# Patient Record
Sex: Female | Born: 1935 | Race: White | Hispanic: No | Marital: Married | Smoking: Never smoker
Health system: Southern US, Community
[De-identification: ages and names within clinical notes are randomized; demographics above are authoritative.]

## PROBLEM LIST (undated history)

## (undated) DIAGNOSIS — K449 Diaphragmatic hernia without obstruction or gangrene: Secondary | ICD-10-CM

## (undated) DIAGNOSIS — M545 Low back pain: Secondary | ICD-10-CM

## (undated) DIAGNOSIS — I10 Essential (primary) hypertension: Secondary | ICD-10-CM

## (undated) DIAGNOSIS — F0390 Unspecified dementia without behavioral disturbance: Secondary | ICD-10-CM

## (undated) DIAGNOSIS — Z950 Presence of cardiac pacemaker: Secondary | ICD-10-CM

## (undated) DIAGNOSIS — E785 Hyperlipidemia, unspecified: Secondary | ICD-10-CM

## (undated) DIAGNOSIS — G8929 Other chronic pain: Secondary | ICD-10-CM

## (undated) DIAGNOSIS — I4891 Unspecified atrial fibrillation: Secondary | ICD-10-CM

## (undated) DIAGNOSIS — N2889 Other specified disorders of kidney and ureter: Secondary | ICD-10-CM

## (undated) DIAGNOSIS — I499 Cardiac arrhythmia, unspecified: Secondary | ICD-10-CM

## (undated) DIAGNOSIS — Z972 Presence of dental prosthetic device (complete) (partial): Secondary | ICD-10-CM

## (undated) DIAGNOSIS — K219 Gastro-esophageal reflux disease without esophagitis: Secondary | ICD-10-CM

## (undated) DIAGNOSIS — M1990 Unspecified osteoarthritis, unspecified site: Secondary | ICD-10-CM

## (undated) DIAGNOSIS — R569 Unspecified convulsions: Secondary | ICD-10-CM

## (undated) DIAGNOSIS — M419 Scoliosis, unspecified: Secondary | ICD-10-CM

## (undated) DIAGNOSIS — Z973 Presence of spectacles and contact lenses: Secondary | ICD-10-CM

## (undated) DIAGNOSIS — K08109 Complete loss of teeth, unspecified cause, unspecified class: Secondary | ICD-10-CM

## (undated) HISTORY — DX: Low back pain: M54.5

## (undated) HISTORY — DX: Hyperlipidemia, unspecified: E78.5

## (undated) HISTORY — PX: SHOULDER ARTHROSCOPY: SHX128

## (undated) HISTORY — PX: ABDOMINAL HYSTERECTOMY: SHX81

## (undated) HISTORY — DX: Essential (primary) hypertension: I10

## (undated) HISTORY — PX: BACK SURGERY: SHX140

## (undated) HISTORY — PX: LUMBAR LAMINECTOMY/DECOMPRESSION MICRODISCECTOMY: SHX5026

## (undated) HISTORY — PX: EYE SURGERY: SHX253

## (undated) HISTORY — PX: APPENDECTOMY: SHX54

## (undated) HISTORY — DX: Other specified disorders of kidney and ureter: N28.89

## (undated) HISTORY — PX: TONSILLECTOMY: SUR1361

## (undated) HISTORY — PX: HEEL SPUR SURGERY: SHX665

## (undated) HISTORY — DX: Unspecified atrial fibrillation: I48.91

## (undated) HISTORY — PX: SPINAL GROWTH RODS: SHX2427

## (undated) HISTORY — PX: HERNIA REPAIR: SHX51

## (undated) HISTORY — DX: Low back pain, unspecified: G89.29

## (undated) HISTORY — PX: KNEE ARTHROSCOPY: SUR90

---

## 1999-11-20 ENCOUNTER — Encounter: Admission: RE | Admit: 1999-11-20 | Discharge: 1999-11-20 | Payer: Self-pay | Admitting: Orthopedic Surgery

## 1999-11-20 ENCOUNTER — Encounter: Payer: Self-pay | Admitting: Orthopedic Surgery

## 2000-06-16 ENCOUNTER — Encounter: Payer: Self-pay | Admitting: Family Medicine

## 2000-06-16 ENCOUNTER — Encounter: Admission: RE | Admit: 2000-06-16 | Discharge: 2000-06-16 | Payer: Self-pay | Admitting: Family Medicine

## 2001-06-17 ENCOUNTER — Encounter: Admission: RE | Admit: 2001-06-17 | Discharge: 2001-06-17 | Payer: Self-pay | Admitting: Family Medicine

## 2001-06-17 ENCOUNTER — Encounter: Payer: Self-pay | Admitting: Family Medicine

## 2002-05-04 ENCOUNTER — Other Ambulatory Visit: Admission: RE | Admit: 2002-05-04 | Discharge: 2002-05-04 | Payer: Self-pay | Admitting: Family Medicine

## 2002-05-07 ENCOUNTER — Encounter: Admission: RE | Admit: 2002-05-07 | Discharge: 2002-05-07 | Payer: Self-pay | Admitting: Family Medicine

## 2002-05-07 ENCOUNTER — Encounter: Payer: Self-pay | Admitting: Family Medicine

## 2002-07-06 ENCOUNTER — Encounter: Admission: RE | Admit: 2002-07-06 | Discharge: 2002-07-06 | Payer: Self-pay | Admitting: Gastroenterology

## 2002-07-06 ENCOUNTER — Encounter: Payer: Self-pay | Admitting: Gastroenterology

## 2002-12-16 ENCOUNTER — Encounter: Admission: RE | Admit: 2002-12-16 | Discharge: 2002-12-16 | Payer: Self-pay | Admitting: Family Medicine

## 2002-12-16 ENCOUNTER — Encounter: Payer: Self-pay | Admitting: Family Medicine

## 2002-12-31 ENCOUNTER — Encounter: Payer: Self-pay | Admitting: Internal Medicine

## 2002-12-31 ENCOUNTER — Inpatient Hospital Stay (HOSPITAL_COMMUNITY): Admission: EM | Admit: 2002-12-31 | Discharge: 2003-01-03 | Payer: Self-pay

## 2003-03-07 ENCOUNTER — Encounter: Payer: Self-pay | Admitting: Family Medicine

## 2003-03-07 ENCOUNTER — Encounter: Admission: RE | Admit: 2003-03-07 | Discharge: 2003-03-07 | Payer: Self-pay | Admitting: Family Medicine

## 2003-03-21 ENCOUNTER — Encounter: Payer: Self-pay | Admitting: Gastroenterology

## 2003-03-21 ENCOUNTER — Ambulatory Visit (HOSPITAL_COMMUNITY): Admission: RE | Admit: 2003-03-21 | Discharge: 2003-03-21 | Payer: Self-pay | Admitting: Gastroenterology

## 2003-04-25 ENCOUNTER — Encounter: Payer: Self-pay | Admitting: Family Medicine

## 2003-04-25 ENCOUNTER — Encounter: Admission: RE | Admit: 2003-04-25 | Discharge: 2003-04-25 | Payer: Self-pay | Admitting: Family Medicine

## 2003-05-02 ENCOUNTER — Encounter: Payer: Self-pay | Admitting: Orthopedic Surgery

## 2003-05-02 ENCOUNTER — Encounter: Admission: RE | Admit: 2003-05-02 | Discharge: 2003-05-02 | Payer: Self-pay | Admitting: Orthopedic Surgery

## 2003-08-09 ENCOUNTER — Encounter: Admission: RE | Admit: 2003-08-09 | Discharge: 2003-08-09 | Payer: Self-pay | Admitting: Family Medicine

## 2003-08-09 ENCOUNTER — Encounter: Payer: Self-pay | Admitting: Family Medicine

## 2003-08-10 ENCOUNTER — Ambulatory Visit (HOSPITAL_BASED_OUTPATIENT_CLINIC_OR_DEPARTMENT_OTHER): Admission: RE | Admit: 2003-08-10 | Discharge: 2003-08-10 | Payer: Self-pay | Admitting: Orthopedic Surgery

## 2003-08-10 HISTORY — PX: TRIGGER FINGER RELEASE: SHX641

## 2003-09-20 ENCOUNTER — Encounter: Admission: RE | Admit: 2003-09-20 | Discharge: 2003-09-20 | Payer: Self-pay | Admitting: Neurosurgery

## 2003-09-20 ENCOUNTER — Encounter: Payer: Self-pay | Admitting: Neurosurgery

## 2003-10-21 ENCOUNTER — Inpatient Hospital Stay (HOSPITAL_COMMUNITY): Admission: RE | Admit: 2003-10-21 | Discharge: 2003-10-26 | Payer: Self-pay | Admitting: Neurosurgery

## 2003-12-06 ENCOUNTER — Encounter: Admission: RE | Admit: 2003-12-06 | Discharge: 2003-12-06 | Payer: Self-pay | Admitting: Neurosurgery

## 2004-06-07 ENCOUNTER — Encounter: Admission: RE | Admit: 2004-06-07 | Discharge: 2004-06-07 | Payer: Self-pay | Admitting: Neurosurgery

## 2004-08-21 ENCOUNTER — Encounter: Admission: RE | Admit: 2004-08-21 | Discharge: 2004-08-21 | Payer: Self-pay | Admitting: Family Medicine

## 2004-09-05 ENCOUNTER — Ambulatory Visit: Payer: Self-pay | Admitting: Pain Medicine

## 2004-09-20 ENCOUNTER — Ambulatory Visit: Payer: Self-pay | Admitting: Pain Medicine

## 2004-10-11 ENCOUNTER — Ambulatory Visit: Payer: Self-pay | Admitting: Physician Assistant

## 2004-10-29 ENCOUNTER — Ambulatory Visit: Payer: Self-pay | Admitting: Physician Assistant

## 2004-11-21 ENCOUNTER — Ambulatory Visit: Payer: Self-pay | Admitting: Pain Medicine

## 2004-11-22 ENCOUNTER — Ambulatory Visit: Payer: Self-pay | Admitting: Pain Medicine

## 2004-12-04 ENCOUNTER — Ambulatory Visit: Payer: Self-pay | Admitting: Pain Medicine

## 2004-12-18 ENCOUNTER — Ambulatory Visit: Payer: Self-pay | Admitting: Physician Assistant

## 2005-01-03 ENCOUNTER — Encounter: Admission: RE | Admit: 2005-01-03 | Discharge: 2005-01-03 | Payer: Self-pay | Admitting: Neurosurgery

## 2005-02-06 ENCOUNTER — Inpatient Hospital Stay (HOSPITAL_COMMUNITY): Admission: RE | Admit: 2005-02-06 | Discharge: 2005-02-12 | Payer: Self-pay | Admitting: Neurosurgery

## 2005-03-28 ENCOUNTER — Encounter: Admission: RE | Admit: 2005-03-28 | Discharge: 2005-03-28 | Payer: Self-pay | Admitting: Neurosurgery

## 2005-07-23 ENCOUNTER — Encounter: Admission: RE | Admit: 2005-07-23 | Discharge: 2005-07-23 | Payer: Self-pay | Admitting: Neurosurgery

## 2005-08-22 ENCOUNTER — Encounter: Admission: RE | Admit: 2005-08-22 | Discharge: 2005-08-22 | Payer: Self-pay | Admitting: Family Medicine

## 2005-09-24 ENCOUNTER — Ambulatory Visit (HOSPITAL_COMMUNITY): Admission: RE | Admit: 2005-09-24 | Discharge: 2005-09-24 | Payer: Self-pay | Admitting: Orthopedic Surgery

## 2006-03-11 IMAGING — CR DG LUMBAR SPINE 2-3V
3 series · 3 of 3 positions shown · non-contrast
Comparison: none

CLINICAL DATA: Back pain.  Fusion.  Follow-up.
 LUMBAR SPINE ? THREE VIEWS
 Three views of the lumbar spine were obtained and compared to films of 12/06/03.  Transpedicular and interbody fusion from L2 to L5 appears stable.  The interbody fusion plugs are in good position and normal in height and the hardware appears intact.  Alignment is unchanged.

 IMPRESSION
 Stable posterior fusion from L2 to L5.  No change in alignment.

[view not recorded (1 of 3)]
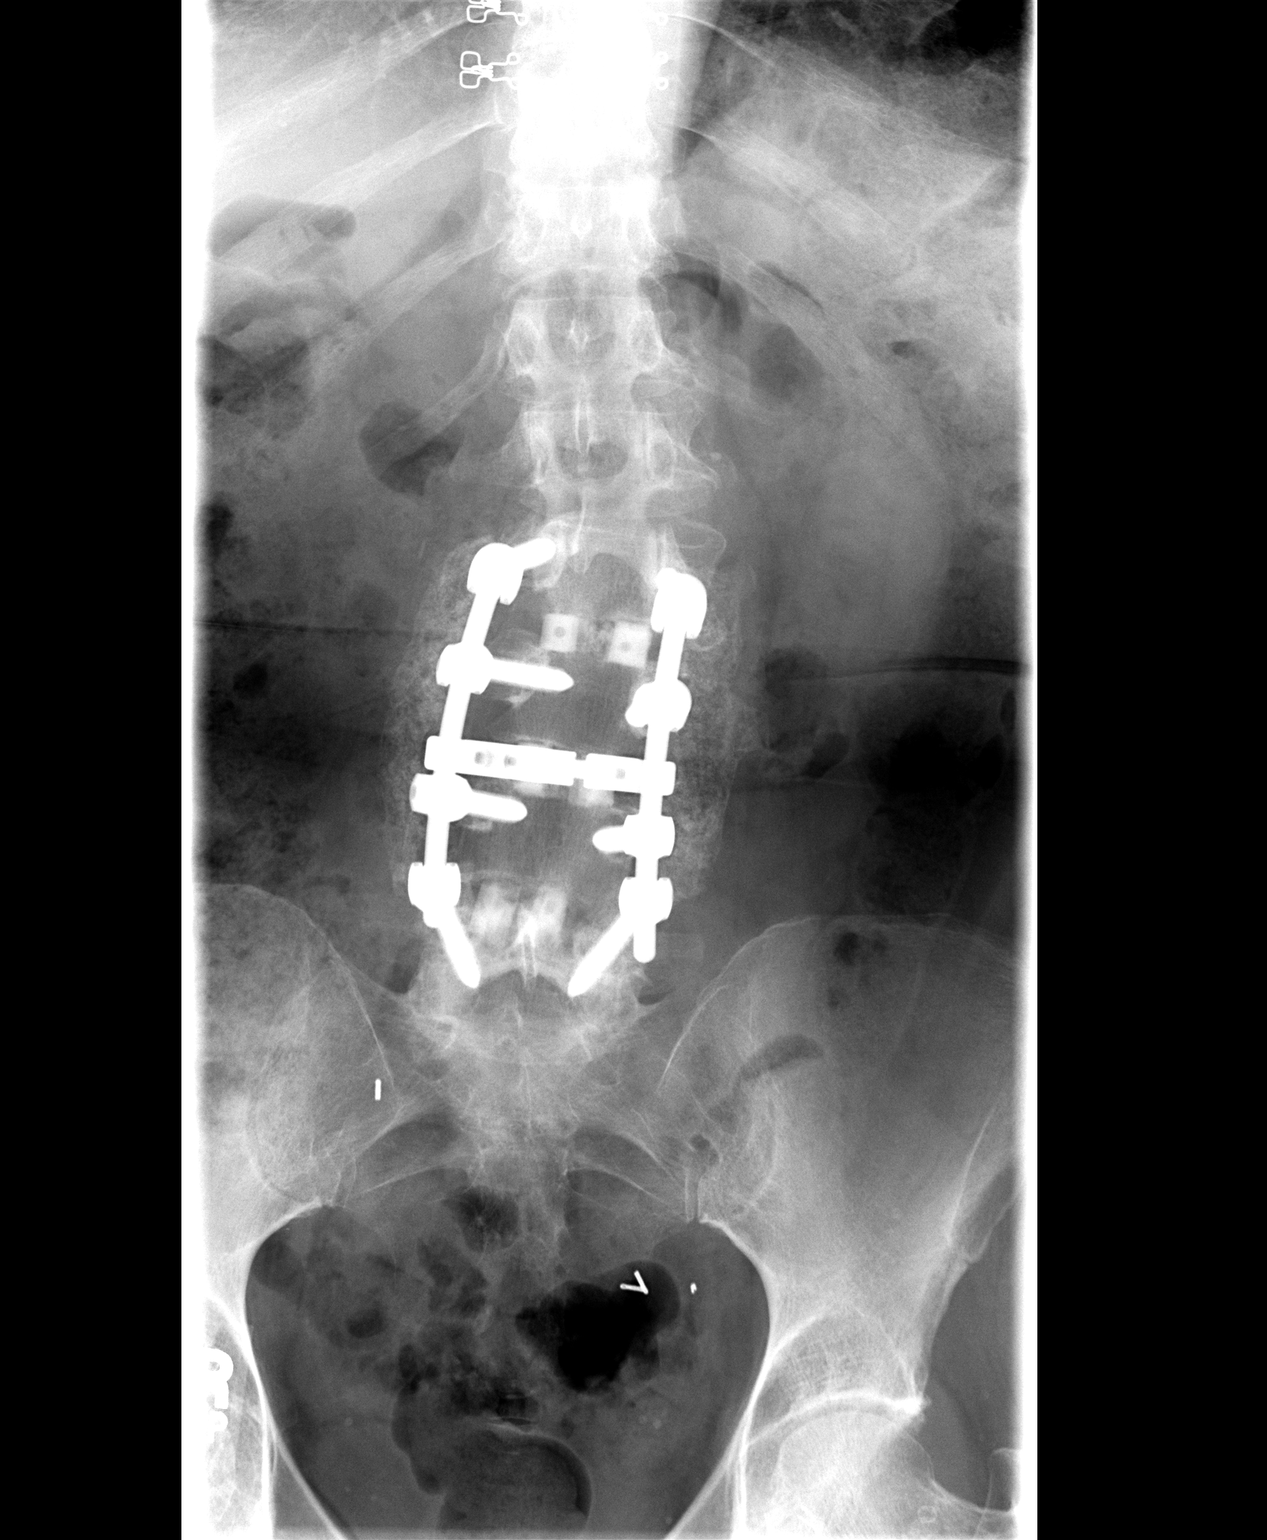

[view not recorded (2 of 3)]
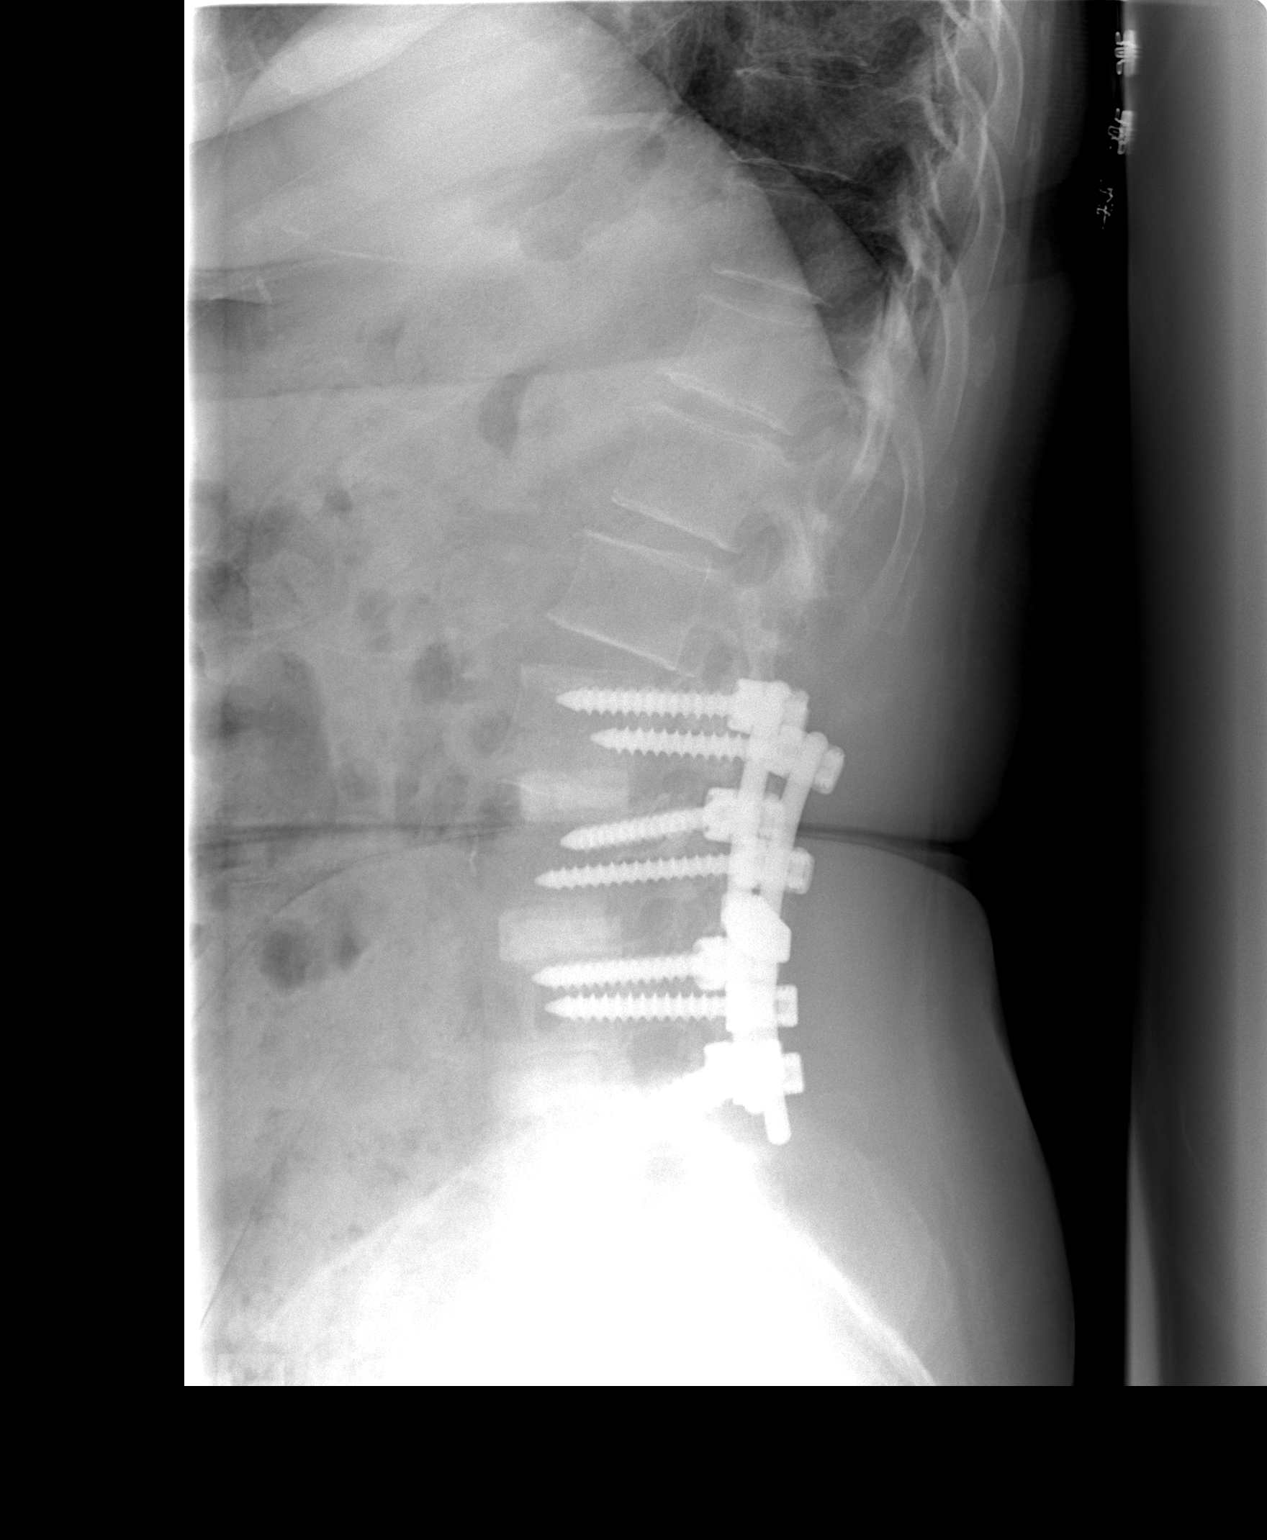

[view not recorded (3 of 3)]
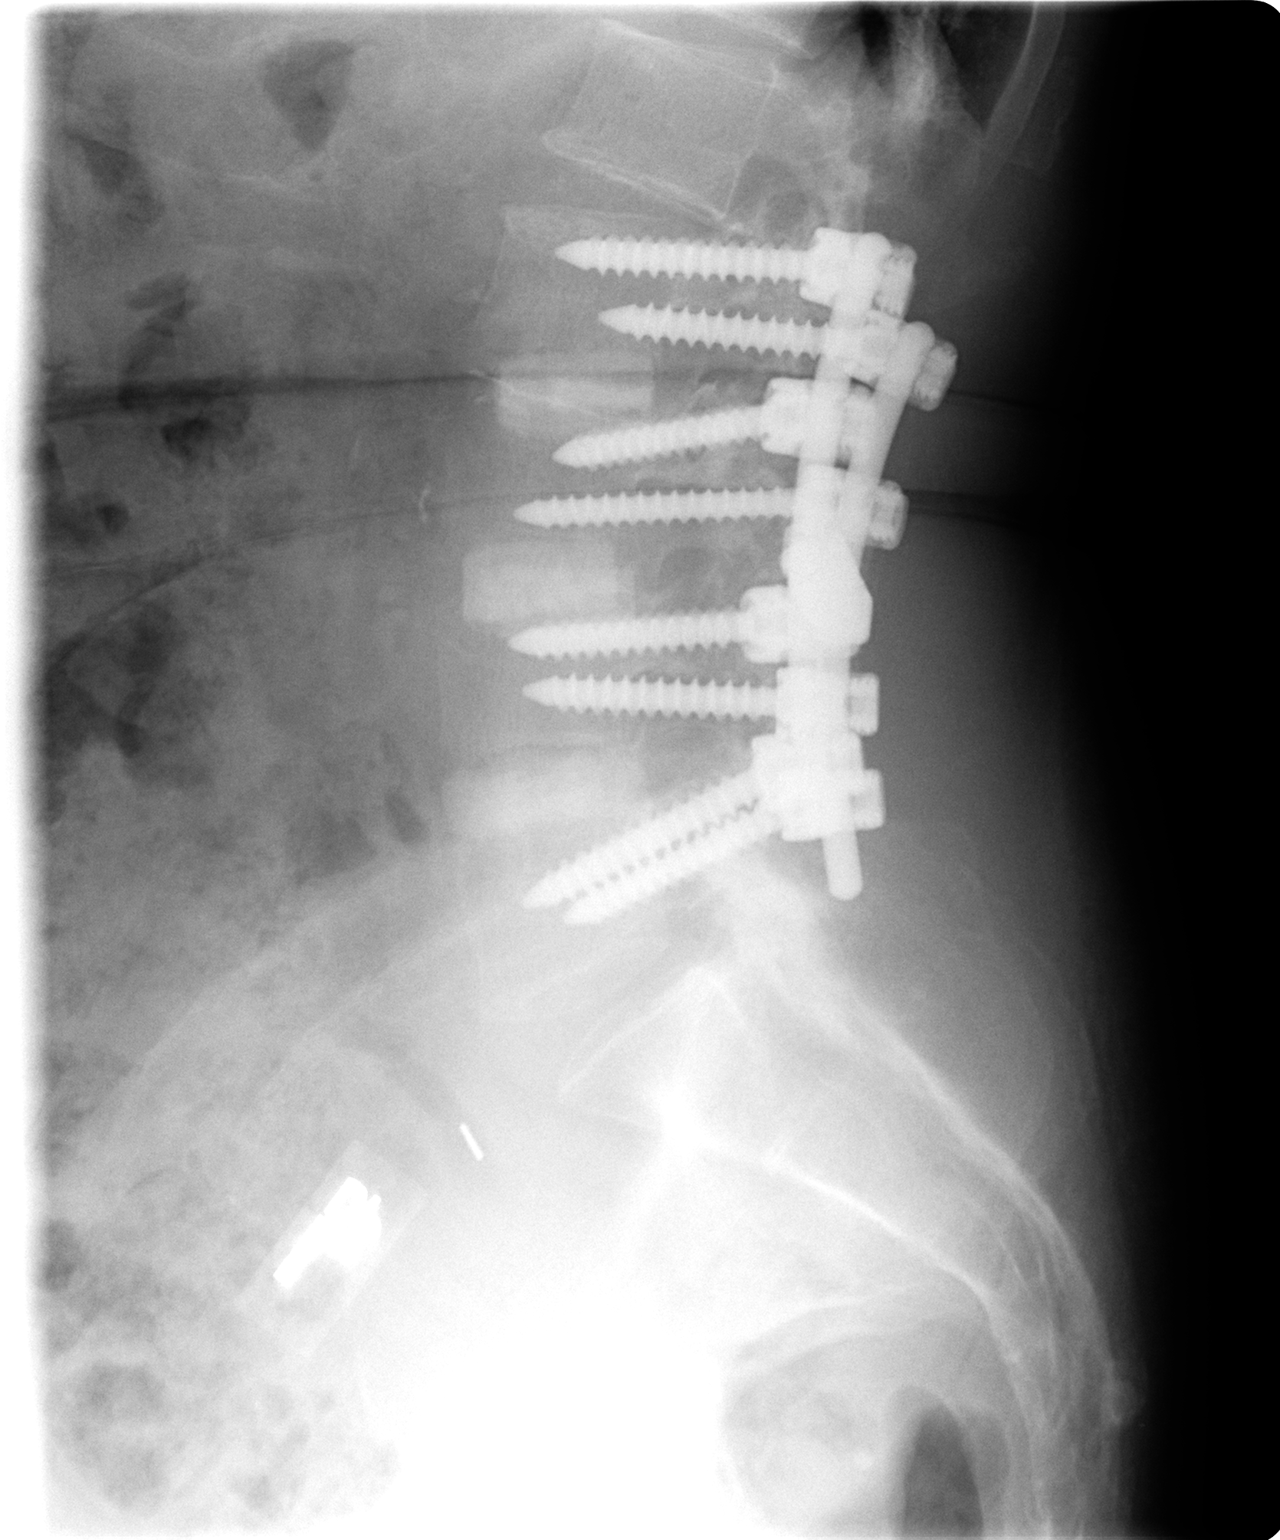

[3 of 3 positions shown; findings below may reference images not displayed]

## 2006-04-15 ENCOUNTER — Ambulatory Visit (HOSPITAL_COMMUNITY): Admission: RE | Admit: 2006-04-15 | Discharge: 2006-04-16 | Payer: Self-pay | Admitting: Orthopedic Surgery

## 2006-08-25 ENCOUNTER — Encounter: Admission: RE | Admit: 2006-08-25 | Discharge: 2006-08-25 | Payer: Self-pay | Admitting: Family Medicine

## 2007-04-25 ENCOUNTER — Emergency Department (HOSPITAL_COMMUNITY): Admission: EM | Admit: 2007-04-25 | Discharge: 2007-04-25 | Payer: Self-pay | Admitting: Emergency Medicine

## 2007-08-20 ENCOUNTER — Encounter: Admission: RE | Admit: 2007-08-20 | Discharge: 2007-08-20 | Payer: Self-pay | Admitting: Neurosurgery

## 2007-08-27 ENCOUNTER — Encounter: Admission: RE | Admit: 2007-08-27 | Discharge: 2007-08-27 | Payer: Self-pay | Admitting: Family Medicine

## 2007-11-18 ENCOUNTER — Inpatient Hospital Stay (HOSPITAL_COMMUNITY): Admission: EM | Admit: 2007-11-18 | Discharge: 2007-11-20 | Payer: Self-pay | Admitting: Emergency Medicine

## 2007-11-24 HISTORY — PX: NM MYOCAR PERF WALL MOTION: HXRAD629

## 2007-12-03 DIAGNOSIS — R569 Unspecified convulsions: Secondary | ICD-10-CM

## 2007-12-03 HISTORY — DX: Unspecified convulsions: R56.9

## 2007-12-09 ENCOUNTER — Inpatient Hospital Stay (HOSPITAL_COMMUNITY): Admission: EM | Admit: 2007-12-09 | Discharge: 2007-12-14 | Payer: Self-pay | Admitting: *Deleted

## 2008-04-22 ENCOUNTER — Inpatient Hospital Stay (HOSPITAL_COMMUNITY): Admission: EM | Admit: 2008-04-22 | Discharge: 2008-04-23 | Payer: Self-pay | Admitting: Emergency Medicine

## 2008-07-08 ENCOUNTER — Inpatient Hospital Stay (HOSPITAL_COMMUNITY): Admission: RE | Admit: 2008-07-08 | Discharge: 2008-07-09 | Payer: Self-pay | Admitting: Cardiology

## 2008-07-08 HISTORY — PX: PERMANENT PACEMAKER INSERTION: SHX6023

## 2008-08-29 ENCOUNTER — Encounter: Admission: RE | Admit: 2008-08-29 | Discharge: 2008-08-29 | Payer: Self-pay | Admitting: Family Medicine

## 2008-08-31 ENCOUNTER — Encounter: Admission: RE | Admit: 2008-08-31 | Discharge: 2008-08-31 | Payer: Self-pay | Admitting: Family Medicine

## 2008-09-07 ENCOUNTER — Inpatient Hospital Stay (HOSPITAL_COMMUNITY): Admission: EM | Admit: 2008-09-07 | Discharge: 2008-09-14 | Payer: Self-pay | Admitting: Emergency Medicine

## 2008-10-11 ENCOUNTER — Ambulatory Visit (HOSPITAL_COMMUNITY): Admission: RE | Admit: 2008-10-11 | Discharge: 2008-10-11 | Payer: Self-pay | Admitting: Family Medicine

## 2009-08-30 ENCOUNTER — Encounter: Admission: RE | Admit: 2009-08-30 | Discharge: 2009-08-30 | Payer: Self-pay | Admitting: Family Medicine

## 2009-11-13 ENCOUNTER — Encounter: Admission: RE | Admit: 2009-11-13 | Discharge: 2009-11-13 | Payer: Self-pay | Admitting: Orthopedic Surgery

## 2009-11-14 ENCOUNTER — Ambulatory Visit (HOSPITAL_BASED_OUTPATIENT_CLINIC_OR_DEPARTMENT_OTHER): Admission: RE | Admit: 2009-11-14 | Discharge: 2009-11-14 | Payer: Self-pay | Admitting: Orthopedic Surgery

## 2009-11-14 HISTORY — PX: CARPAL TUNNEL RELEASE: SHX101

## 2010-07-04 ENCOUNTER — Ambulatory Visit (HOSPITAL_BASED_OUTPATIENT_CLINIC_OR_DEPARTMENT_OTHER): Admission: RE | Admit: 2010-07-04 | Discharge: 2010-07-04 | Payer: Self-pay | Admitting: Orthopedic Surgery

## 2010-07-04 HISTORY — PX: WRIST ARTHROSCOPY: SUR100

## 2010-08-04 ENCOUNTER — Inpatient Hospital Stay (HOSPITAL_COMMUNITY): Admission: EM | Admit: 2010-08-04 | Discharge: 2010-08-08 | Payer: Self-pay | Admitting: Emergency Medicine

## 2010-08-08 ENCOUNTER — Encounter (INDEPENDENT_AMBULATORY_CARE_PROVIDER_SITE_OTHER): Payer: Self-pay | Admitting: Cardiovascular Disease

## 2010-08-08 HISTORY — PX: CARDIOVERSION: SHX1299

## 2010-08-09 ENCOUNTER — Inpatient Hospital Stay (HOSPITAL_COMMUNITY): Admission: EM | Admit: 2010-08-09 | Discharge: 2010-08-10 | Payer: Self-pay | Admitting: Emergency Medicine

## 2010-09-01 LAB — HM MAMMOGRAPHY

## 2010-09-03 ENCOUNTER — Encounter: Admission: RE | Admit: 2010-09-03 | Discharge: 2010-09-03 | Payer: Self-pay | Admitting: Family Medicine

## 2010-11-06 ENCOUNTER — Ambulatory Visit (HOSPITAL_COMMUNITY)
Admission: RE | Admit: 2010-11-06 | Discharge: 2010-11-06 | Payer: Self-pay | Source: Home / Self Care | Admitting: Neurosurgery

## 2011-02-14 LAB — BASIC METABOLIC PANEL
CO2: 23 mEq/L (ref 19–32)
Calcium: 9.1 mg/dL (ref 8.4–10.5)
Chloride: 107 mEq/L (ref 96–112)
GFR calc Af Amer: 55 mL/min — ABNORMAL LOW (ref >60)
GFR calc Af Amer: >60 mL/min (ref >60)
GFR calc non Af Amer: 46 mL/min — ABNORMAL LOW (ref >60)
GFR calc non Af Amer: 50 mL/min — ABNORMAL LOW (ref >60)
Potassium: 4.2 mEq/L (ref 3.5–5.1)
Sodium: 139 mEq/L (ref 135–145)

## 2011-02-14 LAB — CK TOTAL AND CKMB (NOT AT ARMC)
CK, MB: 7.4 ng/mL (ref 0.3–4.0)
Relative Index: 3.1 — ABNORMAL HIGH (ref 0.0–2.5)
Total CK: 236 U/L — ABNORMAL HIGH (ref 7–177)

## 2011-02-14 LAB — CBC
HCT: 32.8 % — ABNORMAL LOW (ref 36.0–46.0)
HCT: 33.8 % — ABNORMAL LOW (ref 36.0–46.0)
HCT: 34 % — ABNORMAL LOW (ref 36.0–46.0)
HCT: 38.3 % (ref 36.0–46.0)
Hemoglobin: 11.4 g/dL — ABNORMAL LOW (ref 12.0–15.0)
Hemoglobin: 11.8 g/dL — ABNORMAL LOW (ref 12.0–15.0)
MCH: 28.2 pg (ref 26.0–34.0)
MCHC: 33.8 g/dL (ref 30.0–36.0)
MCHC: 34.1 g/dL (ref 30.0–36.0)
MCV: 82.6 fL (ref 78.0–100.0)
MCV: 83.5 fL (ref 78.0–100.0)
MCV: 83.9 fL (ref 78.0–100.0)
MCV: 84 fL (ref 78.0–100.0)
Platelets: 185 10*3/uL (ref 150–400)
Platelets: 192 10*3/uL (ref 150–400)
Platelets: 195 10*3/uL (ref 150–400)
Platelets: 214 10*3/uL (ref 150–400)
RBC: 4.05 MIL/uL (ref 3.87–5.11)
RBC: 4.07 MIL/uL (ref 3.87–5.11)
RBC: 4.11 MIL/uL (ref 3.87–5.11)
RBC: 4.16 MIL/uL (ref 3.87–5.11)
RBC: 4.26 MIL/uL (ref 3.87–5.11)
RDW: 13.3 % (ref 11.5–15.5)
RDW: 13.4 % (ref 11.5–15.5)
RDW: 13.4 % (ref 11.5–15.5)
RDW: 13.5 % (ref 11.5–15.5)
WBC: 5.5 10*3/uL (ref 4.0–10.5)
WBC: 5.8 10*3/uL (ref 4.0–10.5)
WBC: 6 10*3/uL (ref 4.0–10.5)
WBC: 8 10*3/uL (ref 4.0–10.5)
WBC: 8.3 10*3/uL (ref 4.0–10.5)
WBC: 8.3 10*3/uL (ref 4.0–10.5)

## 2011-02-14 LAB — COMPREHENSIVE METABOLIC PANEL
ALT: 41 U/L — ABNORMAL HIGH (ref 0–35)
AST: 51 U/L — ABNORMAL HIGH (ref 0–37)
Albumin: 3.9 g/dL (ref 3.5–5.2)
Alkaline Phosphatase: 96 U/L (ref 39–117)
BUN: 16 mg/dL (ref 6–23)
CO2: 24 mEq/L (ref 19–32)
Calcium: 10 mg/dL (ref 8.4–10.5)
Chloride: 106 mEq/L (ref 96–112)
Creatinine, Ser: 1.01 mg/dL (ref 0.4–1.2)
Creatinine, Ser: 1.12 mg/dL (ref 0.4–1.2)
GFR calc Af Amer: 57 mL/min — ABNORMAL LOW (ref >60)
GFR calc non Af Amer: 47 mL/min — ABNORMAL LOW (ref >60)
Glucose, Bld: 118 mg/dL — ABNORMAL HIGH (ref 70–99)
Potassium: 3.7 mEq/L (ref 3.5–5.1)
Sodium: 138 mEq/L (ref 135–145)
Total Bilirubin: 0.5 mg/dL (ref 0.3–1.2)
Total Protein: 7.1 g/dL (ref 6.0–8.3)

## 2011-02-14 LAB — URINALYSIS, ROUTINE W REFLEX MICROSCOPIC
Bilirubin Urine: NEGATIVE
Glucose, UA: NEGATIVE mg/dL
Ketones, ur: NEGATIVE mg/dL
Ketones, ur: NEGATIVE mg/dL
Leukocytes, UA: NEGATIVE
Nitrite: NEGATIVE
Protein, ur: NEGATIVE mg/dL
Specific Gravity, Urine: 1.01 (ref 1.005–1.030)
Urobilinogen, UA: 0.2 mg/dL (ref 0.0–1.0)
Urobilinogen, UA: 1 mg/dL (ref 0.0–1.0)
pH: 7.5 (ref 5.0–8.0)

## 2011-02-14 LAB — POCT I-STAT, CHEM 8
Calcium, Ion: 1.06 mmol/L — ABNORMAL LOW (ref 1.12–1.32)
Calcium, Ion: 1.09 mmol/L — ABNORMAL LOW (ref 1.12–1.32)
Chloride: 107 mEq/L (ref 96–112)
Glucose, Bld: 102 mg/dL — ABNORMAL HIGH (ref 70–99)
Glucose, Bld: 97 mg/dL (ref 70–99)
HCT: 35 % — ABNORMAL LOW (ref 36.0–46.0)
HCT: 39 % (ref 36.0–46.0)
Hemoglobin: 11.9 g/dL — ABNORMAL LOW (ref 12.0–15.0)
TCO2: 21 mmol/L (ref 0–100)
TCO2: 22 mmol/L (ref 0–100)

## 2011-02-14 LAB — PROTIME-INR
INR: 1.52 — ABNORMAL HIGH (ref 0.00–1.49)
INR: 2.86 — ABNORMAL HIGH (ref 0.00–1.49)
INR: 3.52 — ABNORMAL HIGH (ref 0.00–1.49)
INR: 3.94 — ABNORMAL HIGH (ref 0.00–1.49)
Prothrombin Time: 18.5 seconds — ABNORMAL HIGH (ref 11.6–15.2)
Prothrombin Time: 23 seconds — ABNORMAL HIGH (ref 11.6–15.2)
Prothrombin Time: 25.2 seconds — ABNORMAL HIGH (ref 11.6–15.2)
Prothrombin Time: 38.5 seconds — ABNORMAL HIGH (ref 11.6–15.2)

## 2011-02-14 LAB — DIFFERENTIAL
Basophils Absolute: 0 10*3/uL (ref 0.0–0.1)
Basophils Relative: 0 % (ref 0–1)
Eosinophils Absolute: 0.1 10*3/uL (ref 0.0–0.7)
Eosinophils Relative: 3 % (ref 0–5)
Lymphocytes Relative: 28 % (ref 12–46)
Lymphocytes Relative: 28 % (ref 12–46)
Lymphs Abs: 2.3 10*3/uL (ref 0.7–4.0)
Monocytes Absolute: 0.7 10*3/uL (ref 0.1–1.0)
Monocytes Absolute: 1 10*3/uL (ref 0.1–1.0)
Monocytes Relative: 11 % (ref 3–12)
Neutro Abs: 4.9 10*3/uL (ref 1.7–7.7)

## 2011-02-14 LAB — BRAIN NATRIURETIC PEPTIDE
Pro B Natriuretic peptide (BNP): 387 pg/mL — ABNORMAL HIGH (ref 0.0–100.0)
Pro B Natriuretic peptide (BNP): 687 pg/mL — ABNORMAL HIGH (ref 0.0–100.0)

## 2011-02-14 LAB — CARDIAC PANEL(CRET KIN+CKTOT+MB+TROPI)
CK, MB: 5.4 ng/mL — ABNORMAL HIGH (ref 0.3–4.0)
CK, MB: 5.8 ng/mL — ABNORMAL HIGH (ref 0.3–4.0)
CK, MB: 7 ng/mL (ref 0.3–4.0)
Total CK: 139 U/L (ref 7–177)
Total CK: 171 U/L (ref 7–177)
Total CK: 201 U/L — ABNORMAL HIGH (ref 7–177)
Troponin I: 0.02 ng/mL (ref 0.00–0.06)
Troponin I: 0.03 ng/mL (ref 0.00–0.06)
Troponin I: 0.06 ng/mL (ref 0.00–0.06)

## 2011-02-14 LAB — APTT
aPTT: 32 seconds (ref 24–37)
aPTT: 44 seconds — ABNORMAL HIGH (ref 24–37)

## 2011-02-14 LAB — AMYLASE: Amylase: 45 U/L (ref 0–105)

## 2011-02-14 LAB — URIC ACID: Uric Acid, Serum: 8.1 mg/dL — ABNORMAL HIGH (ref 2.4–7.0)

## 2011-02-14 LAB — LIPID PANEL
Cholesterol: 178 mg/dL (ref 0–200)
HDL: 51 mg/dL (ref >39)
Total CHOL/HDL Ratio: 3.5 RATIO
Triglycerides: 76 mg/dL (ref <150)

## 2011-02-14 LAB — POCT CARDIAC MARKERS: Myoglobin, poc: 127 ng/mL (ref 12–200)

## 2011-02-14 LAB — HEPARIN LEVEL (UNFRACTIONATED)
Heparin Unfractionated: 0.5 IU/mL (ref 0.30–0.70)
Heparin Unfractionated: 0.53 IU/mL (ref 0.30–0.70)
Heparin Unfractionated: 0.93 IU/mL — ABNORMAL HIGH (ref 0.30–0.70)

## 2011-02-14 LAB — TSH
TSH: 1.49 u[IU]/mL (ref 0.350–4.500)
TSH: 2.291 u[IU]/mL (ref 0.350–4.500)

## 2011-02-14 LAB — LACTIC ACID, PLASMA: Lactic Acid, Venous: 1.2 mmol/L (ref 0.5–2.2)

## 2011-02-14 LAB — URINE MICROSCOPIC-ADD ON

## 2011-02-14 LAB — TROPONIN I: Troponin I: 0.04 ng/mL (ref 0.00–0.06)

## 2011-02-15 LAB — BASIC METABOLIC PANEL
BUN: 18 mg/dL (ref 6–23)
CO2: 27 mEq/L (ref 19–32)
Glucose, Bld: 94 mg/dL (ref 70–99)
Potassium: 4.3 mEq/L (ref 3.5–5.1)
Sodium: 141 mEq/L (ref 135–145)

## 2011-02-15 LAB — POCT HEMOGLOBIN-HEMACUE: Hemoglobin: 11.6 g/dL — ABNORMAL LOW (ref 12.0–15.0)

## 2011-02-15 LAB — APTT: aPTT: 29 seconds (ref 24–37)

## 2011-03-05 LAB — PROTIME-INR
INR: 2.35 — ABNORMAL HIGH (ref 0.00–1.49)
Prothrombin Time: 25.5 seconds — ABNORMAL HIGH (ref 11.6–15.2)

## 2011-03-05 LAB — BASIC METABOLIC PANEL
GFR calc Af Amer: 40 mL/min — ABNORMAL LOW (ref >60)
GFR calc non Af Amer: 33 mL/min — ABNORMAL LOW (ref >60)
Potassium: 4.2 mEq/L (ref 3.5–5.1)
Sodium: 139 mEq/L (ref 135–145)

## 2011-03-05 LAB — POCT HEMOGLOBIN-HEMACUE: Hemoglobin: 10.2 g/dL — ABNORMAL LOW (ref 12.0–15.0)

## 2011-03-05 LAB — APTT: aPTT: 44 seconds — ABNORMAL HIGH (ref 24–37)

## 2011-04-16 NOTE — Discharge Summary (Signed)
Heather [name], Heather [name]                 ACCOUNT NO.:  [id number]   MEDICAL RECORD NO.:  [id number]          PATIENT TYPE:  INP   LOCATION:  3708                         FACILITY:  [facility]   PHYSICIAN:  Cristy Hilts. Jacinto Halim, MD       DATE OF BIRTH:  1933/07/21   DATE OF ADMISSION:  04/22/2008  DATE OF DISCHARGE:  04/23/2008                               DISCHARGE SUMMARY   HISTORY OF PRESENT ILLNESS:  [name] is a 77 year old female,  patient of Dr. Yates Decamp, who has a history of PAF and normal LV  function without LVH.  She apparently has had a Myoview scan that was  also negative.  She came to the hospital because of sharp chest pain.  She was seen by Dr. Nanetta Batty and she was admitted to rule out an  MI.  She was given a Lovenox injection because her INR was  subtherapeutic.  Her creatinine was also noted to be elevated.  The  following day she was seen by Dr. Jacinto Halim, her primary cardiologist, and  her CK-MBs and troponins were negative.  Her BNP was 605.  Her INR was  2.0.  He felt she was stable for discharge home.  He did feel that she  would require a pacemaker electively.  She has had episodes of tachy and  significant bradycardia.  She has also had a couple of episodes of  syncope without any etiology.  She has had generalized weakness.  Apparently, EMS recorded a heart rate in the 40s.  Thus, we will  schedule an appointment to see Dr. Lynnea Ferrier as an outpatient for a  pacemaker.  She will need to stop her Coumadin for 3 days and then put  in the hospital on the fourth day for pacemaker implantation.  We should  also hold her ACE and her diuretics for now because of her serum  creatinine being elevated.  She will have her lab checked on Tuesday.  If her serum creatinine is stable, she can restart her benazepril.  CK-  MB and troponins were all negative.  Her ferritin was 334, her B12 was  248, her folate was greater than 20, her TIBC was 280, iron was 122,  percent sat was 44; all  within normal limits.  Her BNP, sodium was 142,  potassium 4, her glucose was 89, chloride 112, CO2 of 23, BUN 25, and  creatinine 1.69.  BNP was 605, TSH was 1.71, magnesium was 1.8.  Chest x-  ray shows cardiomegaly and pulmonary vascular congestion without edema,  mild right basilar atelectasis.   DISCHARGE MEDICATIONS:  1. Tramadol 50 mg every 6 hours as needed p.r.n.  2. Nexium 40 mg a day.  3. Metoprolol succinate 200 mg a day.  4. Namenda 10 mg twice a day.  5. Hydrochlorothiazide 25 mg 1/2 tablet every morning, it is on hold.  6. Fish oil daily.  7. Cartia XT 240 mg one every day.  8. Coumadin 2.5 mg daily Tuesday through Saturday and Monday half a      tablet.  9. Benazepril will  be on hold 1/2 tablet twice daily.    We will call her with an appointment to have her pacemaker placed.  She  will first see Dr. Lynnea Ferrier in the office.  I have told her to hold her  benazepril and hydrochlorothiazide, and she will need to have her blood  checked on Tuesday for her kidney functions.   DISCHARGE DIAGNOSES:  1. Chest pain, noncardiac.  2. Sick sinus syndrome/paroxysmal atrial fibrillation, symptomatic      with weakness and episodes of syncope.  3. Dementia.  4. Hypertension.  5. Anticoagulation.  6. Persantine Myoview done, November 24, 2007, that showed no      ischemia.  7. Normal ejection fraction 82% by quantitative gated SPECT.       Lezlie Octave, N.P.      Cristy Hilts. Jacinto Halim, MD  Electronically Signed    BB/MEDQ  D:  04/23/2008  T:  04/23/2008  Job:  [id number]   cc:   Linward Foster, MD

## 2011-04-16 NOTE — Procedures (Signed)
EEG NUMBER:  [id number]   Read by Dr. Sharon Seller, done on December 11, 2007.  The patient was described  clinically as being asleep.  She is a 77 year old woman who was admitted  with altered mental status, hypokalemia, hyponatremia, leukocytosis,  possible seizures, new onset AFib, hypertension and GERD.  She had had 2  episodes of possible seizure.  This is a portable 17-channel EEG with  one channel devoted to EKG, utilizing International 10/20 lead placement  system.  The patient was described clinically as being asleep.  It  appeared to be that way electrographically.  The tech did not awaken the  patient during the procedure.  Therefore, the entire study appears to be  sleep with an attenuated background with theta and delta activity with  frequent beta activity consistent with sleep spindles and some small  sharp waves centrally, and K complexes all consistent with fairly normal  sleep architecture in the light natural sleep state.  No  interhemispheric asymmetry is identified, and no definite epileptiform  discharges are seen.  Activation procedures were not performed.  The EKG  monitor reveals a relatively mildly irregular rhythm with a rate of  around 96 beats per minute.   CONCLUSION:  This is essentially a normal EEG in the sleeping state,  however, the patient was never awaked during the course of recording, so  her waking background is unclear.  There are no focal abnormalities or  seizure activity seen during the course of today's recording.  Clinical  correlation is recommended.      Catherine A. Orlin Hilding, M.D.  Electronically Signed     ZOX:WRUE  D:  12/11/2007 20:35:44  T:  12/12/2007 10:39:29  Job #:  [id number]

## 2011-04-16 NOTE — Discharge Summary (Signed)
NAMEANIAH, PAULI                 ACCOUNT NO.:  [id number]   MEDICAL RECORD NO.:  [id number]          PATIENT TYPE:  INP   LOCATION:  4728                         FACILITY:  [facility]   PHYSICIAN:  Cristy Hilts. Jacinto Halim, MD       DATE OF BIRTH:  23-Apr-1933   DATE OF ADMISSION:  11/18/2007  DATE OF DISCHARGE:  11/20/2007                               DISCHARGE SUMMARY   [name] is a 77 year old female patient of Dr. Jacinto Halim, who has seen  him on November 13, 2007 for the first time as a referral from Dr.  Dellis Anes. She had  new onset atrial fibrillation.  Her  medications were adjusted.  She was already on Coumadin, being followed  at [facility].  There were plans for her to have an  echocardiogram and a Persantine Myoview.  Well, apparently there was  confusion about her medications, and she apparently took the ones that  were discontinued and also the new ones.  She came in  with  some  weakness and slurred speech; it was thought to be secondary to too much  medication.  She had some transient bradycardia thought secondary to her  medications.  Thus, her medications during her hospitalizations were  adjusted again.  Her mental status improved.  However, her husband tells  me at the time of discharge that she has short-term memory loss.  She  has not had a stroke that he knows of.  Apparently he is going to take  over giving her medications.   She was seen by Dr. Clarene Duke on the day of discharge; considered stable  for discharge home.  Her blood pressure was 118/72, her pulse was 75,  respirations were 17, temperature was 97.  Her INR on the day of  discharge was 1.8.  She was given an injection of Lovenox prior to her  discharge.  Her hemoglobin was 12.0, hematocrit 36.8, WBCs 8.7,  platelets 248.  Sodium 141, potassium 4.4, chloride 108, CO2 25, BUN 19,  creatinine 1.36, glucose 93.  AST and ALT, and other liver functions  were all normal.  CK-MBs and troponins  were negative.  Her total  cholesterol was 187, LDL was 121, Her HDL was 49; triglycerides 83.  Calcium was 9.4.  TSH was 0.67.   A chest x-ray showed mild cardiomegaly without acute disease.  CT of her  head showed no acute intracranial abnormalities.  She had mild chronic  small vessel ischemic disease and brain atrophy.   DISCHARGE DIAGNOSES:  1. Weakness and slurred speech, secondary to probable overdosage of      medication  2. New onset of atrial fibrillation, unknown duration; with plans for      a DC cardioversion after INR therapeutic in 3-4 weeks.  We will      follow her INR until the cardioversion is done.  3. Hypertension.  4. Anticoagulation.  5. Gastroesophageal reflux disease.   DISCHARGE MEDICATIONS:  1. Nexium 40 mg a day.  2. Toprol XL 100 mg a day.  3. Diltiazem CD 180 mg  a day.  4. Mirtazapine 30 mg at bedtime.  5. Benazepril 20 mg a day.  6. Coumadin 5 mg a day; except Sunday, Tuesday and Thursday.  She      should take 2.5 mg at 5 or 6 p.m. every night.   She is to stop her Lotrel, Coreg and clonazepam.  She will come back to  the [facility] on November 21, 2007 on the second floor.  She will  have a nuclear test at 12:45 on the second floor, and she will have an  echocardiogram at 3:00 p.m. on the second floor.  She should follow the  same instructions as done previously.      Lezlie Octave, N.P.      Cristy Hilts. Jacinto Halim, MD  Electronically Signed    BB/MEDQ  D:  11/20/2007  T:  11/21/2007  Job:  [id number]   cc:   Broadus John T. Pamalee Leyden, MD

## 2011-04-16 NOTE — Discharge Summary (Signed)
NAMECAFFIE, SOTTO NO.:  [id number]   MEDICAL RECORD NO.:  [id number]          PATIENT TYPE:  INP   LOCATION:  2040                         FACILITY:  [facility]   PHYSICIAN:  Lonia Blood, M.D.DATE OF BIRTH:  1933-09-26   DATE OF ADMISSION:  12/09/2007  DATE OF DISCHARGE:  12/14/2007                               DISCHARGE SUMMARY   PRIMARY CARE PHYSICIAN:  [employer].   CARDIOLOGIST:  Cristy Hilts. Jacinto Halim, MD.   Deatra JamesSantina Evans A. Orlin Hilding, M.D.   DISCHARGE DIAGNOSES:  1. Possible seizure activity.      a.     MRI/MRA negative.      b.     EEG unrevealing.      c.     Possibility of tonic-clonic type spasms, as recovery from       temporary potentially fatal arrhythmia.      d.     Ongoing observation without anti-epileptics indicated.  2. Atrial fibrillation.      a.     Ongoing follow-up, Dr. Jacinto Halim.      b.     Coumadin therapy.  3. Status post hysterectomy.  4. Status post appendectomy.  5. Status post tonsillectomy and adenoidectomy.  6. Hypertension.  7. Gastroesophageal reflux disease.  8. Nephrolithiasis.  9. Newly diagnosed dementia -- Namenda initiated.   DISCHARGE MEDICATIONS:  1. Namenda 5 mg one p.o. daily x3 days, then one p.o. b.i.d. x7 days,      then 5 mg in the morning and 10 mg in the evening x7 days, then 10      mg daily.  2. Nexium 40 mg daily.  3. Toprol XL 100 mg daily.  4. Diltiazem CD 180 mg daily.  5. Mirtazapine 30 mg q.h.s.  6. Benazepril 20 mg daily.  7. Coumadin 2.5 mg daily.   FOLLOWUP:  1. The patient is to follow up with [facility]      for ongoing checks of her Coumadin/INR.  2. The patient is to call Dr. Bethann Goo office for a follow-up at [phone] for the next available appointment.   PROCEDURES:  1. MRI/MRA -- No evidence of acute stroke or other anatomical      abnormalities.  2. EEG -- No evidence of epileptica in foci.   CONSULTATIONS:  1. Dr.  Marcelino Freestone, neurology.  2. Dr. Yates Decamp, [facility].   HOSPITAL COURSE:  Ms. Meria Crilly is a very pleasant 77 year old female  who was admitted on December 09, 2007 after she presented to the hospital  with altered mental status.  The patient's husband described a very  clear tonic-clonic type seizure, after which the patient was extremely  hard to arouse.  When she presented to the ER she was in fact non-  communicative and appeared to be post-ictal.  Her atrial fibrillation  was present, but remained controlled.  The patient was admitted to the  acute unit.  She was followed closely on telemetry.  No severe  arrhythmia short of her atrial  fibrillation was encountered.  Her  Coumadin was titrated appropriately, to maintain her INR within a  therapeutic range.  MRI and MRA were accomplished, and failed to reveal  any evidence of an acute intracranial abnormality.  EEG was accomplished  and did not reveal any evidence of an epileptica in foci.  Dr Orlin Hilding  was consulted for evaluation.  It was agreed upon by all physicians  involved that this could in fact represent a seizure, but it was not  felt that anti-epileptics were appropriate at this time; unless the  patient suffered a recurrence.  No such occurrence was encountered  during the hospital stay.   They were concerns over the patient's deconditioning.  Physical therapy  was asked to see the patient.  They felt that the patient was, in fact,  stable for discharge home; with outpatient physical therapy arranged at  the house.  The patient denied this care.   She was cleared for discharge on December 14, 2007, with follow-up as  detailed above.  It should be noted that at the time of her discharge  her INR is 2.5.      Lonia Blood, M.D.  Electronically Signed     JTM/MEDQ  D:  12/14/2007  T:  12/14/2007  Job:  [id number]   cc:   Olena Leatherwood [facility] R. Jacinto Halim, MD  Gustavus Messing  Orlin Hilding, M.D.

## 2011-04-16 NOTE — Consult Note (Signed)
NAMEKATURAH, KARAPETIAN                 ACCOUNT NO.:  [id number]   MEDICAL RECORD NO.:  [id number]          PATIENT TYPE:  INP   LOCATION:  2040                         FACILITY:  [facility]   PHYSICIAN:  Santina Evans A. Orlin Hilding, M.D.DATE OF BIRTH:  1933-08-08   DATE OF CONSULTATION:  12/11/2007  DATE OF DISCHARGE:                                 CONSULTATION   CHIEF COMPLAINT:  Seizure.   HISTORY OF PRESENT ILLNESS:  Heather [name] is a 77 year old white woman  with past medical history of fairly recently diagnosed atrial  fibrillation who was just started on Coumadin 2 weeks ago.  She is  supposed to have cardioversion in January of this year.  She also has  mild cognitive impairment or mild dementia at baseline for several  years.  She was brought into the emergency room after she had an episode  on the morning of admission.  She had apparently been up the night  before with some nausea and vomiting, with retching, not sleeping all  night long.  She got up in the morning and just dropped to the floor  with acute onset of what was described as violent tonic-clonic  activity.  Her eyes were rolling back, and she had turned purple.  There was no incontinence or tongue-biting described, however.  This  lasted for several minutes.  She was snoring, quite somnolent, and  somewhat agitated intermittently for the next half hour and after the  next 12 hours remained more agitated and at her baseline.  That finally  has cleared.  In retrospect, it appeared she had had a similar episode  about 3 weeks before that where she presented to the emergency room with  altered mentation after an event with shaking which was attributed at  that time to an accidental overdose of her medications.  There is no  known history of stroke, but she has been noted to have some right  facial droop and some right ptosis or pseudoptosis or squint which is  new in the last day or two.  She has never had a head injury, never had  a stroke that she is aware of, never had a previous seizure other than  theses last two events that were described.   REVIEW OF SYSTEMS:  A 12-system review is positive for the nausea,  vomiting, some subjective fever and chills although none words were  documented, confusion which is baseline, some weight loss due to loss of  appetite, but no dizziness, blurry vision, double vision, headache or  pains anywhere.  No clear unilateral symptoms that patient reports  herself.   PAST MEDICAL HISTORY:  Is significant for the recently diagnosed atrial  fibrillation December 2008, remote hysterectomy, remote appendectomy,  remote tonsillectomy, adenoidectomy hypertension, nephrolithiasis  without definite renal failure, GERD,  remote lumbar spine surgery.  She  uses a cane and some rotator cuff surgery.   MEDICATIONS:  On admission were Coumadin.  Her INR was 4.7 today.  On  admission it was, 2.7.  So she certainly was therapeutic but not  supratherapeutic on admission, although she is  supratherapeutic now.  Nexium, Toprol XL, Diltiazem, Remeron, Benazepril were her medicines as  an outpatient.  Again, currently, she is on Tylenol, diltiazem,  lisinopril, lorazepam, Lopressor, Protonix and the warfarin.   ALLERGIES:  TO CODEINE, OXYCONTIN SULFA AND ASPIRIN.   SOCIAL HISTORY:  She lives in [place].  Does not smoke, does not  drink.  Has two children.   FAMILY HISTORY:  Is unremarkable.  No seizures.   OBJECTIVE:  On exam, vital signs:  Temperature is 97.7, T-max was 99.38,  pulse 85, respirations 20, BP 119/72, 96% saturation on room air.  HEAD:  Is normocephalic, atraumatic.  NECK:  Is supple without bruits.  There is no tongue laceration.  HEART:  Is slightly irregular but not prominently so.  LUNGS:  Are clear.  NEUROLOGIC EXAM:  Mental status:  She is awake, alert, pleasant and  cooperative but not fully oriented.  She could not tell me the date or  place.  She is able follow  commands.  Language is intact.  Cranial nerve  exam:  Pupils are equal and reactive.  There is no sclerae asymmetry.  She does seem to have slight right ptosis or squint.  Extraocular  movements are intact.  Visual fields are intact inferiorly, looks like  she has bilateral superior cuts versus pseudoptosis interfering with  that.  She has intact sensation with a definite right facial droop of a  central nature.  There is no drift in the left upper extremity, minimal  drift in the right upper extremity.  No drift in either lower extremity.  No ataxia on finger-nose or heel-to-shin.  Sensory is normal without any  extinction.  No dysarthria or aphasia is noted.  Her stroke scale score  would be a 4 based on the disorientation and the facial droop, although  I think disorientation is due to an underlying dementia rather than a  language problem or confusion or altered loss of consciousness.  MRI of  the brain was done as motion degraded but negative for any definitive  diffusion weighted positive stroke and otherwise fairly normal on FLAIR  and T2 for her age.  EEG is pending.  INR is noted at 4.7.   IMPRESSION:  Seizure by description with possible episode 3 weeks prior,  in the setting of new onset atrial fibrillation, now on Coumadin with a  supratherapeutic INR and reportedly new right facial droop.  She  certainly is at risk for embolic stroke or TIA which could precipitate a  seizure.   RECOMMENDATIONS:  I agree with an EEG and repeat MRI.  I would not treat  her with anticonvulsants at this time but would have a low threshold if  the EEG shows definite seizure activity or she has a recurrent seizure.  I would recommend using Keppra starting at 500 b.i.d. and increasing to  a maximum of 1000 b.i.d.  No driving.  Will follow up on the repeat MRI  and on the EEG.      Catherine A. Orlin Hilding, M.D.  Electronically Signed     CAW/MEDQ  D:  12/11/2007  T:  12/11/2007  Job:  [id number]

## 2011-04-16 NOTE — H&P (Signed)
NAMEGEENA, Heather [name]:  [id number]   MEDICAL RECORD NO.:  [id number]          PATIENT TYPE:  INP   LOCATION:  1823                         FACILITY:  [facility]   PHYSICIAN:  Vania Rea, M.D. DATE OF BIRTH:  05-14-1933   DATE OF ADMISSION:  09/07/2008  DATE OF DISCHARGE:                              HISTORY & PHYSICAL   PRIMARY CARE PHYSICIAN:  Broadus John T. Pamalee Leyden, M.D., [facility].   CHIEF COMPLAINT:  Fever, cough, and cold x5 days.   HISTORY OF PRESENT ILLNESS:  This is a 77 year old Caucasian lady with a  history of hypertension and atrial fibrillation, status post recent  pacemaker placement, who was in her baseline state of health until five  days ago.  She developed fever and a persistent cough.  Initially  treated with cough medicine phoned in by her primary care physician,  then later after chest x-ray, felt it was a pneumonia and treated her  with shots of Rocephin and Z-pack.  The patient got worse and then blood  cultures done at the initial visit was returned with Gram positive cocci  and the patient was advised to come in for hospitalization.  We also  note from reviewing her pharmacy medication list that cephalexin was  prescribed for her about 10 days ago by her cardiologist but it seems  there was only a 5-day supply.   Apart from the persistent cough and discomfort the patient denies any  other acute symptoms.  Although the cough is very distressing it is  nonproductive.   PAST MEDICAL HISTORY:  1. Sick sinus syndrome.  2. Atrial fibrillation, status post pacemaker placement in August      2009.  3. Chronic Coumadin therapy.  4. Hypertension.  5. Remote history of back surgery with rod placement in the sacral      lumbar area.  6. Bilateral cataract surgery in March 2009.  7. History of left knee surgery with unstable left knee.  8. Mild dementia.   MEDICATIONS:  1. Lasix 20 mg daily.  2. Benazepril 40 mg  daily.  3. Iron sulfate 325 mg twice daily.  4. Lopressor 25 mg twice daily.  5. Namenda 10 mg twice daily.  6. Z-pack.  7. Coumadin 2.5 mg daily.  8. Amiodarone 200 mg daily.  9. Amlodipine 5 mg daily.   ALLERGIES:  1. OXYCONTIN causes acute psychotic rapture.  2. She is also allergic to ASPIRIN.  3. CODEINE.  4. SULFA.   SOCIAL HISTORY:  Denies tobacco or alcohol or illicit drug use.  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   FAMILY HISTORY:  Represented by coronary artery disease, otherwise  unremarkable.  There is no history of sick contacts.   REVIEW OF SYSTEMS:  Other than noted above, a 10-point review of systems  significant only for episodic constipation.  Gait and balance:  Due to  her left knee she walks with a walker and episodic back pains.   PHYSICAL EXAMINATION:  GENERAL:  Ill-looking, elderly Caucasian lady  lying in bed, coughing violently and persistently.  VITAL SIGNS:  Her temperature is 99, her pulse is 66, respirations 20,  blood pressure is 175/68.  She is saturating at 94% on room air.  HEENT:  Her pupils are round and equal.  Mucous membranes are pink.  She  is mildly dehydrated.  NECK:  No cervical lymphadenopathy or thyromegaly.  CHEST:  She has diffuse coarse rhonchi.  CARDIOVASCULAR:  Regular rhythm.  ABDOMEN:  Soft and nontender.  There are no masses.  EXTREMITIES:  Without edema.  She has 3+ pulses bilaterally.  She has  arthritic deformities of both knees.  She has no edema.  CENTRAL NERVOUS SYSTEM:  Cranial nerves III-XII are grossly intact and  she has no lateralizing signs.   LABORATORY DATA:  Her white count is 5.2, hemoglobin 10.1, MCV 6.7,  platelets 170.  Her sodium is 140, potassium 3, chloride 105, CO2 25,  BUN 11, creatinine 1.17.  Her albumin is 2.9.  Her liver function tests  are otherwise unremarkable.  Her INR is 4.8.  Urinalysis:  Specific  gravity is  1.025, negative for nitrites, moderate amount of leukocytes.  Esterase contaminated by epithelial cells, 3-6 white cells, no red  cells, rare bacteria.   Chest x-ray shows patchy areas of infiltrate in the right lung and a  small left lower lobe infiltrate.   ASSESSMENT:  1. Bilateral pneumonia.  2. Hypertension, uncontrolled.  3. Coumadin coagulopathy.  4. Hypokalemia secondary to Lasix.  5. History of Gram positive bacteremia.  6. History of paroxysmal atrial fibrillation.  7. Sick sinus syndrome.  8. Status post pacemaker placement.   PLAN:  1. Will admit this lady to the telemetry unit for intravenous      antibiotics.  Will give vancomycin and Avelox for      the time being and will go ahead and repeat cultures.  Will      withhold IV fluids but will also withhold Lasix.  2. Will replace her potassium.  3. Other plans as per orders.      Vania Rea, M.D.  Electronically Signed     LC/MEDQ  D:  09/07/2008  T:  09/08/2008  Job:  [id number]   cc:   Broadus John T. Pamalee Leyden, MD  Cristy Hilts Jacinto Halim, MD

## 2011-04-16 NOTE — H&P (Signed)
[name], [name]:  [id number]   MEDICAL RECORD NO.:  [id number]          PATIENT TYPE:  INP   LOCATION:  1848                         FACILITY:  [facility]   PHYSICIAN:  Lonia Blood, M.D.DATE OF BIRTH:  [birth year]   DATE OF ADMISSION:  12/09/2007  DATE OF DISCHARGE:                              HISTORY & PHYSICAL   PRIMARY CARE PHYSICIAN:  [employer].   PRIMARY CARDIOLOGIST:  Cristy Hilts. Jacinto Halim, MD.   CHIEF COMPLAINT:  Altered mental status with questionable seizure  activity.   HISTORY OF PRESENT ILLNESS:  [name] is a very pleasant, 77-year-  old female with a medical history as detailed below. Approximately 3  weeks ago, she was evaluated in the emergency room for altered mental  status. At that time, it was believed that the patient had taken an  accidental overdose of one of her medications and that it had lead to  her symptoms. The patient in the meantime has undergone a workup for  atrial fibrillation per Dr. Jacinto Halim. This workup has proceeded nicely.  Last night however the patient began to develop intractable nausea with  vomiting. She vomited nothing but clear fluid per her husband and she  spent the majority of the night retching over and over. There were no  other complaints and no other preceding symptoms. After not getting much  sleep due to her frequent retching throughout the night, the patient was  noted this morning to drop to the floor and suffer with the acute onset  of violent tonic-clonic shaking type behavior. The patient's eyes were  rolled back into her head during this episode. This lasted for an  unknown amount of time but possibly a number of minutes. The patient's  husband noted that the patient turned blue during this shaking. She then  began to snore in a very prominent way. She became quite agitated as her  shaking stopped and she began to somewhat regain consciousness. She did  not however fully  regain consciousness. EMS was consulted. The patient  was transferred to the emergency room. Since her arrival in the  emergency room, the patient has only slightly improved from the  standpoint of her mental status and has required sedation due to severe  agitation at intermittent periods. There was no evidence of loss of  bowel or bladder continence. In retrospect, the family states that the  patient did possibly have an episode similar to this three weeks ago at  which time she was brought to the emergency room initially.   REVIEW OF SYSTEMS:  Review of systems cannot be accomplished as the  patient is altered in her mental status. A significant history in this  regard is provided above by the family.   PAST MEDICAL HISTORY:  1. Atrial fibrillation diagnosed December 2008 followed by Dr. Yates Decamp in the outpatient setting.  2. Status post hysterectomy.  3. Status post appendectomy.  4. Status post tonsillectomy and adenoidectomy.  5. Hypertension.  6. Gastroesophageal reflux disease.  7. Nephrolithiasis.  8. Status  post lumbar spine surgery March 2006 per Dr. Wynetta Emery.  9. Status post bilateral rotator cuff repair November 2006 on right      and May 2007 on left.   OUTPATIENT MEDICATIONS:  1. Nexium 40 mg daily.  2. Toprol XL 100 mg daily.  3. Diltiazem CD 180 mg daily.  4. Remeron 30 mg q.h.s.  5. Benazepril 20 mg daily.  6. Coumadin 5 mg daily with the exception of 2.5mg  on Sunday, Tuesday      and Thursday.   ALLERGIES:  CODEINE, OXYCONTIN, SULFA, ASPIRIN.   FAMILY HISTORY:  Noncontributory secondary to age.   SOCIAL HISTORY:  The patient lives in [place]. She does not smoke,  she does not drink, she has two healthy children.   LABORATORY DATA:  White count is elevated at 11.4 with a normal  hemoglobin and platelet count. Sodium is low at 130, potassium is low at  3.2. Chloride and bicarb are normal. BUN and creatinine are normal.  Serum glucose is elevated  at 139. LFTs are normal. INR is 2.7.  Urinalysis reveals 100 protein but is otherwise unremarkable. CT scan of  the head reveals no acute disease with diffuse cortical volume loss and  chronic microvascular white matter disease. Chest x-ray reveals no acute  disease.   PHYSICAL EXAMINATION:  Temperature 97.1, blood pressure 130/78, heart  rate 68, respiratory rate 20, O2 sat 98% on room air.  GENERAL:  Obtunded female in no acute respiratory distress.  HEENT:  Pupils equal round and reactive to light and accommodation.  Extraocular muscles are not able to be tested due to lack of cooperation  from patient. Oral cavity without evidence of bite wounds or bleeding.  NECK:  No JVD.  LUNGS:  Clear to auscultation bilaterally without wheezes or rhonchi.  CARDIOVASCULAR:  Irregularly irregular but with heart rate controlled at  approximately 70 beats per minute - no appreciable gallop or rub.  ABDOMEN:  Nontender, nondistended, soft, bowel sounds present but  hypoactive. No rebound, no ascites.  EXTREMITIES:  No significant clubbing, cyanosis or edema bilateral lower  extremities.  NEUROLOGIC:  There are no cranial nerve deficits appreciable without the  patient cooperating with exam. In other words, faces are symmetric. The  patient will not follow commands. There is no specific posturing. There  is no reaction to Babinski testing. Deep tendon reflexes are equivocal.  There is no observed seizure-like activity at the present time. The  patient's certainly appears to be in a postictal type state.   IMPRESSION/PLAN:  1. Altered mental status - my #1 element on the differential diagnosis      is that of a postictal state due to seizure activity. The acute      onset of a seizure disorder in a 77 year old is quite unusual      unless it is due to a mass occupying lesion with the CNS space.      Despite a normal CT scan, I will not feel confident that we have      ruled this out completely  until an MRI can be accomplished. I will      order an MRI for further evaluation and hope to obtain this within      the next 24 hours. Furthermore we will ask for an EEG to be      accomplished. In that there is some doubt as to whether this was a      true seizure, I will not place the  patient empirically on anti-      epileptics at the present time. There is no evidence of a urinary      tract infection nor is there evidence of a pulmonary infection. The      exact etiology of the patient's nausea and vomiting is not clear.      This could simply represent a viral gastroenteritis. We will need      to continue to follow the patient closely as she improves.      Metabolic etiologies do not appear to be at play here. I will check      a TSH for completeness sake. I will assure that the patient has had      an ABG.  2. Atrial fibrillation - at the present time the patient does appear      to be in atrial fibrillation by auscultation. Her rate is well      controlled however and she is anticoagulated. I will continue      Coumadin for now as CT scan does not reveal evidence of an      intracranial hemorrhage. I will convert the patient's rate      controlling drugs to IV form and administer them on a scheduled      basis. I have informed Dr. Jacinto Halim of the patient's admission and      requested that he simply followup with her in the morning to      monitor her A fib.  3. Hypertension. - the patient's blood pressure is currently well      controlled. We will follow her trend during her hospital stay.  4. Hypokalemia with hyponatremia - these are both likely secondary to      the patient's gastrointestinal issues and intractable vomiting. I      will address these with intravenous supplementation as well as      isotonic intravenous fluid and follow both her sodium and potassium      levels.      Lonia Blood, M.D.  Electronically Signed     JTM/MEDQ  D:  12/09/2007  T:   12/09/2007  Job:  [id number]   cc:   Cristy Hilts. Jacinto Halim, MD  [facility]

## 2011-04-16 NOTE — Discharge Summary (Signed)
[name], [name]                 ACCOUNT NO.:  [id number]   MEDICAL RECORD NO.:  [id number]          PATIENT TYPE:  INP   LOCATION:  6711                         FACILITY:  [facility]   PHYSICIAN:  ________               DATE OF BIRTH:  1933-10-22   DATE OF ADMISSION:  09/07/2008  DATE OF DISCHARGE:  09/14/2008                               DISCHARGE SUMMARY   DISCHARGE DIAGNOSES:  1. Pneumonia.  2. Sick sinus syndrome, status post pacemaker placement in August      2009.  3. Atrial fibrillation, on chronic Coumadin therapy.  4. Hypertension.  5. Dementia.  6. Bilateral cataract surgery in March 2009.  7. Anemia of chronic disease.  8. Gout.   DISCHARGE MEDICATIONS:  1. Benazepril 40 mg p.o. daily.  2. Iron sulfate 325 mg p.o. b.i.d.  3. Lopressor 25 mg p.o. b.i.d.  4. Namenda 10 mg p.o. b.i.d.  5. Coumadin 2.5 mg p.o. daily to be further adjusted by the [facility].  6. Amiodarone 200 mg p.o. daily.  7. Amlodipine 5 mg p.o. daily.  8. Allopurinol 100 mg p.o. b.i.d.  9. Avelox 400 mg p.o. daily for 4 more days.   DISPOSITION AND FOLLOWUP:  The patient is discharged home in stable  condition.  She is to follow up with the [facility] on  Friday, which is 2 days from discharge to follow up on her INR.  She is  also to follow up with her primary care physician, Dr. Tanya Nones.  I have  spoken with the husband, who has agreed to arrange for these  appointments.  I recommend rechecking a chest x-ray in 4-6 weeks to  ensure resolution of the pneumonia.   CONSULTATIONS THIS HOSPITALIZATION:  None.   IMAGES PERFORMED THIS HOSPITALIZATION:  1. A chest x-ray on September 07, 2008, consistent with bilateral      pneumonia.  A repeat x-ray on September 09, 2008, showed bibasilar      atelectasis and small left pleural effusion.  2. A repeat chest x-ray on September 10, 2008, that showed bibasilar      atelectasis with small left pleural effusion.   HISTORY AND  PHYSICAL EXAMINATION:  For full details, please see history  and physical dictated on September 07, 2008, by Dr. Vania Rea, but  in brief Heather [name] is a 77 year old white woman who was in her  baseline state of health until 5 days ago when she developed fever and  persistent cough, initially treated with cough medicine prescribed by  her primary care physician.  She then had a chest x-ray performed, and  she was treated with IM Rocephin in her PCP's office and a Z-Pak.  The  patient has got worse and apparently one out of two blood cultures grew  Gram-positive cocci clusters, and the patient was advised to come in  from hospitalization.   HOSPITAL COURSE:  1. Pneumonia.  She was initially placed on Rocephin and azithromycin      to cover for  community-acquired pneumonia.  She has been afebrile      and has not had a leukocytosis throughout this hospitalization.      She has not been hypoxic, and has been maintaining her O2 sats off      oxygen.  She has received Rocephin and azithromycin for a total of      6 days.  I will discharge her home on Avelox to complete a 10-day      course of antibiotics.  2. Anemia of chronic disease with a hemoglobin that has been stable      throughout hospitalization in the 9 range.  3. Hypertension.  Her blood pressure has been stable.  We have kept      her on home medications.  4. For atrial fibrillation, we continued her on Coumadin.  Her INR up      on discharge is slightly supratherapeutic at 3.7.  She has been      advised to not take her Coumadin today and to follow up on Friday      with the [facility].  5. For her dementia, we will continue her Namenda.   VITAL SIGNS ON THE DAY OF DISCHARGE:  Blood pressure 132/76, heart rate  62, respirations 20, O2 sats 95% on room air with a temperature of 98.1.   LABORATORIES ON THE DAY OF DISCHARGE:  Sodium 31, potassium 4.4,  chloride 99, bicarb 26, BUN 15, creatinine 1.40, and  glucose of 100.  WBC is 5.8, hemoglobin 9.1, and platelets of 271.  INR of 3.7.      Peggye Pitt, M.D.  Electronically Signed     ______________________________  ________    EH/MEDQ  D:  09/14/2008  T:  09/14/2008  Job:  [id number]   cc:   Broadus John T. Pamalee Leyden, MD

## 2011-04-16 NOTE — Discharge Summary (Signed)
Heather [name], Heather [name]                 ACCOUNT NO.:  [id number]   MEDICAL RECORD NO.:  [id number]          PATIENT TYPE:  OIB   LOCATION:  2626                         FACILITY:  [facility]   PHYSICIAN:  Ritta Slot, MD     DATE OF BIRTH:  [birth year]   DATE OF ADMISSION:  07/08/2008  DATE OF DISCHARGE:  07/09/2008                               DISCHARGE SUMMARY   DISCHARGE DIAGNOSES:  1. Sick sinus syndrome.  2. Status post permanent transvenous pacemaker implantation by Dr.      Lynnea Ferrier.  3. Atrial fibrillation with rapid ventricular response.  4. Coumadin anticoagulation therapy.   This is a 77 year old lady patient of Dr. Jacinto Halim who developed atrial  fibrillations with rapid ventricular response.  To control her rate, the  patient was placed on beta-blocker therapy but developed sinus-brady  syndrome and was very symptomatic with that.  At some point, she  developed severe bradycardia and had 2 or 3 episodes of syncope with all  negative neurological workup.   For further management of her sick sinus syndrome, the patient underwent  permanent transvenous pacemaker placement by Dr. Lynnea Ferrier on July 08, 2008.  She received [employer] device serial [id number].  The atrial  lead had serial #[id number] and ventricular lead serial #[id number].  The patient tolerated the procedure well and the next morning was seen  by Dr. Lynnea Ferrier and pacer company representative.  Her pacer was  programmed in DDDR mode.  EKG showed paced rhythm on demand, and there  were no rhythm abnormalities recorded, and all parameters were within  normal limits.   After Dr. Lynnea Ferrier examined the patient, he felt that she was stable for  discharge home.   DISCHARGE MEDICATIONS:  1. Fish oil 1000 mg b.i.d.  2. Tramadol 50 mg p.r.n.  3. Namenda 10 mg b.i.d.  4. Benazepril 20 mg b.i.d.  5. Amlodipine 5 mg daily.  6. Amiodarone 200 mg daily.  7. Mirtazapine 30 mg at bedtime.  8. Ferrous sulfate 325 mg daily.  9.  Nexium 40 mg daily.  10.Lopressor 25 mg b.i.d.  11.Coumadin 2.5 mg daily starting on Monday evening.   The patient was instructed to keep incision dry and avoid overhead  reaching with left arm as well as vigorous movements.  She was not  allowed to drive until seen in our office by Dr. Lynnea Ferrier.   FOLLOWUP:  The patient needs to be seen in [facility] on Wednesday,  July 13, 2008, for INR and ProTime checkup and adjusting the dose of  Coumadin as needed and also she needs an appointment with Dr. Lynnea Ferrier in  a week for incision check.  Our office will contact the patient to  schedule all these appointments.      Raymon Mutton, P.A.      Ritta Slot, MD  Electronically Signed    MK/MEDQ  D:  07/09/2008  T:  07/10/2008  Job:  [id number]   cc:   Dr. Tanya Nones

## 2011-04-19 NOTE — Op Note (Signed)
Heather [name], Heather [name]                           ACCOUNT NO.:  [id number]   MEDICAL RECORD NO.:  [id number]                   PATIENT TYPE:  OIB   LOCATION:  3112                                 FACILITY:  [facility]   PHYSICIAN:  Donalee Citrin, M.D.                     DATE OF BIRTH:  07/07/1933   DATE OF PROCEDURE:  10/21/2003  DATE OF DISCHARGE:                                 OPERATIVE REPORT   PREOPERATIVE DIAGNOSES:  1. Severe degenerative scoliosis with a levorotary scoliosis deformity,     predominantly with collapse at L2-3 and L4-5.  2. Severe spinal stenosis L2-3, 3-4  and 4-5 with ruptured disk at L4-5 on     the left side.   POSTOPERATIVE DIAGNOSES:  1. Severe degenerative scoliosis with a levorotary scoliosis deformity,     predominantly with collapse at L2-3 and L4-5.  2. Severe spinal stenosis L2-3, 3-4  and 4-5 with ruptured disk at L4-5 on     the left side.   PROCEDURE:  1. Redo decompressive laminectomy L2-3.  2. Decompressive laminectomy L3-4 and L4-5.  3. Posterior lumbar interbody fusion L2-3, 3-4 and 4-5 using Tangent     allograft wedges with 8 x 24 mm Tangent allograft at L2-3, 10 x 24 used     at L3-4 and L4-5.  4. Pedicle screw fixation at L2-3, 3-4 and 4-5.  5. Posterolateral arthrodesis using locally harvested autograft from L2 to     L5 with BMP substitute as well as Mastergraft bone extender.  6. Open reduction of spinal deformity at L2-3, 3-4 and 4-5.  7. Placement of medium Hemovac drain.   SURGEON:  Donalee Citrin, M.D.   ASSISTANT:  Tia Alert, MD   ANESTHESIA:  General endotracheal anesthesia.   INDICATIONS FOR PROCEDURE:  The patient is a very pleasant 77 year old  female  who has had longstanding back and leg pain that  has been refractory  to all forms of conservative treatment. The patient had progressively  worsening back, predominantly greater than leg pain with radiation down the  right leg in what predominantly appeared to be an L3 but  also an L4 and L5  nerve root distribution on the right side. Preoperative imaging showed  severe degenerative collapse at L2-3 with collapse over the foramen on the  right, the site of her previous surgery, as well as spinal stenosis due to  facet arthropathy and disk bulge degeneration at L3-4 and also severe spinal  stenosis due to facet arthropathy and degenerative scoliotic collapse of the  left side at L5 with a ruptured disk  on the left side as well.   As she had failed all conservative treatment, the patient was extensively  recommended a radical decompression and stabilization procedure. We  extensively went over the risks of a 3 level decompressive laminectomy and  interbody fusion with the patient. She  understands and agrees to proceed  forward.   DESCRIPTION OF PROCEDURE:  The patient was brought to the operating room and  after an adequate level of anesthesia was established, the patient was  placed on the Wilson frame and the back was prepped and draped in the usual  sterile fashion. Her old incision was identified and opened up as well as  extended inferiorly down to what was believed to be the L5-S1 disk space.  Then Bovie electrocautery was used, dissecting the soft tissues and a  periosteal dissection was carried out in the lamina of L2, 3, 4 and 5,  exposing the transverse processes at L2, 3, 4 and 5 bilaterally, working  through the scar tissue at L2-3 on the right, and this was all freed up,  exposing all TPs at this level.   After all 4 TPs had been exposed, intraoperative x-ray confirmed  localization of the L4 pedicle. Prior to any bone work, the spine was noted  to be severely rotated, convex to the right as well as collapsed around the  L2-3 foramen on the right and L4-5 on the left. In addition it was also  noted to be hypermobile with a hypermobile segment at 2-3 as well as 4-5.   The Leksell rongeur was then used to remove the spinous process of L4, L3   and L2 as well as radical decompressive laminectomies all 4 of those levels.  Using a 3 and 4-mm Kerrison punch, a laminotomy was also completed. First  our attention was taken on the patient's left side, where there was not any  scar tissue  and the  thecal sac was identified as well as the L2 nerve root  and L3 nerve root which was noted to be displaced dorsally due to the  rotatory scoliosis.   This was all decompressed to the foramen. The entire medial facet complex at  L2-3 was removed. This was continued down to L3-4 and with the entire medial  facet complex at L3-4 moved as well as L4-5. There was noted to be marked  stenosis on the L4 and L5 nerve roots on the left side and these were all  teased away with a 4 Penfield and removed with a 3 and 4-mm Kerrison punch.   The disk spaces were identified. All disk spaces were noted to be bulging.  The epidural bands were coagulated and attention was taken to the right  side. Using a 3, 4 and 5-mm Kerrison punch and alternating, the L5 nerve  root was identified as well as L4, and there was noted to be a lot of  stenosis around the L4 nerve root. This was decompressed at its foramen. The  entire medial facet complex at L4-5 was removed followed  by L3-4.   At the site of the previous surgery at L2-3 on the right, there was marked  scar tissue. This was freed up with the 4 Penfield and gentle dissectors to  identify the  thecal sac and the L2 nerve root. The L2 nerve root was noted  to be markedly compressed by the facet, as it had collapsed down on the disk  space and was noted to be in close proximity to the L3 nerve root.   After both nerve roots were radically decompressed and the medial facet  complex was removed after the scar tissue had been free out, the disk space  was identified and the epidural veins were coagulated. This procedure was  repeated at L3-4  and L4-5, completing the decompressive laminectomy with radical  decompression of the L2, 3, 4 and 5 nerve roots bilaterally.   Then attention was taken with pedicle screw placement. As the pedicles were  directly visualized at L2, a pilot hole was created and cannulated with the  awl probe, tapped with a 5-5 tap and a 65 x 45 pedicle screw was inserted at  L2 on the left. Then this procedure was repeated at L3, 4 and 5. The L4  pedicle was markedly collapsed at this level and severe degeneration of the  joint  complex was noted to be incompetent down to the entry into the  vertebral body, so this was kind of cleaned off and it was cannulated under  direct visualization. All 4 pedicles were probed from within the pedicle was  noted to be competent in 360 degrees of rotation and all direct  intracanicular inspection confirmed no medial bridge. A scout x-ray  confirmed good position  of the probes placed within all 4 pedicles. Then  the remainder  of the pedicles were tapped and 65 x 40 screws were inserted  at all levels. All the screws had excellent purchase. Then attention was  taken to the right side.   From the right side it was decided to go ahead and do the PLIF at  each of  the 3 levels to help correct some of her spinal deformity and make pedicle  screw placement easier. So first at L4-5  the disk spaces were cleaned on  the right side and eventually an 8, 9 and a 10 distractor was inserted on  the right and then on the left side where it had the marked collapse at L4-  5. This also was radically cleaned out with a size 10 cutter and chisel, and  a 10 x 24 mm Tangent graft was inserted on the left side. This significantly  reduced the scoliotic collapse around the L4 and L5 neuroforamen on the left  side at L4-5.   Then after the left-sided allograft was inserted at L4-5, attention was  taken to the right side. This side was again in a similar fashion cleaned  out using the size 10 cutter and chisel. The central endplates were scraped  with  the downgoing Epstein curet. Locally harvested autograft was packed  into the allograft on the left side and the right side allograft was  inserted. Fluoroscopy confirmed good placement directed at each step along  the way and both bone grafts were approximately 1 to 2 mm deep to the  posterior vertebral body line and noted the scoliotic collapse was markedly  reduced.   Then attention was taken to the L2-3 space. The disk space was radically  cleaned out on the patient's right side. This was noted to be the side of  the collapse and this was with a size 8 distractor. This was opened up and  markedly reduced the kyphotic and scoliotic collapse at L2-3. The left-sided  disk spaces were again subsequently radically cleaned out and scraped with a  size 8 cutter  and chisel and an 8 x 24 mm Tangent allograft was inserted on  the left side.   Then back to the right side the endplates were scraped and prepared to  receive the bone graft with a cutter and  chisel. Locally harvested allograft was packed against the allograft on the left side and the right  side Tangent allograft was inserted, again markedly reducing the scoliotic  collapse at that level.   Then attention was taken to the L3-4. This procedure was repeated at the L3-  4  which did not have a scoliotic collapse, however, it was markedly  degenerated and collapsed. All disk spaces were cleaned  out and the disk  bulge was noted to be taken off. The  thecal sac was noted to be markedly  decompressed. Several fragments of disk were removed. The disk was cleaned  out  sequentially with a 10 distractor. The left side was cut and chiseled  and a 10 x 24 mm Tangent allograft was inserted on the left side. Then the  procedure was repeated on the right with a locally harvested autograft patch  against the allograft on the left side and the right-sided allograft was  inserted.   After all 6 allografts were inserted, attention was taken to  the pedicle  screw placement  on the right side. Again the pedicles were immediately  identified  and cannulated with the awl and probed from within. The pedicle  was tapped with a 5-5 tap and 65 x 40 pedicle screws were inserted on the  right side at each level of L2, L3, L4 and L5.   After all 8 pedicle screws were placed and fluoroscopy confirmed them in  good position of each screw along the way, the wound was copiously  irrigated. Then aggressive decortication was carried out along the  transverse processes at each side. The locally harvested autograft was mixed  with Mastergraft which was then soaked in marrow blood as well as blood from  the surgery, and then laid on a BNP  sponge. The sponge was rolled into a  cigar roll and placed in the lateral  gutters along the TPs from L2 to L5.   The remainder of the locally harvested autograft was packed on top of the  sponge soaked rolls  in the lateral gutters and then the rods  were  identified  and the appropriate size was selected and was inserted. The  ___________ nuts were tightened down at L5. The L4 pedicle screw was  compressed against the L5 on the right side to distract the left-sided  collapse in the upper reaches of the deformity and then the left-sided L4  screw was just tightened without being compressed. Then at L3-4  the pedicle  screws were compressed against the L4 pedicles and then at L2-3 the left-  sided pedicle was compressed against the left side at L3 with the right side  tightened in situ.   After all the pedicle screws had been tightened down and the rods had been  attached, a 322 cross-link was inserted and postoperative fluoroscopy  confirmed good position  of the screws, rods and bone grafts. Then after the  wound was copiously irrigated and meticulous hemostasis was being  maintained, this irrigation occurred before placing the BNP. Gelfoam was  overlaid over top of the dura. A medium Hemovac drain was  placed and muscle and fascia were reapproximated with 0 interrupted Vicryl and the  subcutaneous tissue was closed with 2-0 interrupted Vicryl. The skin was  closed with running 4-0 silk sutures. Bacitracin and Steri-Strips were  applied.   The patient was taken to the recovery room in stable condition. All sponge,  instrument and needle counts at the end of the case.  Donalee Citrin, M.D.    GC/MEDQ  D:  10/21/2003  T:  10/22/2003  Job:  [id number]

## 2011-04-19 NOTE — Op Note (Signed)
Heather [name], TIJERINO                 ACCOUNT NO.:  [id number]   MEDICAL RECORD NO.:  [id number]          PATIENT TYPE:  AMB   LOCATION:  SDS                          FACILITY:  [facility]   PHYSICIAN:  Burnard Bunting, M.D.    DATE OF BIRTH:  February 02, 1933   DATE OF PROCEDURE:  09/24/2005  DATE OF DISCHARGE:                                 OPERATIVE REPORT   PREOPERATIVE DIAGNOSIS:  Right shoulder bursitis and biceps tendinitis and  synovitis.   POSTOPERATIVE DIAGNOSIS:  Right shoulder bursitis and biceps tendinitis and  synovitis.   PROCEDURE:  Right shoulder diagnostic arthroscopy with subacromial  decompression, extensive debridement of partial thickness rotator cuff tear,  labral tear, and synovitis, with arthroscopic biceps tenotomy.   SURGEON:  Burnard Bunting, M.D.   ASSISTANT:  None.   ANESTHESIA:  General endotracheal anesthesia.   SPECIMENS:  None.   ESTIMATED BLOOD LOSS:  50 mL.   OPERATIVE FINDINGS:  1.  Examination under anesthesia, forward flexion 180, external rotation 15      degrees, abduction 75, forward flexion 180, shoulder stability 1+      anterior and posterior +1.  2.  Diagnostic arthroscopy:  Type 4 SLAP tear with significant degenerative      changes of the biceps tendon, intact glenohumeral articular cartilage,      synovitis within the rotator interval, partial thickness rotator cuff      tear, articular and bursal side, bursitis in the subacromial space.   PROCEDURE IN DETAIL:  The patient was brought to the operating room where  general endotracheal anesthesia was induced.  The patient was placed in the  lateral position, right arm suspended in 10 pounds of traction and 15  degrees forward flexion and 60 degrees of abduction.  The left axilla and  peroneal nerve were well padded.  The shoulder was prepped with DuraPrep  solution and draped in a sterile manner.  Topographic anatomy on the  shoulder was identified including the posterolateral and anterior  margin of  the acromion as well as the coracoid process.  30 mL of saline with  epinephrine was injected into the subacromial space.  At this time, a  posterior portal was made 2 cm lateral and inferomedial to the  posterolateral margin of the acromion.  The scope was placed into the  glenohumeral joint.  Diagnostic arthroscopy was performed.  Synovitis was  present in the rotator interval, this was then extensively debrided, type 4  SLAP tear with debridement with tearing and fraying of the biceps tendon was  present, as well.  A biceps tenotomy was performed, the superior labrum was  extensively debrided.  Partial thickness rotator cuff tearing was also  debrided.  No full thickness tear from the articular side was noted and,  particularly, at the leading edges of the superior spinatus.  The intra-  articular subscap was intact, the glenohumeral articular surfaces were  intact.  Debridement was facilitated by placement of a portal through the  anterior aspect of the rotator interval.  The scope was then placed into the  subacromial space.  Bursectomy was performed after creation of a lateral  portal under direct visualization.  The CA ligament was preserved.  A small  bone spur was resected.  The  shoulder joint was then thoroughly irrigated and instrument removed from the  portals.  The portals were closed using 3-0 nylon suture.  A bulky dressing  was applied.  The patient tolerated the procedure well without any immediate  complications.           ______________________________  G. Dorene Grebe, M.D.     GSD/MEDQ  D:  09/24/2005  T:  09/24/2005  Job:  [id number]

## 2011-04-19 NOTE — Consult Note (Signed)
Heather [name], Heather [name]                           ACCOUNT NO.:  [id number]   MEDICAL RECORD NO.:  [id number]                   PATIENT TYPE:  INP   LOCATION:  3739                                 FACILITY:  [facility]   PHYSICIAN:  Althea Grimmer. Luther Parody, M.D.            DATE OF BIRTH:  02-13-1933   DATE OF CONSULTATION:  01/01/2003  DATE OF DISCHARGE:                                   CONSULTATION   HISTORY OF PRESENT ILLNESS:  The patient is a 77 year old female admitted to  the hospital for anorexia, nausea, vomiting, early satiety, and dysgeusia.  She reportedly has lost 8-9 pounds in the past few months but is definitely  not wasted in appearance.  She has a long history of gastrointestinal  symptoms, and review of her office outpatient chart from 1998 indicates that  basically all of her complaints at this time are similar to complaints then.  She was felt to have some degree of irritable bowel syndrome and functional  upper GI complaints though an upper endoscopy in July 1998 did demonstrate  Helicobacter pylori gastritis which was treated with Prilosec, Biaxin and  amoxicillin.  She also had a small hiatal hernia and suffered a Mallory-  Weiss tear at the time of the endoscopy.  Biopsies of her small bowel were  unremarkable.  An attempt at colonoscopy was successful only to 40 cm due to  tortuosity and diverticulosis.  Followup barium enema in 1998 demonstrated  no other pathology and a repeat barium enema in August 2003 similarly showed  diverticulosis without any pathology.  The patient denies abdominal pain at  this time.  An ultrasonogram and CT scan essentially are negative except for  tiny gallbladder polyps.   PAST MEDICAL HISTORY:  Pertinent for reflux with a small hiatal hernia,  fatigue, insomnia and difficult to control hypertension.   PAST SURGICAL HISTORY:  Total hysterectomy, appendectomy.   < OUTPATIENT MEDICATIONS/>  Outpatient medications included Nexium and  Vioxx.   INPATIENT MEDICATIONS:  1. In the hospital, she has been treated with Protonix intravenously.  2. Altace 10 mg every day  3. Aspirin 81 mg every day.  4. Hydrochlorothiazide 25 mg every day.  5. Metoprolol 100 mg every day.  6. Catapres p.r.n.   ALLERGIES:  SULFA and CODEINE.   FAMILY HISTORY:  Notable for a sister with pancreatitis.   SOCIAL HISTORY:  She is a retired nonsmoker, nondrinker.  Five attentive  family members are in the patient's room at this time.   REVIEW OF SYSTEMS:  General:  Mild weight loss. No night sweats.  Endocrine:  No history of diabetes or thyroid problems.  Skin:  No rash or pruritus.  Eyes:  No icterus or change in vision.  ENT:  No aphthous ulcers or chronic  sore throat.  Respiratory:  No shortness of breath, cough or wheezing.  Cardiac:  No chest pain, palpitations or history of  valvular heart disease.  Gastrointestinal:  No dysuria or hematuria.  The remainder of the review of  systems is negative.   PHYSICAL EXAMINATION:  GENERAL:  She is a well-developed, well-nourished  adult female in no acute distress.  VITAL SIGNS:  Afebrile.  Blood pressure 180/80, pulse is 56 and regular.  SKIN:  Normal.  HEENT:  Eyes anicteric.  Oropharynx unremarkable.  NECK:  Supple without thyromegaly.  There is no cervical or inguinal  adenopathy.  CHEST:  Clear.  HEART:  Regular rate and rhythm without murmurs, rubs or gallops.  ABDOMEN:  Soft without mass, tenderness or organomegaly.  There is no  rebound or hernia.  Bowel sounds are normal.  RECTAL:  Exam not performed.  EXTREMITIES:  Without cyanosis, clubbing or edema or rash.   LABORATORY DATA:  Amylase 64.  Electrolytes are normal.  Albumin 3.4.  SGOT  41.  Hemoglobin is 10.3 which is noted to be stable since 1998.  MCV 81.8.  Sedimentation rate 9.  TSH, B12 and folate levels are normal.  CEA is less  than 0.5.   IMPRESSION:  [age]-year-old female with probable functional GI  symptoms.   They have been longstanding and recurrent and sound very similar  to her problems from 1998.  Other diagnostic considerations would be  gastroparesis, worsening reflux with a large hiatal hernia or gastric  neoplasm.  It must be admitted that Helicobacter pylori is a risk factor for  gastric cancer and she has not been assessed for eradication of this  bacteria.   RECOMMENDATIONS:  Upper endoscopy will be performed.  If the patient  continues having problems or if anorexia or vomiting consideration could be  given to a gastric emptying study if nothing is found on endoscopy or if  there is a suggestion of residual fluid or food in the stomach.  If  gastrointestinal evaluation is unrevealing, consideration should be given to  a CT scan of the brain as a central nervous system lesion could be  responsible for both nausea and dysgeusia.  I suspect all of the above will  be unrevealing, however, and that the patient's underlying anxiety or  depression needs to be more adequately addressed.                                               Althea Grimmer. Luther Parody, M.D.    PJS/MEDQ  D:  01/01/2003  T:  01/01/2003  Job:  [id number]   cc:   Sherin Quarry, MD  [address]  [place]  [place] [address]  Fax: 1   Robert L. Foy [place], M.D.  [address] [place]  [place] [address]  Fax: [phone]   Everardo All. Madilyn Fireman, M.D.  [address]  [place]  [place] [address]  Fax: [phone]

## 2011-04-19 NOTE — Discharge Summary (Signed)
Heather [name], Heather [name]:  [id number]   MEDICAL RECORD NO.:  [id number]          PATIENT TYPE:  INP   LOCATION:  3014                         FACILITY:  [facility]   PHYSICIAN:  Donalee Citrin, M.D.        DATE OF BIRTH:  [birth year]   DATE OF ADMISSION:  02/06/2005  DATE OF DISCHARGE:  02/12/2005                           DISCHARGE SUMMARY - REFERRING   ADMISSION DIAGNOSES:  1.  Spinal instability.  2.  Degenerative disc disease at L1-2 above level of an old fusion from L2      to L5.   PROCEDURE:  L1-2 posterior lumbar interbody fusion, removal of hardware at  L2-3.   HOSPITAL COURSE:  Patient is a very pleasant 77 year old female who  underwent L2 to 5 fusion several years prior to admission.  Presented with  progressive worsening back and leg pain.  Preoperative imaging showed severe  degenerative collapse and degenerative scoliosis with degenerative disc  disease at L1-2 above the level of fusion.  Patient was recommended  extension of fusion __________ after failure of all forms of conservative  treatment.  This patient was brought to the operating room and underwent  removal of hardware at L2-3 and underwent fusion at L1-2 with a break in the  hardware from L2 to L3, however, patient was solidly fused, noted both  intraoperatively and preoperative x-rays.  Postoperatively patient went to  the recovery room and then the floor.  On the floor, was doing very well.  Back pain was well controlled.  Was progressively mobilized.  Had no  postoperative leg pain.  Was slightly confused and this was felt secondary  to medications so her PCA was discontinued.  She was held on her medications  overnight and reinitiated back on Darvocet and mild pain killers and this  progressively allowed her confusion to clear up.  At the time of discharge,  which was on hospital day #7, she was awake, alert and oriented, ambulating  and voiding, afebrile and tolerated a regular diet.   Patient was stable  enough to discharge home with follow-up in two weeks.      GC/MEDQ  D:  04/03/2005  T:  04/03/2005  Job:  [id number]

## 2011-04-19 NOTE — Op Note (Signed)
   NAMESELITA, [name]                           ACCOUNT NO.:  [id number]   MEDICAL RECORD NO.:  [id number]                   PATIENT TYPE:  AMB   LOCATION:  DSC                                  FACILITY:  [facility]   PHYSICIAN:  Matthew A. Mina Marble, M.D.           DATE OF BIRTH:  [birth year]   DATE OF PROCEDURE:  08/10/2003  DATE OF DISCHARGE:                                 OPERATIVE REPORT   PREOPERATIVE DIAGNOSIS:  Right thumb stenosing tenosynovitis.   POSTOPERATIVE DIAGNOSIS:  Right thumb stenosing tenosynovitis.   OPERATION PERFORMED:  Right thumb A-1 pulley release.   SURGEON:  Artist Pais. Mina Marble, M.D.   ASSISTANT:  Nurse.   ANESTHESIA:  Monitored anesthesia care with 1% plain lidocaine and 0.25%  plain Marcaine in the usual field block performed by the surgeon, 5mL.   TOURNIQUET TIME:  12 minutes.   COMPLICATIONS:  None.   DRAINS:  None.   DESCRIPTION OF PROCEDURE:  The patient was taken to the operating room.  After the induction of adequate intravenous sedation, 5mL of a mixture of 1%  plain lidocaine and 0.25% plain Marcaine was injected into the MP flexion  crease of the right thumb.  At this point the arm was elevated and  exsanguinated with an Esmarch.  The tourniquet was then inflated to .  At this point a 2 cm incision was made in the MP flexion crease.  Incision  was taken down through the skin and subcutaneous tissues.  Once this was  done, care was taken to retract the digital nerves.  The A-1 pulley was  identified and split.  The FDL tendon was lysed of all adhesions.  The wound  was irrigated and closed with 5-0 nylon.  Sterile dressings, Xeroform, 4 x  4s, Coban wrap, Steri-Strips etc were placed.  The patient tolerated the  procedure well.  The patient was then transferred to the recovery room in  stable fashion.                                                Artist Pais Mina Marble, M.D.    MAW/MEDQ  D:  08/10/2003  T:  08/10/2003  Job:   [id number]

## 2011-04-19 NOTE — H&P (Signed)
NAMEMILEE, QUALLS NO.:  [id number]   MEDICAL RECORD NO.:  [id number]          PATIENT TYPE:  INP   LOCATION:  2899                         FACILITY:  [facility]   PHYSICIAN:  Donalee Citrin, M.D.        DATE OF BIRTH:  [birth year]   DATE OF ADMISSION:  02/06/2005  DATE OF DISCHARGE:                                HISTORY & PHYSICAL   PREOPERATIVE DIAGNOSIS:  L1-2 segmental instability, history of previous L2-  5 fusion.  Procedure during this hospitalization will be an L1-2 fusion with  exploration of the L2-5 fusion.   HISTORY OF PRESENT ILLNESS:  The patient is a very pleasant 77 year old  female who has longstanding back and bilateral leg pain approximately 2  years ago underwent an L2-5 fusion for severe lumbar spinal stenosis and  degenerative scoliosis.  The patient has had progressive worsening back pain  above the level of her previous fusion with radiation into both hips.  Previous x-ray showed segmental instability of the L1-2 segment with  degenerative collapse above the level of the previous fusion.  With the  patient's failure of all forms of conservative treatment with physical  therapy, steroid treatment, injection management, patient has been  recommended extension of fusion of L1-2.  The risks and benefits were  counseled to the patient to include exploration of previous fusion to make  the decision intraoperatively based on the strength and integrity of the  fusion to incorporate connectors to connect the L1-2 rod into the L2-3 rod  as opposed to just leaving it as a gap in a previously fused segment.  Patient understands the risks involved in this procedure and wants to  proceed forward.   PAST MEDICAL HISTORY:  Previous laminectomies in 1996 with a redo fusion in  2004, she has had a hysterectomy, she has had spur removals from the feet,  appendectomy, tonsillectomy, thumb surgery.  She has a history of  hypertension.  She has a history of reflux,  kidney stones, gout.   MEDICATION ALLERGIES:  She has no medication allergies.   MEDICATIONS:  Medications include Toprol, Nexium, Mirtazapine, clonazepam,  Altace, fexofenadine, tramadol and aspirin.   PRIMARY CARE PHYSICIAN:  Dr. Marinda Elk   PHYSICAL EXAMINATION:  GENERAL:  She is a very pleasant, awake and alert, 77-  year-old female in no apparent distress.  HEENT:  Within normal limits.  NECK:  Full range of motion with no bruits.  LUNGS:  Clear.  HEART:  Regular.  ABDOMEN:  Benign.  NEUROLOGIC:  She has got 5/5 strength in upper extremities and lower  extremities, iliopsoas, quad exam,  gastrocnemius and EHL.  Normal symmetric reflexes and sensation with  significantly decreased range of motion of the back.   ASSESSMENT AND PLAN:  Patient presents for lumbar spinal fusion.      GC/MEDQ  D:  02/06/2005  T:  02/06/2005  Job:  [id number]

## 2011-04-19 NOTE — Op Note (Signed)
Heather [name], MCDONNELL NO.:  [id number]   MEDICAL RECORD NO.:  [id number]          PATIENT TYPE:  INP   LOCATION:  2899                         FACILITY:  [facility]   PHYSICIAN:  Donalee Citrin, M.D.        DATE OF BIRTH:  1933-10-02   DATE OF PROCEDURE:  02/06/2005  DATE OF DISCHARGE:                                 OPERATIVE REPORT   PREOPERATIVE DIAGNOSES:  1.  Segmental instability, L1-2.  2.  Lumbar spinal stenosis, L1-2.  3.  Degenerative scoliosis, L1-2.  4.  History of previous L2 to L5 fusion.   PROCEDURES:  1.  Exploration at L2-3 fusion with removal of hardware, L2-3.  2.  Decompressive lumbar laminectomy, L1-2.  3.  Posterior lumbar interbody fusion, L1-2 using 10 x 26 mm Tangent      allograft wedges.  4.  Pedicle screw fixation, L1-2.  5.  Posterolateral arthrodesis, L1-2, using locally-harvested autograft and      the MasterGraft bone substitute.  6.  Open reduction of spinal deformity, L1-2.   SURGEON:  Donalee Citrin, M.D.   ASSISTANT:  Tia Alert, M.D.   ANESTHESIA:  General endotracheal.   HISTORY OF PRESENT ILLNESS:  The patient is a very pleasant 77 year old  female who has had longstanding back and left greater than right leg pain  with radiation across her hip and into the top of her leg.  Patient's  preoperative imaging showed segmental degeneration at L1-2 above and  previous L2 to L5 fusion that she had undergone two years prior.  This  showed degenerative collapse with scoliotic deformity on the right at the  disk space as well as spondylolisthesis that moved on dynamic x-rays.  With  failure of conservative treatment, patient was recommended re-exploration of  L2-3 fusion and posterior lumbar interbody fusion, L1-2.  The risks and  benefits were explained.  I extensively went over it, and she understands  and agreed to proceed forward.   The patient was brought into the OR, was induced under general anesthesia,  positioned prone on  the Wilson frame.  The back was prepped and draped in  the usual sterile fashion.  Her old incision, the superior aspect of the old  incision, was opened up, extended slightly cephalad.  Bovie electrocautery  was used to take dissection down through the subcutaneous tissue and  subperiosteal dissection carried out on the lamina of T12-L1 and the  residual lamina of L2.  Scar tissue was dissected free at the L2-3 disk  space.  The rods were identified and the screws were identified.  There was  noted to be a profound amount of solid bone in the posterolateral space at  L2-3, and this was felt to be a solid fusion mass.  Leksell was used to  grasp the screw head and the whole L2 to L5 segment moved as a unit, felt to  be solidly fused, so the nuts were removed from the top of the screw heads  at L2 and the rod was cut with a rod-cutting drill bit at L2-3.  It was felt  this was not necessary to be reconnected due to the solid fusion she had at  L2-3.  After this was performed, the spinous process was removed at L1 and a  radical decompressive laminectomy was completed at L1-2 and a complete  medial facetectomy, and extensive scar tissue was removed at the inferior  aspect of the laminotomy defect, identifying the L2 nerve.  The L1 nerve was  identified out its foramen and radically decompressed.  Then the joint at L1-  2 predominantly on the right side was noted to be markedly degenerated with  degenerative synovium, and this was all removed in piecemeal fashion.  The  disk space on the right was noted to be completely collapsed and degenerated  with an annular rent appreciated with herniation on that side.  The disk  space on the left was, the annulus was still competent; however, there was  severe degenerative collapse on the right at this side as well as splayed  open on the left.  So after a complete decompression had been performed at  L1-2, interbody work, a D'Errico was used to reflect  the L2 nerve root  medially, taking care to minimize retraction to the level of the conus at  this level.  The rent was extended, and ruptured disk was radically cleaned  out and the disk space was cleaned out.  Using a small cutter and chisel,  the end plate was scraped, the posterior osteophytes were removed.  Then  using the size 9 distractor, this was placed.  This was loose and  fluoroscopy confirmed that this was too small a distractor for this  interspace, so it was decided at this time to use 10 x 26 mm grafts.  A 10  chisel was then used to prepare the end plates.  The central, lateral, and  cephalad and caudad end plates were scraped with a downgoing Epstein curette  and all the remainder of the disk space, and this was removed.  A 10 x 26 mm  Tangent graft wedge graft was inserted on the right side.  Then on the left  side, the D'Errico distracted the left L2 nerve root medially and the disk  space was radically cleaned out.  The disk again was noted to be markedly  degenerated.  Several fragments were removed from the central compartment as  well as the lateral compartment and a 10 x 26 mm Tangent allograft wedge was  inserted in the left side after locally-harvested autograft was packed  against the right-side allograft.  After fluoroscopy confirmed good position  of all the bone graft at L1-2, attention was taken to pedicle screw  placement.  A high-speed drill was used to drill a pilot hole at the L1  pedicle, cannulated with the awl, tapped, probed from within the pedicle as  well as within the canal, and a 6 x 45 screw inserted on the left at L1.  Then on the right side attention was taken to getting a pedicle screw placed  here.  The first two passes broke out laterally and inferiorly, so a  trajectory was taken from the second pilot hole much more medially due to  the scoliotic deformity, and this was cannulated with the awl, tapped with a 4.5 tap, a smaller tap this  time, and a 6 x 45 screw inserted in this  location, exaggerating the medial trajectory with direct visualization of  the medial aspect of the pedicle during the whole pass  of the screw.  This  was felt to have excellent purchase and confirmed no medial or lateral  breach.  Then after the pedicle screw was inserted, the wound was copiously  irrigated and meticulous hemostasis was maintained.  Aggressive  decortication was carried out, the TPs of L1 and L2, and the posterolateral  fusion was drilled down partially to accept new bone graft.  The remainder  of the locally-harvested autograft was packed in the lateral gutters,  augmented with MasterGraft bone substitute.  Then 40 mm rods were inserted  on the right with a 50 on the left, the top-tightening nuts tightened down  at L2.  The L1 pedicle screw was compressed against L2, reducing the  scoliotic deformity as had been partially done with the interbody work.  Then a crosslink was inserted.  Postoperative fluoroscopy confirmed complete  reduction of scoliosis as well as good positioning of the bone graft and  screws and rods with also reduction of spondylolisthesis.  Then Gelfoam was  overlaid on top of the dura.  The muscle and fascia were reapproximated with  0 interrupted Vicryl and the subcuticular tissues with 2-0 interrupted  Vicryl, and the skin was closed with a running 4-0 subcuticular, and Benzoin  and Steri-Strips were applied.  The patient went to the recovery room in  stable condition.  At the end of the case, all needle counts and sponge  counts were correct.      GC/MEDQ  D:  02/06/2005  T:  02/06/2005  Job:  [id number]

## 2011-04-19 NOTE — Discharge Summary (Signed)
NAMEDEBE, ANFINSON                           ACCOUNT NO.:  [id number]   MEDICAL RECORD NO.:  [id number]                   PATIENT TYPE:  INP   LOCATION:  3739                                 FACILITY:  [facility]   PHYSICIAN:  Sherin Quarry, MD                   DATE OF BIRTH:  [birth year]   DATE OF ADMISSION:  12/31/2002  DATE OF DISCHARGE:                                 DISCHARGE SUMMARY   HISTORY:  Heather [name] is a 77 year old lady who reports that since December  11, she has been experiencing chronic nausea and anorexia.  She finds food  repellant and does not want to eat.  She has had intermittent throbbing  headache.  Sometimes, she feels like she is going to vomit, but she does not  actually vomit.  Prior to admission, Dr. Foy Guadalajara had done an abdominal  ultrasound which showed tiny gallbladder polyps.  The pancreas was not well  seen in this study.  The patient has had previous GI studies done by Dr.  Madilyn Fireman.  It is still a little bit unclear to me exactly when these were done,  possibly about five or six years ago.  She had endoscopy and colonoscopy.   PHYSICAL EXAMINATION:  HEENT:  Within normal limits.  CHEST:  Clear.  The back revealed CVA or point tenderness.  CARDIOVASCULAR:  Revealed normal S1, S2.  No rubs, murmurs, or gallops.  ABDOMEN:  Benign, normal bowel sounds without masses, tenderness, or  organomegaly.  NEUROLOGIC:  Examination of extremities were normal.   LABORATORY DATA:  Laboratory studies obtained included a B12 level which was  376.  CEA was less than 0.5.  Folate was greater than 20.  T4 and TSH were  normal.  Cardiac enzymes were negative.  The hemoglobin was 10.3 with  normochromic, normocytic indices.  Sedimentation rate was 9.  Liver profile  was within normal limits.  Potassium was 4.1.  Creatinine was 1.1.  BUN was  26.  A CT scan of the abdomen was obtained which showed no significant  abnormalities.  An electrocardiogram showed sinus  bradycardia.   Consultation was obtained with Dr. Luther Parody with [facility].  It was his impression that the patient's GI symptoms were likely  to be functional as they were of long-standing and recurrent nature.  He  recommended upper endoscopy.  This was done on February 2.  Dr. Luther Parody  saw a very minimal amount of erythema of the body of the stomach, but other  than also finding a hiatus hernia, nothing else was seen.  It should also be  pointed out that Dr. Luther Parody had suggested doing a CT scan of the brain.  This had actually been done on admission and was negative.   DISCHARGE DIAGNOSES:  On 2/02, the patient was discharged.  1. Nausea and anorexia, probable function gastrointestinal disorder,  possible depression.  2. Hypertension, difficult to control.  3. Mild gastritis.  4. Status post hysterectomy and bilateral salpingo-oophorectomy.  5. Chronic fatigue and insomnia.   DISCHARGE MEDICATIONS:  1. Nexium 40 mg daily.  2. Altace 10 mg daily.  3. Aspirin 81 mg daily.  4. Hydrochlorothiazide 25 mg daily.  5. Toprol 100 mg daily.   These medications are the same as on admission.  In addition, on admission  to the hospital I placed her Zoloft 25 mg daily.  She tolerated this  medicine well and I had the perception that perhaps she was sleeping better.  I went ahead and increased the Zoloft to 50 mg daily at the time of  discharge.  I contacted Dr. Foy Guadalajara and advised him about the patient's  hospital course.  Dr. Foy Guadalajara will check on her again in about 10-14 days.                                               Sherin Quarry, MD    SY/MEDQ  D:  01/03/2003  T:  01/03/2003  Job:  [id number]   cc:   Molly Maduro L. Foy Guadalajara, M.D.  [address] [place]  [place] [address]  Fax: [phone]   Althea Grimmer. Luther Parody, M.D.  [address], [address]  [place]  [place] [address]  Fax: [phone]

## 2011-04-19 NOTE — Discharge Summary (Signed)
Heather [name], Heather [name]                           ACCOUNT NO.:  [id number]   MEDICAL RECORD NO.:  [id number]                   PATIENT TYPE:  INP   LOCATION:  3040                                 FACILITY:  [facility]   PHYSICIAN:  Donalee Citrin, M.D.                     DATE OF BIRTH:  Jul 16, 1933   DATE OF ADMISSION:  10/21/2003  DATE OF DISCHARGE:  10/26/2003                                 DISCHARGE SUMMARY   ADMISSION DIAGNOSIS:  Severe degenerative scoliosis with lumbar spinal  stenosis at L2-3, L3-4, and L4-5.   PROCEDURES:  Posterior lumbar interbody fusion of L2-3, L3-4, and L4-5.   HISTORY OF PRESENT ILLNESS:  The patient is a very pleasant 76 year old  female who has had longstanding chronic back pain.  She underwent previous  laminectomy several years ago.  However, it has gotten progressively worse.  She has back much greater than leg pain with difficulty walking.  She has  severe spinal stenosis at three levels and the patient to undergo three-  level fusion.   HOSPITAL COURSE:  The patient was admitted and taken to the operating room  where she underwent the above procedure.  Postoperatively, the patient was  doing very well on the floor and was afebrile.  The blood pressure was a  little bit on the low end in the high 90s and low 100s.  The patient was  mobilized and given additional fluids to stimulate blood pressure.  Hemovac  was maintained.  Her postoperative hematocrit was 28.9.  She was awake and  alert postoperatively.  She was kept in the stepdown unit of the intensive  care unit, however, she was mobilized and transferred out to the floor two  days postoperatively.  She was running a low-grade fever on day #2 to 100.5  degrees.  She was given incentive spirometer __________.  She was maintained  on prophylactic antibiotics __________.  The drain was to be taken out on  day #4.  The patient continued to do well with physical and occupational  therapy.  She did have some  difficulty with postoperative urinary retention,  however, after a couple of I&O catheterizations, the patient was able to  void spontaneously.  The postoperative UA at that time was negative for any  sign of infection.  At the time of discharge, the patient was ambulating and  voiding spontaneously.  The hematocrit was 27.  The incision was stable.  The wound was clean and dry.  She was discharged home with outpatient  therapy and scheduled followup in two weeks                                                Donalee Citrin, M.D.    GC/MEDQ  D:  12/16/2003  T:  12/16/2003  Job:  [id number]

## 2011-04-19 NOTE — Op Note (Signed)
NAMEMYKAEL, Heather [name]                 ACCOUNT NO.:  [id number]   MEDICAL RECORD NO.:  [id number]          PATIENT TYPE:  AMB   LOCATION:  SDS                          FACILITY:  [facility]   PHYSICIAN:  Burnard Bunting, M.D.    DATE OF BIRTH:  1933/11/09   DATE OF PROCEDURE:  10/18/2005  DATE OF DISCHARGE:  09/24/2005                                 OPERATIVE REPORT   PREOPERATIVE DIAGNOSIS:  Right shoulder rotator cuff tear, impingement,  bursitis, synovitis and biceps tendinitis.   POSTOPERATIVE DIAGNOSIS:  Right shoulder rotator cuff tear, impingement,  bursitis, synovitis and biceps tendinitis.   PROCEDURE:  1.  Right shoulder diagnostic and operative arthroscopy with extensive      debridement.  2.  Biceps tenotomy.  3.  Subacromial decompression.   SURGEON ATTENDING:  Dorene Grebe, M.D.   ASSISTANT:  None.   OPERATIVE FINDINGS:  1.  Examination under anesthesia revealed range of motion with external      rotation 15 degrees, adduction 70, external rotation 90 degrees,      abduction 90, forward flexion 180.  2.  Diagnostic and operative arthroscopy.      1.  Extensive synovitis and rotator cuff tearing and biceps tendinitis.      2.  Irreparable rotator cuff tear.      3.  Impingement bursitis.   PROCEDURE IN DETAIL:  Patient brought to the operating room where general  endotracheal anesthesia was induced.  The patient was placed in the beach  chair position with the neck in neutral position.  Right arm was then  prepped including the hand.  The right arm and shoulder was prepped,  including the hand, with DuraPrep solution and draped in a sterile manner.  Topographic anatomy of the shoulder was identified including the posterior,  anterior and lateral margin of the acromion as well as the coracoid process.  The posterior portal was first established 2 cm medial and inferior to the  posterolateral margin of the acromion.  The shoulder joint was entered.  Diagnostic arthroscopy  was performed.  Extensive synovitis and a  nonrepairable rotator cuff tear was encountered, this was extensively  debrided.  Rotator interval synovitis was also debrided.  __________  articular surface was intact biceps.  Patient had a type 2 SLAP tear with  greater than 50% tearing of the biceps tendon.  This tendon was tenotomized  and the superior labrum was then extensively debrided.  This was all  facilitated with placement of an anterior portal  under direct  visualization.  A lateral portal was then placed and subacromial  decompression was performed with bursectomy and debridement of the acromion  without release of the CA ligament.  Following acromioplasty, the  patient's arm was taken through a range of motion; no bursitis or  impingement was noted.  The patient's shoulder joint was thoroughly  irrigated, instruments were removed, portals were closed using a 3-0 nylon  suture.  A bulky dressing was applied.           ______________________________  G. Dorene Grebe, M.D.  GSD/MEDQ  D:  10/18/2005  T:  10/20/2005  Job:  [id number]

## 2011-04-19 NOTE — Op Note (Signed)
Heather [name], Heather [name]                 ACCOUNT NO.:  [id number]   MEDICAL RECORD NO.:  [id number]          PATIENT TYPE:  AMB   LOCATION:  SDS                          FACILITY:  [facility]   PHYSICIAN:  Burnard Bunting, M.D.    DATE OF BIRTH:  September 01, 1933   DATE OF PROCEDURE:  04/15/2006  DATE OF DISCHARGE:                                 OPERATIVE REPORT   PREOPERATIVE DIAGNOSES:  Left shoulder synovitis, biceps tendon tearing and  impingement and bursitis.   POSTOPERATIVE DIAGNOSES:  Left shoulder synovitis, biceps tendon tearing and  impingement and bursitis.   OPERATION/PROCEDURE:  1.  Left shoulder diagnostic arthroscopy with extensive debridement.  2.  Biceps tenotomy.  3.  Subacromial decompression.   SURGEON:  Burnard Bunting, M.D.   ASSISTANT:  None.   ANESTHESIA:  General endotracheal anesthesia.   ESTIMATED BLOOD LOSS:  3 mL.   DRAINS:  None.   OPERATIVE FINDINGS:  1.  Examination under anesthesia:  Range of motion, full forward flexion.      External rotation 90-degree. Abduction is at 90.  Internal rotation 90-      degree.  Adduction 70.  Abduction is 120.  2.  Diagnostic arthroscopy:  Fraying and degenerative tearing of the rotator      cuff about 20% thickness on the articular side. Greater than 50% tear of      the biceps tendon with type 2 flap tear.  No glenohumeral arthritic      changes.  Extensive subacromial bursitis and subacromial and intra-      articular synovitis.   INDICATIONS:  Heather [name] is a 77 year old female with left shoulder  synovitis, tension bursitis and biceps tearing.  This has failed  conservative measures and management and she presents now for operative  management.   DESCRIPTION OF PROCEDURE:  The patient was brought to the operating room  where general endotracheal anesthesia was induced where general endotracheal  anesthesia was induced and.  Preoperative antibiotics were administered.  The patient was placed in the supine position  with the head in neutral  position and the right arm well padded.  The left shoulder, hand and arm  were prepped with DuraPrep solution and draped in a sterile manner.  Topographic anatomy of the shoulder was identified including the  posterolateral and anterior margin of the acromion as well as the coracoid  process.  The solution of saline and epinephrine was injected through a  subacromial space.  Solution of saline was injected into the shoulder joint.  Posterior portals were created 2 cm medial and inferior to the  posterolateral margin of the acromion.  Scope was placed into the  glenohumeral joint. Diagnostic arthroscopy was performed.  Type 2 flap tear  and tearing and greater than 50% tearing of the biceps tendon of a  degenerative nature were encountered.  Extensive synovitis was noted.  Partial thickness tearing of the rotator cuff supraspinatus attachment on  the articular surface by about 20% was also encountered.  Glenohumeral  articular surface in general was intact.  At this time extensive debridement  of the synovitis within the rotator interval and around the biceps tendon  was performed.  Biceps tenotomy was performed.  Extensive debridement of the  labrum was then performed as well as the frayed edges of the rotator cuff.  There was no full-thickness rotator cuff tear.  Following biceps tenotomy  and extensive debridement, the ArthroCare wand was used to control bleeding  points.  This was all facilitated through placement of an anterior portal.  Following debridement, and coagulation, the scope was placed through the  subacromial space.  The lateral portal was established.  ArthroCare wand was  used to perform bursectomy.  Extensive bursectomy was performed.  CA  ligament was released but not resected.  Subacromial decompression and  acromioplasty was performed.  Cutting block technique was utilized.  Clavicle was co-planed.  At this time the incision was thoroughly  irrigated  and instruments were removed from the portals.  The wound was closed using 3-  0 nylon.  Solution of Marcaine, morphine and clonidine was injected in a  divided fashion into the subacromial space and the glenohumeral joint.  The  patient was placed in a bulky dressing and shoulder sling.  She tolerated  the procedure well without immediate complications.           ______________________________  G. Dorene Grebe, M.D.     GSD/MEDQ  D:  04/15/2006  T:  04/15/2006  Job:  [id number]

## 2011-04-23 ENCOUNTER — Encounter (HOSPITAL_BASED_OUTPATIENT_CLINIC_OR_DEPARTMENT_OTHER)
Admission: RE | Admit: 2011-04-23 | Discharge: 2011-04-23 | Disposition: A | Discharge status: Home / Self Care | Payer: Medicare Other | Source: Ambulatory Visit | Attending: Orthopedic Surgery | Admitting: Orthopedic Surgery

## 2011-04-23 LAB — BASIC METABOLIC PANEL WITH GFR
BUN: 16 mg/dL (ref 6–23)
CO2: 27 meq/L (ref 19–32)
Calcium: 9.7 mg/dL (ref 8.4–10.5)
Chloride: 105 meq/L (ref 96–112)
Creatinine, Ser: 1.12 mg/dL (ref 0.4–1.2)
GFR calc non Af Amer: 47 mL/min — ABNORMAL LOW
Glucose, Bld: 94 mg/dL (ref 70–99)
Potassium: 4 meq/L (ref 3.5–5.1)
Sodium: 142 meq/L (ref 135–145)

## 2011-04-23 LAB — PROTIME-INR
INR: 1.11 (ref 0.00–1.49)
Prothrombin Time: 14.5 s (ref 11.6–15.2)

## 2011-04-23 LAB — APTT: aPTT: 29 s (ref 24–37)

## 2011-04-24 ENCOUNTER — Ambulatory Visit (HOSPITAL_BASED_OUTPATIENT_CLINIC_OR_DEPARTMENT_OTHER)
Admission: RE | Admit: 2011-04-24 | Discharge: 2011-04-24 | Disposition: A | Discharge status: Home / Self Care | Payer: Medicare Other | Source: Ambulatory Visit | Attending: Orthopedic Surgery | Admitting: Orthopedic Surgery

## 2011-04-24 DIAGNOSIS — Z01812 Encounter for preprocedural laboratory examination: Secondary | ICD-10-CM | POA: Insufficient documentation

## 2011-04-24 DIAGNOSIS — Z950 Presence of cardiac pacemaker: Secondary | ICD-10-CM | POA: Insufficient documentation

## 2011-04-24 DIAGNOSIS — M6530 Trigger finger, unspecified finger: Secondary | ICD-10-CM | POA: Insufficient documentation

## 2011-04-24 DIAGNOSIS — K219 Gastro-esophageal reflux disease without esophagitis: Secondary | ICD-10-CM | POA: Insufficient documentation

## 2011-04-24 DIAGNOSIS — I10 Essential (primary) hypertension: Secondary | ICD-10-CM | POA: Insufficient documentation

## 2011-04-24 DIAGNOSIS — M65839 Other synovitis and tenosynovitis, unspecified forearm: Secondary | ICD-10-CM | POA: Insufficient documentation

## 2011-04-24 DIAGNOSIS — I4891 Unspecified atrial fibrillation: Secondary | ICD-10-CM | POA: Insufficient documentation

## 2011-04-24 HISTORY — PX: TRIGGER FINGER RELEASE: SHX641

## 2011-06-21 NOTE — Op Note (Signed)
  [name], [name] NO.:  [id number]  MEDICAL RECORD NO.:  [id number]           PATIENT TYPE:  LOCATION:                                 FACILITY:  PHYSICIAN:  Cindee Salt, M.D.       DATE OF BIRTH:  [birth year]  DATE OF PROCEDURE: DATE OF DISCHARGE:                              OPERATIVE REPORT   ADDRESS:  Claude Manges, MD [address] [place], [place]  [address]  BODY:  Dear Dr. Tanya Nones:  It is noted Heather [name]'s hemoglobin had dropped from her last hospital visit from 11-8 on her preoperative laboratory work here at [facility].  I am writing just to let you know that this has occurred.  If I can be of any further assistance, you are free to contact me.  Sincerely,          ______________________________ Cindee Salt, M.D.     GK/MEDQ  D:  04/24/2011  T:  04/25/2011  Job:  [id number]  Electronically Signed by Cindee Salt M.D. on 06/21/2011 09:14:37 AM

## 2011-06-21 NOTE — Op Note (Signed)
NAMEHERMIE, Heather [name]                 ACCOUNT NO.:  [id number]  MEDICAL RECORD NO.:  [id number]           PATIENT TYPE:  LOCATION:                                 FACILITY:  PHYSICIAN:  Cindee Salt, M.D.       DATE OF BIRTH:  1933/07/20  DATE OF PROCEDURE:  04/24/2011 DATE OF DISCHARGE:                              OPERATIVE REPORT   PREOPERATIVE DIAGNOSIS:  Stenosing tenosynovitis, left index finger.  POSTOPERATIVE DIAGNOSIS:  Stenosing tenosynovitis, left index finger.  OPERATION:  Release of A1 pulley, left index finger.  SURGEON:  Cindee Salt, MD  ANESTHESIA:  Forearm-based IV regional.  ANESTHESIOLOGIST:  Dr. Jean Rosenthal.  HISTORY:  The patient is a 77 year old female with a history of stenosing tenosynovitis left index finger not responsive to conservative treatment.  She has elected to undergo surgical release.  Pre, peri, postoperative course are discussed along with risks and complications. She is aware there is no guarantee with the surgery, possibility of infection, recurrence of injury to arteries, nerves, tendons, incomplete relief of symptoms, dystrophy.  In the preoperative area, the patient is seen.  The extremity marked by both the patient and surgeon, noted that her hemoglobin has dropped from 11-8 since her last hospital admission. Her husband and she were advised to let Dr. Tanya Nones know with respect to this, questions are encouraged and answered.  PROCEDURE:  The patient was brought to the operating room where a forearm-based IV regional anesthetic was carried out without difficulty. She was prepped using ChloraPrep, supine position with the left arm free.  A 3-minute dry time was allowed, time-out taken, confirming the patient and procedure.  An oblique incision was made over the A1 pulley of the left index finger.  She had some feeling where the incision was made.  The area was block with 0.25% Marcaine without epinephrine, approximately 2 mL was used.   Dissection carried down.  Retractors placed protecting the radial and ulnar digital neurovascular bundles. The A1 pulley was identified.  This was found to be thickened.  A small flexor sheath cyst was also present.  The cyst was cleansed.  An incision was then made on the radial aspect of the A1 pulley, and a small incision made centrally in the A2 pulley.  This allowed full flexion and extension of her finger without further triggering.  Partial synovectomy was performed proximally of the tenosynovial tissue surrounding both tendons.  The wound was copiously irrigated with saline.  The skin then closed with interrupted 5-0 Vicryl Rapide sutures.  A sterile compressive dressing was applied with the fingers free.  On deflation of the tourniquet, all fingers immediately pinked. She was taken to the recovery room for observation in satisfactory condition.  Bleeders were electrocauterized with bipolar throughout the procedure.  The patient tolerated the procedure well. She will be discharged home to return to the [facility] in 1 week on [place] [facility].          ______________________________ Cindee Salt, M.D.     GK/MEDQ  D:  04/24/2011  T:  04/25/2011  Job:  [id number]  cc:  Claude Manges, MD  Electronically Signed by Cindee Salt M.D. on 06/21/2011 09:14:31 AM

## 2011-07-05 ENCOUNTER — Encounter: Payer: Self-pay | Admitting: Family Medicine

## 2011-07-05 DIAGNOSIS — K5790 Diverticulosis of intestine, part unspecified, without perforation or abscess without bleeding: Secondary | ICD-10-CM | POA: Insufficient documentation

## 2011-07-05 DIAGNOSIS — I10 Essential (primary) hypertension: Secondary | ICD-10-CM | POA: Insufficient documentation

## 2011-07-05 DIAGNOSIS — E785 Hyperlipidemia, unspecified: Secondary | ICD-10-CM | POA: Insufficient documentation

## 2011-07-05 DIAGNOSIS — R011 Cardiac murmur, unspecified: Secondary | ICD-10-CM | POA: Insufficient documentation

## 2011-08-21 LAB — CBC
MCHC: 32.9
MCHC: 33.7
MCV: 82.6
Platelets: 121 — ABNORMAL LOW
Platelets: 88 — ABNORMAL LOW
RBC: 4.36
RBC: 5.22 — ABNORMAL HIGH
RBC: 5.5 — ABNORMAL HIGH
RDW: 15.6 — ABNORMAL HIGH
RDW: 16.8 — ABNORMAL HIGH
WBC: 13.5 — ABNORMAL HIGH
WBC: 9.9

## 2011-08-21 LAB — BASIC METABOLIC PANEL
BUN: 12
CO2: 23
Calcium: 8.4
Calcium: 8.5
Creatinine, Ser: 1.02
GFR calc Af Amer: >60
GFR calc non Af Amer: 41 — ABNORMAL LOW
GFR calc non Af Amer: 53 — ABNORMAL LOW
Glucose, Bld: 117 — ABNORMAL HIGH
Sodium: 134 — ABNORMAL LOW
Sodium: 135

## 2011-08-21 LAB — I-STAT 8, (EC8 V) (CONVERTED LAB)
Bicarbonate: 23.9
Glucose, Bld: 139 — ABNORMAL HIGH
HCT: 51 — ABNORMAL HIGH
Hemoglobin: 17.3 — ABNORMAL HIGH
Operator id: 198171
Sodium: 130 — ABNORMAL LOW
TCO2: 25
pCO2, Ven: 29.1 — ABNORMAL LOW

## 2011-08-21 LAB — URINE MICROSCOPIC-ADD ON

## 2011-08-21 LAB — DIFFERENTIAL
Basophils Absolute: 0
Basophils Relative: 0
Eosinophils Absolute: 0
Eosinophils Relative: 0
Monocytes Absolute: 1

## 2011-08-21 LAB — COMPREHENSIVE METABOLIC PANEL
ALT: 15
AST: 30
Albumin: 3.5
Alkaline Phosphatase: 62
CO2: 28
Chloride: 96
GFR calc Af Amer: 53 — ABNORMAL LOW
GFR calc non Af Amer: 44 — ABNORMAL LOW
Potassium: 3.7
Sodium: 133 — ABNORMAL LOW
Total Bilirubin: 0.7

## 2011-08-21 LAB — LIPID PANEL
Cholesterol: 227 — ABNORMAL HIGH
HDL: 75
Total CHOL/HDL Ratio: 3
VLDL: 12

## 2011-08-21 LAB — PROTIME-INR
INR: 4.3 — ABNORMAL HIGH
INR: 4.7 — ABNORMAL HIGH
Prothrombin Time: 28 — ABNORMAL HIGH
Prothrombin Time: 29.8 — ABNORMAL HIGH
Prothrombin Time: 46.1 — ABNORMAL HIGH

## 2011-08-21 LAB — APTT: aPTT: 37

## 2011-08-21 LAB — PHOSPHORUS: Phosphorus: 3.4

## 2011-08-21 LAB — HEMOGLOBIN A1C: Hgb A1c MFr Bld: 5.3

## 2011-08-21 LAB — HEPATIC FUNCTION PANEL
Bilirubin, Direct: 0.2
Indirect Bilirubin: 0.8
Total Protein: 7

## 2011-08-21 LAB — CULTURE, BLOOD (ROUTINE X 2)

## 2011-08-21 LAB — POCT I-STAT CREATININE: Creatinine, Ser: 1.3 — ABNORMAL HIGH

## 2011-08-21 LAB — OSMOLALITY, URINE: Osmolality, Ur: 140 — ABNORMAL LOW

## 2011-08-21 LAB — URINALYSIS, ROUTINE W REFLEX MICROSCOPIC
Nitrite: NEGATIVE
Specific Gravity, Urine: 1.016
Urobilinogen, UA: 0.2

## 2011-08-21 LAB — OSMOLALITY: Osmolality: 267 — ABNORMAL LOW

## 2011-08-21 LAB — MAGNESIUM: Magnesium: 1.7

## 2011-08-28 LAB — CK TOTAL AND CKMB (NOT AT ARMC)
CK, MB: 2.7
Total CK: 162

## 2011-08-28 LAB — URINALYSIS, ROUTINE W REFLEX MICROSCOPIC
Ketones, ur: NEGATIVE
Nitrite: NEGATIVE
Protein, ur: NEGATIVE
pH: 6

## 2011-08-28 LAB — MAGNESIUM: Magnesium: 1.8

## 2011-08-28 LAB — BASIC METABOLIC PANEL
CO2: 23
Chloride: 112
Glucose, Bld: 89
Potassium: 4
Sodium: 142

## 2011-08-28 LAB — CARDIAC PANEL(CRET KIN+CKTOT+MB+TROPI)
CK, MB: 1.6
Relative Index: 1.5
Total CK: 109
Troponin I: 0.01

## 2011-08-28 LAB — CBC
HCT: 30 — ABNORMAL LOW
Hemoglobin: 10.1 — ABNORMAL LOW
Hemoglobin: 9.3 — ABNORMAL LOW
MCHC: 33.8
MCV: 87.1
RBC: 3.18 — ABNORMAL LOW
RDW: 14.4

## 2011-08-28 LAB — TSH: TSH: 1.071

## 2011-08-28 LAB — IRON AND TIBC
Saturation Ratios: 44
TIBC: 280

## 2011-08-28 LAB — COMPREHENSIVE METABOLIC PANEL
ALT: 16
Calcium: 9.1
GFR calc Af Amer: 34 — ABNORMAL LOW
Glucose, Bld: 86
Sodium: 140
Total Protein: 6

## 2011-08-28 LAB — LIPID PANEL
HDL: 36 — ABNORMAL LOW
LDL Cholesterol: 138 — ABNORMAL HIGH
Total CHOL/HDL Ratio: 5.3
VLDL: 17

## 2011-08-28 LAB — FOLATE: Folate: >20

## 2011-08-28 LAB — POCT I-STAT, CHEM 8
BUN: 34 — ABNORMAL HIGH
Chloride: 110
Potassium: 4.4
Sodium: 141
TCO2: 18

## 2011-08-28 LAB — FERRITIN: Ferritin: 34 (ref 10–291)

## 2011-08-28 LAB — POCT CARDIAC MARKERS: Troponin i, poc: <0.05

## 2011-08-30 LAB — PROTIME-INR: INR: 1.5

## 2011-09-02 LAB — IRON AND TIBC: Iron: 23 — ABNORMAL LOW

## 2011-09-02 LAB — BASIC METABOLIC PANEL
BUN: 15
BUN: 4 — ABNORMAL LOW
BUN: 5 — ABNORMAL LOW
BUN: 5 — ABNORMAL LOW
CO2: 23
CO2: 24
CO2: 27
CO2: 28
CO2: 29
Calcium: 7.9 — ABNORMAL LOW
Calcium: 8.3 — ABNORMAL LOW
Calcium: 8.6
Chloride: 100
Chloride: 103
Chloride: 112
Creatinine, Ser: 1.03
Creatinine, Ser: 1.04
Creatinine, Ser: 1.27 — ABNORMAL HIGH
Creatinine, Ser: 1.47 — ABNORMAL HIGH
Creatinine, Ser: 1.49 — ABNORMAL HIGH
GFR calc Af Amer: 42 — ABNORMAL LOW
GFR calc Af Amer: 50 — ABNORMAL LOW
GFR calc Af Amer: >60
GFR calc Af Amer: >60
GFR calc Af Amer: >60
GFR calc non Af Amer: 34 — ABNORMAL LOW
GFR calc non Af Amer: 42 — ABNORMAL LOW
GFR calc non Af Amer: 50 — ABNORMAL LOW
GFR calc non Af Amer: 53 — ABNORMAL LOW
Glucose, Bld: 100 — ABNORMAL HIGH
Glucose, Bld: 102 — ABNORMAL HIGH
Glucose, Bld: 103 — ABNORMAL HIGH
Glucose, Bld: 85
Glucose, Bld: 97
Potassium: 3.1 — ABNORMAL LOW
Potassium: 4.3
Potassium: 4.7
Sodium: 139
Sodium: 140
Sodium: 142

## 2011-09-02 LAB — CULTURE, BLOOD (ROUTINE X 2): Culture: NO GROWTH

## 2011-09-02 LAB — CROSSMATCH
ABO/RH(D): O NEG
Antibody Screen: POSITIVE

## 2011-09-02 LAB — COMPREHENSIVE METABOLIC PANEL
ALT: 41 — ABNORMAL HIGH
AST: 35
Alkaline Phosphatase: 53
CO2: 25
Chloride: 105
Creatinine, Ser: 1.19
GFR calc Af Amer: 54 — ABNORMAL LOW
GFR calc non Af Amer: 44 — ABNORMAL LOW
Potassium: 3 — ABNORMAL LOW
Total Bilirubin: 0.7

## 2011-09-02 LAB — URINALYSIS, ROUTINE W REFLEX MICROSCOPIC
Ketones, ur: 15 — AB
Nitrite: NEGATIVE
Protein, ur: 30 — AB

## 2011-09-02 LAB — DIFFERENTIAL
Basophils Absolute: 0
Basophils Relative: 0
Eosinophils Absolute: 0
Eosinophils Relative: 0
Lymphocytes Relative: 27
Monocytes Absolute: 0.4

## 2011-09-02 LAB — CBC
HCT: 25.4 — ABNORMAL LOW
HCT: 30.3 — ABNORMAL LOW
HCT: 30.3 — ABNORMAL LOW
HCT: 30.7 — ABNORMAL LOW
Hemoglobin: 10 — ABNORMAL LOW
Hemoglobin: 10.2 — ABNORMAL LOW
Hemoglobin: 8.5 — ABNORMAL LOW
Hemoglobin: 9.4 — ABNORMAL LOW
MCHC: 32.5
MCHC: 33
MCHC: 33.3
MCHC: 33.3
MCHC: 33.3
MCV: 86.3
MCV: 86.7
MCV: 87.6
MCV: 88.8
MCV: 89.2
Platelets: 203
Platelets: 269
Platelets: 271
RBC: 2.83 — ABNORMAL LOW
RBC: 2.94 — ABNORMAL LOW
RBC: 3.17 — ABNORMAL LOW
RBC: 3.26 — ABNORMAL LOW
RBC: 3.41 — ABNORMAL LOW
RBC: 3.49 — ABNORMAL LOW
RDW: 15.3
RDW: 15.4
RDW: 15.6 — ABNORMAL HIGH
RDW: 15.7 — ABNORMAL HIGH
RDW: 16.3 — ABNORMAL HIGH
WBC: 5.2
WBC: 5.9
WBC: 8.4

## 2011-09-02 LAB — PROTIME-INR
INR: 2.5 — ABNORMAL HIGH
INR: 3.7 — ABNORMAL HIGH
INR: 6.3
INR: 6.7
Prothrombin Time: 26.1 — ABNORMAL HIGH
Prothrombin Time: 38.3 — ABNORMAL HIGH
Prothrombin Time: 39.7 — ABNORMAL HIGH
Prothrombin Time: 49.6 — ABNORMAL HIGH
Prothrombin Time: 62.1 — ABNORMAL HIGH

## 2011-09-02 LAB — CARDIAC PANEL(CRET KIN+CKTOT+MB+TROPI)
CK, MB: 2.5
Relative Index: INVALID
Total CK: 109
Total CK: 47
Troponin I: 0.02

## 2011-09-02 LAB — URINE MICROSCOPIC-ADD ON

## 2011-09-02 LAB — URINE CULTURE
Colony Count: NO GROWTH
Culture: NO GROWTH

## 2011-09-02 LAB — GLUCOSE, CAPILLARY
Glucose-Capillary: 101 — ABNORMAL HIGH
Glucose-Capillary: 105 — ABNORMAL HIGH
Glucose-Capillary: 117 — ABNORMAL HIGH
Glucose-Capillary: 97

## 2011-09-02 LAB — FERRITIN: Ferritin: 187 (ref 10–291)

## 2011-09-02 LAB — RETICULOCYTES: Retic Count, Absolute: 23.3

## 2011-09-06 LAB — CBC
HCT: 35.8 — ABNORMAL LOW
MCHC: 32.6
MCV: 82.5
MCV: 83
Platelets: 223
Platelets: 248
RBC: 4.46
RDW: 14.4

## 2011-09-06 LAB — DIFFERENTIAL
Eosinophils Relative: 1
Lymphocytes Relative: 27
Lymphs Abs: 2.4

## 2011-09-06 LAB — LIPID PANEL
Cholesterol: 187
HDL: 49
LDL Cholesterol: 121 — ABNORMAL HIGH
Triglycerides: 83

## 2011-09-06 LAB — PROTIME-INR
INR: 1.9 — ABNORMAL HIGH
INR: 2.2 — ABNORMAL HIGH
Prothrombin Time: 22.7 — ABNORMAL HIGH
Prothrombin Time: 25.2 — ABNORMAL HIGH

## 2011-09-06 LAB — COMPREHENSIVE METABOLIC PANEL
AST: 24
CO2: 25
Calcium: 9.4
Creatinine, Ser: 1.36 — ABNORMAL HIGH
GFR calc Af Amer: 46 — ABNORMAL LOW
GFR calc non Af Amer: 38 — ABNORMAL LOW

## 2011-09-06 LAB — CK TOTAL AND CKMB (NOT AT ARMC)
CK, MB: 1.4
Relative Index: INVALID
Total CK: 41
Total CK: 56

## 2011-09-06 LAB — TYPE AND SCREEN: ABO/RH(D): O NEG

## 2011-09-06 LAB — POCT CARDIAC MARKERS
CKMB, poc: 1.6
Myoglobin, poc: 89.2

## 2011-09-06 LAB — TSH: TSH: 0.67

## 2012-01-16 ENCOUNTER — Encounter (HOSPITAL_BASED_OUTPATIENT_CLINIC_OR_DEPARTMENT_OTHER): Payer: Self-pay | Admitting: *Deleted

## 2012-01-17 ENCOUNTER — Encounter (HOSPITAL_BASED_OUTPATIENT_CLINIC_OR_DEPARTMENT_OTHER): Payer: Self-pay | Admitting: *Deleted

## 2012-01-17 NOTE — Pre-Procedure Instructions (Addendum)
History reviewed by Dr. Gelene Mink, pt. OK to come for surg.  Pt. to come for BMET, PT, PTT, EKG, CXR

## 2012-01-20 NOTE — Pre-Procedure Instructions (Signed)
Spoke with husband, he forgot to bring pt. for labs - pt.'s sister's funeral was yesterday.  Instructed to bring pt. at 0745 01/21/2012, will do labs prior to surg.

## 2012-01-21 ENCOUNTER — Other Ambulatory Visit: Payer: Self-pay | Admitting: Orthopedic Surgery

## 2012-01-21 ENCOUNTER — Encounter (HOSPITAL_BASED_OUTPATIENT_CLINIC_OR_DEPARTMENT_OTHER): Payer: Self-pay | Admitting: Anesthesiology

## 2012-01-21 ENCOUNTER — Encounter (HOSPITAL_BASED_OUTPATIENT_CLINIC_OR_DEPARTMENT_OTHER): Payer: Self-pay | Admitting: *Deleted

## 2012-01-21 ENCOUNTER — Ambulatory Visit (HOSPITAL_BASED_OUTPATIENT_CLINIC_OR_DEPARTMENT_OTHER): Payer: Medicare Other | Admitting: Anesthesiology

## 2012-01-21 ENCOUNTER — Encounter (HOSPITAL_BASED_OUTPATIENT_CLINIC_OR_DEPARTMENT_OTHER)
Admission: RE | Disposition: A | Discharge status: Home / Self Care | Payer: Self-pay | Source: Ambulatory Visit | Attending: Orthopedic Surgery

## 2012-01-21 ENCOUNTER — Ambulatory Visit (HOSPITAL_BASED_OUTPATIENT_CLINIC_OR_DEPARTMENT_OTHER)
Admission: RE | Admit: 2012-01-21 | Discharge: 2012-01-21 | Disposition: A | Discharge status: Home / Self Care | Payer: Medicare Other | Source: Ambulatory Visit | Attending: Orthopedic Surgery | Admitting: Orthopedic Surgery

## 2012-01-21 DIAGNOSIS — E785 Hyperlipidemia, unspecified: Secondary | ICD-10-CM | POA: Insufficient documentation

## 2012-01-21 DIAGNOSIS — Z950 Presence of cardiac pacemaker: Secondary | ICD-10-CM | POA: Insufficient documentation

## 2012-01-21 DIAGNOSIS — I10 Essential (primary) hypertension: Secondary | ICD-10-CM | POA: Insufficient documentation

## 2012-01-21 DIAGNOSIS — M6530 Trigger finger, unspecified finger: Secondary | ICD-10-CM | POA: Insufficient documentation

## 2012-01-21 HISTORY — DX: Unspecified dementia, unspecified severity, without behavioral disturbance, psychotic disturbance, mood disturbance, and anxiety: F03.90

## 2012-01-21 HISTORY — DX: Scoliosis, unspecified: M41.9

## 2012-01-21 HISTORY — DX: Cardiac arrhythmia, unspecified: I49.9

## 2012-01-21 HISTORY — DX: Presence of cardiac pacemaker: Z95.0

## 2012-01-21 HISTORY — DX: Unspecified convulsions: R56.9

## 2012-01-21 HISTORY — PX: TRIGGER FINGER RELEASE: SHX641

## 2012-01-21 HISTORY — DX: Unspecified osteoarthritis, unspecified site: M19.90

## 2012-01-21 HISTORY — DX: Gastro-esophageal reflux disease without esophagitis: K21.9

## 2012-01-21 LAB — POCT I-STAT, CHEM 8
BUN: 29 mg/dL — ABNORMAL HIGH (ref 6–23)
Calcium, Ion: 1.25 mmol/L (ref 1.12–1.32)
Chloride: 109 mEq/L (ref 96–112)
Glucose, Bld: 87 mg/dL (ref 70–99)
HCT: 35 % — ABNORMAL LOW (ref 36.0–46.0)
TCO2: 26 mmol/L (ref 0–100)

## 2012-01-21 SURGERY — RELEASE, A1 PULLEY, FOR TRIGGER FINGER
Anesthesia: Monitor Anesthesia Care | Site: Finger | Laterality: Right | Wound class: Clean

## 2012-01-21 MED ORDER — FENTANYL CITRATE 0.05 MG/ML IJ SOLN
INTRAMUSCULAR | Status: DC | PRN
  Administered 2012-01-21 (×2): 50 ug via INTRAVENOUS

## 2012-01-21 MED ORDER — ONDANSETRON HCL 4 MG/2ML IJ SOLN
INTRAMUSCULAR | Status: DC | PRN
  Administered 2012-01-21: 4 mg via INTRAVENOUS

## 2012-01-21 MED ORDER — PENTAZOCINE-NALOXONE 50-0.5 MG PO TABS: 1.0000 | ORAL_TABLET | ORAL | Status: AC | PRN

## 2012-01-21 MED ORDER — PROPOFOL 10 MG/ML IV EMUL
INTRAVENOUS | Status: DC | PRN
  Administered 2012-01-21: 50 ug/kg/min via INTRAVENOUS

## 2012-01-21 MED ORDER — LACTATED RINGERS IV SOLN
INTRAVENOUS | Status: DC
  Administered 2012-01-21: 09:00:00 via INTRAVENOUS

## 2012-01-21 MED ORDER — CHLORHEXIDINE GLUCONATE 4 % EX LIQD: 60.0000 mL | Freq: Once | CUTANEOUS | Status: DC

## 2012-01-21 MED ORDER — CEFAZOLIN SODIUM 1-5 GM-% IV SOLN
1.0000 g | INTRAVENOUS | Status: AC
  Administered 2012-01-21: 1 g via INTRAVENOUS

## 2012-01-21 MED ORDER — BUPIVACAINE HCL (PF) 0.25 % IJ SOLN
INTRAMUSCULAR | Status: DC | PRN
  Administered 2012-01-21: 4 mL

## 2012-01-21 SURGICAL SUPPLY — 35 items
BANDAGE COBAN STERILE 2 (GAUZE/BANDAGES/DRESSINGS) ×2 IMPLANT
BLADE SURG 15 STRL LF DISP TIS (BLADE) ×1 IMPLANT
BLADE SURG 15 STRL SS (BLADE) ×1
BNDG ESMARK 4X9 LF (GAUZE/BANDAGES/DRESSINGS) IMPLANT
CHLORAPREP W/TINT 26ML (MISCELLANEOUS) ×2 IMPLANT
CLOTH BEACON ORANGE TIMEOUT ST (SAFETY) ×2 IMPLANT
CORDS BIPOLAR (ELECTRODE) IMPLANT
COVER MAYO STAND STRL (DRAPES) ×2 IMPLANT
COVER TABLE BACK 60X90 (DRAPES) ×2 IMPLANT
CUFF TOURNIQUET SINGLE 18IN (TOURNIQUET CUFF) ×2 IMPLANT
DECANTER SPIKE VIAL GLASS SM (MISCELLANEOUS) IMPLANT
DRAPE EXTREMITY T 121X128X90 (DRAPE) ×2 IMPLANT
DRAPE SURG 17X23 STRL (DRAPES) ×2 IMPLANT
GAUZE XEROFORM 1X8 LF (GAUZE/BANDAGES/DRESSINGS) ×2 IMPLANT
GLOVE BIO SURGEON STRL SZ 6.5 (GLOVE) IMPLANT
GLOVE BIO SURGEON STRL SZ7 (GLOVE) ×2 IMPLANT
GLOVE BIO SURGEON STRL SZ7.5 (GLOVE) ×2 IMPLANT
GLOVE BIOGEL PI IND STRL 7.0 (GLOVE) ×1 IMPLANT
GLOVE BIOGEL PI INDICATOR 7.0 (GLOVE) ×1
GLOVE SURG ORTHO 8.0 STRL STRW (GLOVE) ×2 IMPLANT
GOWN BRE IMP PREV XXLGXLNG (GOWN DISPOSABLE) ×4 IMPLANT
GOWN PREVENTION PLUS XLARGE (GOWN DISPOSABLE) ×2 IMPLANT
NEEDLE 27GAX1X1/2 (NEEDLE) ×2 IMPLANT
NS IRRIG 1000ML POUR BTL (IV SOLUTION) ×2 IMPLANT
PACK BASIN DAY SURGERY FS (CUSTOM PROCEDURE TRAY) ×2 IMPLANT
PADDING CAST ABS 4INX4YD NS (CAST SUPPLIES) ×1
PADDING CAST ABS COTTON 4X4 ST (CAST SUPPLIES) ×1 IMPLANT
SPONGE GAUZE 4X4 12PLY (GAUZE/BANDAGES/DRESSINGS) ×2 IMPLANT
STOCKINETTE 4X48 STRL (DRAPES) ×2 IMPLANT
SUT VICRYL RAPIDE 4/0 PS 2 (SUTURE) ×2 IMPLANT
SYR BULB 3OZ (MISCELLANEOUS) ×2 IMPLANT
SYR CONTROL 10ML LL (SYRINGE) ×2 IMPLANT
TOWEL OR 17X24 6PK STRL BLUE (TOWEL DISPOSABLE) ×2 IMPLANT
UNDERPAD 30X30 INCONTINENT (UNDERPADS AND DIAPERS) ×2 IMPLANT
WATER STERILE IRR 1000ML POUR (IV SOLUTION) IMPLANT

## 2012-01-21 NOTE — Op Note (Signed)
Dictated number: [id number]

## 2012-01-21 NOTE — Op Note (Signed)
NAMEWENDELIN, Heather [name]               ACCOUNT NO.:  [id number]  MEDICAL RECORD NO.:  [id number]  LOCATION:                                 FACILITY:  PHYSICIAN:  Cindee Salt, M.D.            DATE OF BIRTH:  DATE OF PROCEDURE: DATE OF DISCHARGE:                              OPERATIVE REPORT   PREOPERATIVE DIAGNOSIS:  Trigger finger, right ring finger.  POSTOPERATIVE DIAGNOSIS:  Trigger finger, right ring finger.  OPERATION:  Release of A1 pulley, right ring finger.  SURGEON:  Cindee Salt, M.D.  ASSISTANT:  Betha Loa, MD  ANESTHESIA:  Forearm based IV regional with local infiltration.  HISTORY:  The patient is a 78 year old female with a history of gout triggering her right ring finger.  This did not respond to conservative treatment.  She has elected to undergo surgical decompression of the A1 pulley.  Pre, peri, postoperative course discussed along with risks and complications.  She is aware that there is no guarantee with surgery, possibility of infection, recurrence, injury to arteries, nerves, tendons, incomplete relief of symptoms, dystrophy.  In the preoperative area, the patient was seen, the extremity marked by both the patient and surgeon.  Antibiotic given.  PROCEDURE:  The patient was brought to the operating room where forearm based IV regional anesthetic was carried out without difficulty.  She was prepped using ChloraPrep, supine position, right arm free.  A 3 minute dry time was allowed.  Time-out taken, confirming the patient, procedure.  Oblique incision was made over the A1 pulley of the right ring finger and carried down through subcutaneous tissue.  Bleeders were electrocauterized with bipolar.  The A1 pulley was identified after retractors placed, protecting both radial and ulnar neurovascular bundles.  The A1 pulley was released on its radial aspect.  Small incision made centrally in A2.  A very significant tenosynovitis was present proximally.  This was  excised.  The finger placed through a full range motion, no further triggering was noted.  The wound was irrigated. Skin was closed with interrupted 4-0 Vicryl Rapide sutures.  A local infiltration of 0.25% Marcaine without epinephrine was given, 4 mL was used.  Sterile compressive dressing applied.  On deflation of the tourniquet, all fingers immediately pinked.  She was taken to the recovery room for observation.          ______________________________ Cindee Salt, M.D.     GK/MEDQ  D:  01/21/2012  T:  01/21/2012  Job:  [id number]

## 2012-01-21 NOTE — Transfer of Care (Signed)
Immediate Anesthesia Transfer of Care Note  Patient: Heather [name]  Procedure(s) Performed: Procedure(s) (LRB): RELEASE TRIGGER FINGER/A-1 PULLEY (Right)  Patient Location: PACU  Anesthesia Type: Bier block  Level of Consciousness: awake  Airway & Oxygen Therapy: Patient Spontanous Breathing and Patient connected to face mask oxygen  Post-op Assessment: Report given to PACU RN and Post -op Vital signs reviewed and stable  Post vital signs: Reviewed and stable  Complications: No apparent anesthesia complications

## 2012-01-21 NOTE — H&P (Signed)
Heather [name]  has not been seen in 6 months. She comes in complaining of catching of her right ring finger. This has been going on for 2 months. She has recently placed a splint for comfort. She complains of pain at the MCP joint. She has had multiple digits in the past without relief. She complains of constant pain. She is not complaining of any numbness or tingling.   Past Medical History:  She is allergic to Codeine and aspirin. She is on Namenda, Metoprolol, Losartan, Tramadol, Cyclobenzaprine and Amlodipine. She has had a suspensionplasty done, multiple trigger finger releases.  Family Medical History: Positive for heart disease and high BP.  Social History: she does not smoke or drink. She is not working at the present time.  Review of Systems: Positive for glasses, otherwise negative for 14 points. Heather [name] is an 78 y.o. female.   Chief Complaint: sts rrf HPI: see above  Past Medical History  Diagnosis Date  . Hyperlipidemia   . Chronic LBP   . Gout 01/2012    right hand  . Dysrhythmia     atrial fib  . Pacemaker   . Seizures 2009    seizure x 1; none since pacemaker insertion  . Dementia   . Scoliosis   . Hypertension     under control    Past Surgical History  Procedure Date  . Tonsillectomy   . Hernia repair   . Trigger finger release 04/24/2011    release A-1 pulley left index  . Carpal tunnel release 11/14/2009    right  . Appendectomy   . Abdominal hysterectomy   . Heel spur surgery     both feet  . Eye surgery     both cataracts  . Insert / replace / remove pacemaker 2009  . Shoulder arthroscopy 04/15/2006 - left    right:  10/18/2005, 09/24/2005  . Lumbar laminectomy/decompression microdiscectomy 02/06/2005; 10/21/2003    with fusions   . Back surgery 1996,2005  . Cardioversion 08/08/2010    for atrial fib.  . Trigger finger release 08/10/2003    right thumb  . Wrist arthroscopy 07/04/2010    left wrist; also left CTR  . Spinal growth rods   . Knee  arthroscopy     right    History reviewed. No pertinent family history. Social History:  reports that she has never smoked. She has never used smokeless tobacco. She reports that she does not drink alcohol or use illicit drugs.  Allergies:  Allergies  Allergen Reactions  . Oxycontin Other (See Comments)    HALLUCINATIONS  . Aspirin Other (See Comments)    GI UPSET  . Codeine Other (See Comments)    GI UPSET  . Sulfa Antibiotics Other (See Comments)    GI UPSET    Medications Prior to Admission  Medication Dose Route Frequency Provider Last Rate Last Dose  . lactated ringers infusion   Intravenous Continuous Charles Eugene Frederick, MD       Medications Prior to Admission  Medication Sig Dispense Refill  . cyclobenzaprine (FLEXERIL) 10 MG tablet Take 10 mg by mouth daily.       . diltiazem (CARDIZEM CD) 240 MG 24 hr capsule Take 240 mg by mouth daily. PM      . fish oil-omega-3 fatty acids 1000 MG capsule Take 2 g by mouth daily.      . losartan (COZAAR) 50 MG tablet Take 100 mg by mouth daily. AM      .   memantine (NAMENDA) 10 MG tablet Take 10 mg by mouth 2 (two) times daily.       . predniSONE (DELTASONE) 20 MG tablet Take 20 mg by mouth daily. 3 tabs days 1 and 2, 2 tabs day 3 and 4, 1 tab days 5 and 6      . rivaroxaban (XARELTO) 10 MG TABS tablet Take 20 mg by mouth daily. PM      . sotalol (BETAPACE) 80 MG tablet Take 80 mg by mouth 2 (two) times daily.       . warfarin (COUMADIN) 1 MG tablet Take 1 mg by mouth as directed. M, W, F - 2 times/day TU, TH, SA, SU - 2 tablets at night         No results found for this or any previous visit (from the past 48 hour(s)).  No results found.   Pertinent items are noted in HPI.  Blood pressure 134/79, pulse 61, temperature 98.5 F (36.9 C), temperature source Oral, resp. rate 16, height 5' 4" (1.626 m), weight 61.236 kg (135 lb), SpO2 98.00%.  General appearance: alert, cooperative and appears stated age Head:  Normocephalic, without obvious abnormality Neck: no adenopathy Resp: clear to auscultation bilaterally Cardio: regular rate and rhythm, S1, S2 normal, no murmur, click, rub or gallop GI: soft, non-tender; bowel sounds normal; no masses,  no organomegaly Extremities: extremities normal, atraumatic, no cyanosis or edema Pulses: 2+ and symmetric Skin: Skin color, texture, turgor normal. No rashes or lesions Neurologic: Grossly normal Incision/Wound: na  Assessment/Plan Diagnosis: STS right ring finger.  We have discussed the possibility of injection vs surgical release. They would prefer to go ahead and have this surgically released as this has been injected in the past without relief. This will be scheduled at [facility] as an outpatient.  Monte Zinni R 01/21/2012, 8:40 AM    

## 2012-01-21 NOTE — Discharge Instructions (Addendum)

## 2012-01-21 NOTE — Anesthesia Preprocedure Evaluation (Signed)
Anesthesia Evaluation  Patient identified by MRN, date of birth, ID band Patient awake    Reviewed: Allergy & Precautions, H&P , NPO status , Patient's Chart, lab work & pertinent test results, reviewed documented beta blocker date and time   Airway Mallampati: II TM Distance: >3 FB Neck ROM: full    Dental   Pulmonary neg pulmonary ROS,          Cardiovascular hypertension, On Medications and On Home Beta Blockers + dysrhythmias + Valvular Problems/Murmurs     Neuro/Psych Seizures -,  PSYCHIATRIC DISORDERS    GI/Hepatic negative GI ROS, Neg liver ROS,   Endo/Other  Negative Endocrine ROS  Renal/GU negative Renal ROS  Genitourinary negative   Musculoskeletal   Abdominal   Peds  Hematology negative hematology ROS (+)   Anesthesia Other Findings See surgeon's H&P   Reproductive/Obstetrics negative OB ROS                           Anesthesia Physical Anesthesia Plan  ASA: III  Anesthesia Plan: MAC and Bier Block   Post-op Pain Management:    Induction:   Airway Management Planned: Simple Face Mask  Additional Equipment:   Intra-op Plan:   Post-operative Plan: Extubation in OR  Informed Consent: I have reviewed the patients History and Physical, chart, labs and discussed the procedure including the risks, benefits and alternatives for the proposed anesthesia with the patient or authorized representative who has indicated his/her understanding and acceptance.     Plan Discussed with: CRNA and Surgeon  Anesthesia Plan Comments:         Anesthesia Quick Evaluation

## 2012-01-21 NOTE — Brief Op Note (Signed)
01/21/2012  10:41 AM  PATIENT:  Heather [name]  78 y.o. female  PRE-OPERATIVE DIAGNOSIS:  sts right ring finger  POST-OPERATIVE DIAGNOSIS:  sts right ring finger  PROCEDURE:  Procedure(s) (LRB): RELEASE TRIGGER FINGER/A-1 PULLEY (Right)  SURGEON:  Surgeon(s) and Role:    * Nicki Reaper, MD - Primary    * Tami Ribas, MD - Assisting  PHYSICIAN ASSISTANT:   ASSISTANTS: Karlyn Agee, MD   ANESTHESIA:   local and regional  EBL:  Total I/O In: 100 [I.V.:100] Out: -   BLOOD ADMINISTERED:none  DRAINS: none   LOCAL MEDICATIONS USED:  MARCAINE     SPECIMEN:  No Specimen  DISPOSITION OF SPECIMEN:  N/A  COUNTS:  YES  TOURNIQUET:   Total Tourniquet Time Documented: Forearm (Right) - 16 minutes  DICTATION: .Other Dictation: Dictation Number [id number]  PLAN OF CARE: Discharge to home after PACU  PATIENT DISPOSITION:  PACU - hemodynamically stable.

## 2012-01-21 NOTE — Anesthesia Postprocedure Evaluation (Signed)
Anesthesia Post Note  Patient: Heather [name]  Procedure(s) Performed: Procedure(s) (LRB): RELEASE TRIGGER FINGER/A-1 PULLEY (Right)  Anesthesia type: MAC  Patient location: PACU  Post pain: Pain level controlled  Post assessment: Patient's Cardiovascular Status Stable  Last Vitals:  Filed Vitals:   01/21/12 1139  BP: 151/65  Pulse: 73  Temp: 36.5 C  Resp: 16    Post vital signs: Reviewed and stable  Level of consciousness: alert  Complications: No apparent anesthesia complications

## 2012-01-22 ENCOUNTER — Encounter (HOSPITAL_BASED_OUTPATIENT_CLINIC_OR_DEPARTMENT_OTHER): Payer: Self-pay | Admitting: Orthopedic Surgery

## 2012-02-04 ENCOUNTER — Encounter (HOSPITAL_BASED_OUTPATIENT_CLINIC_OR_DEPARTMENT_OTHER): Payer: Self-pay | Admitting: *Deleted

## 2012-02-04 ENCOUNTER — Other Ambulatory Visit: Payer: Self-pay | Admitting: Orthopedic Surgery

## 2012-02-04 NOTE — Pre-Procedure Instructions (Signed)
Dr. Erin Hearing office called to notify of need to complete "Perioperative Prescription for Implanted Cardiac Device Programming" order prior to surg.; form was faxed to office, and message left for nurse to call us back.

## 2012-02-05 ENCOUNTER — Encounter (HOSPITAL_BASED_OUTPATIENT_CLINIC_OR_DEPARTMENT_OTHER): Payer: Self-pay | Admitting: Orthopedic Surgery

## 2012-02-05 ENCOUNTER — Encounter (HOSPITAL_BASED_OUTPATIENT_CLINIC_OR_DEPARTMENT_OTHER)
Admission: RE | Disposition: A | Discharge status: Home / Self Care | Payer: Self-pay | Source: Ambulatory Visit | Attending: Orthopedic Surgery

## 2012-02-05 ENCOUNTER — Encounter (HOSPITAL_BASED_OUTPATIENT_CLINIC_OR_DEPARTMENT_OTHER): Payer: Self-pay | Admitting: Certified Registered Nurse Anesthetist

## 2012-02-05 ENCOUNTER — Ambulatory Visit (HOSPITAL_BASED_OUTPATIENT_CLINIC_OR_DEPARTMENT_OTHER)
Admission: RE | Admit: 2012-02-05 | Discharge: 2012-02-05 | Disposition: A | Discharge status: Home / Self Care | Payer: Medicare Other | Source: Ambulatory Visit | Attending: Orthopedic Surgery | Admitting: Orthopedic Surgery

## 2012-02-05 ENCOUNTER — Encounter (HOSPITAL_BASED_OUTPATIENT_CLINIC_OR_DEPARTMENT_OTHER): Payer: Self-pay | Admitting: *Deleted

## 2012-02-05 ENCOUNTER — Ambulatory Visit (HOSPITAL_BASED_OUTPATIENT_CLINIC_OR_DEPARTMENT_OTHER): Payer: Medicare Other | Admitting: Certified Registered Nurse Anesthetist

## 2012-02-05 DIAGNOSIS — M545 Low back pain, unspecified: Secondary | ICD-10-CM | POA: Insufficient documentation

## 2012-02-05 DIAGNOSIS — I10 Essential (primary) hypertension: Secondary | ICD-10-CM | POA: Insufficient documentation

## 2012-02-05 DIAGNOSIS — M4120 Other idiopathic scoliosis, site unspecified: Secondary | ICD-10-CM | POA: Insufficient documentation

## 2012-02-05 DIAGNOSIS — Z7901 Long term (current) use of anticoagulants: Secondary | ICD-10-CM | POA: Insufficient documentation

## 2012-02-05 DIAGNOSIS — G8929 Other chronic pain: Secondary | ICD-10-CM | POA: Insufficient documentation

## 2012-02-05 DIAGNOSIS — E785 Hyperlipidemia, unspecified: Secondary | ICD-10-CM | POA: Insufficient documentation

## 2012-02-05 DIAGNOSIS — K08109 Complete loss of teeth, unspecified cause, unspecified class: Secondary | ICD-10-CM | POA: Insufficient documentation

## 2012-02-05 DIAGNOSIS — M1A00X1 Idiopathic chronic gout, unspecified site, with tophus (tophi): Secondary | ICD-10-CM | POA: Insufficient documentation

## 2012-02-05 DIAGNOSIS — Z950 Presence of cardiac pacemaker: Secondary | ICD-10-CM | POA: Insufficient documentation

## 2012-02-05 DIAGNOSIS — F0390 Unspecified dementia without behavioral disturbance: Secondary | ICD-10-CM | POA: Insufficient documentation

## 2012-02-05 DIAGNOSIS — I4891 Unspecified atrial fibrillation: Secondary | ICD-10-CM | POA: Insufficient documentation

## 2012-02-05 HISTORY — PX: I&D EXTREMITY: SHX5045

## 2012-02-05 LAB — PROTIME-INR: INR: 3.38 — ABNORMAL HIGH (ref 0.00–1.49)

## 2012-02-05 LAB — APTT: aPTT: 36 seconds (ref 24–37)

## 2012-02-05 SURGERY — IRRIGATION AND DEBRIDEMENT EXTREMITY
Anesthesia: Monitor Anesthesia Care | Site: Finger | Laterality: Right | Wound class: Clean Contaminated

## 2012-02-05 MED ORDER — PROPOFOL 10 MG/ML IV EMUL
INTRAVENOUS | Status: DC | PRN
  Administered 2012-02-05: 100 ug/kg/min via INTRAVENOUS

## 2012-02-05 MED ORDER — PENTAZOCINE-NALOXONE 50-0.5 MG PO TABS: 1.0000 | ORAL_TABLET | ORAL | Status: AC | PRN

## 2012-02-05 MED ORDER — MIDAZOLAM HCL 5 MG/5ML IJ SOLN: INTRAMUSCULAR | Status: DC | PRN

## 2012-02-05 MED ORDER — LACTATED RINGERS IV SOLN
INTRAVENOUS | Status: DC
  Administered 2012-02-05: 10:00:00 via INTRAVENOUS

## 2012-02-05 MED ORDER — BUPIVACAINE HCL (PF) 0.25 % IJ SOLN
INTRAMUSCULAR | Status: DC | PRN
  Administered 2012-02-05: 6 mL

## 2012-02-05 MED ORDER — MORPHINE SULFATE 2 MG/ML IJ SOLN: 0.0500 mg/kg | INTRAMUSCULAR | Status: DC | PRN

## 2012-02-05 MED ORDER — CHLORHEXIDINE GLUCONATE 4 % EX LIQD: 60.0000 mL | Freq: Once | CUTANEOUS | Status: DC

## 2012-02-05 MED ORDER — FENTANYL CITRATE 0.05 MG/ML IJ SOLN
INTRAMUSCULAR | Status: DC | PRN
  Administered 2012-02-05 (×2): 25 ug via INTRAVENOUS

## 2012-02-05 MED ORDER — CEFAZOLIN SODIUM 1-5 GM-% IV SOLN
INTRAVENOUS | Status: DC | PRN
  Administered 2012-02-05: 1 g via INTRAVENOUS

## 2012-02-05 MED ORDER — HYDROMORPHONE HCL PF 1 MG/ML IJ SOLN: 0.2500 mg | INTRAMUSCULAR | Status: DC | PRN

## 2012-02-05 MED ORDER — THROMBIN 20000 UNITS EX KIT
PACK | CUTANEOUS | Status: DC | PRN
  Administered 2012-02-05: 20000 [IU] via TOPICAL

## 2012-02-05 MED ORDER — ONDANSETRON HCL 4 MG/2ML IJ SOLN: 4.0000 mg | Freq: Once | INTRAMUSCULAR | Status: DC | PRN

## 2012-02-05 SURGICAL SUPPLY — 49 items
BAG DECANTER FOR FLEXI CONT (MISCELLANEOUS) IMPLANT
BANDAGE GAUZE ELAST BULKY 4 IN (GAUZE/BANDAGES/DRESSINGS) IMPLANT
BLADE MINI RND TIP GREEN BEAV (BLADE) IMPLANT
BLADE SURG 15 STRL LF DISP TIS (BLADE) ×1 IMPLANT
BLADE SURG 15 STRL SS (BLADE) ×1
BNDG COHESIVE 1X5 TAN STRL LF (GAUZE/BANDAGES/DRESSINGS) ×6 IMPLANT
BNDG COHESIVE 3X5 TAN STRL LF (GAUZE/BANDAGES/DRESSINGS) IMPLANT
BNDG ESMARK 4X9 LF (GAUZE/BANDAGES/DRESSINGS) ×2 IMPLANT
CHLORAPREP W/TINT 26ML (MISCELLANEOUS) ×2 IMPLANT
CLOTH BEACON ORANGE TIMEOUT ST (SAFETY) ×2 IMPLANT
CORDS BIPOLAR (ELECTRODE) IMPLANT
COVER MAYO STAND STRL (DRAPES) ×2 IMPLANT
COVER TABLE BACK 60X90 (DRAPES) ×2 IMPLANT
CUFF TOURNIQUET SINGLE 18IN (TOURNIQUET CUFF) ×2 IMPLANT
DRAPE EXTREMITY T 121X128X90 (DRAPE) ×2 IMPLANT
DRAPE SURG 17X23 STRL (DRAPES) ×2 IMPLANT
DRSG KUZMA FLUFF (GAUZE/BANDAGES/DRESSINGS) IMPLANT
GAUZE PACKING IODOFORM 1/4X5 (PACKING) IMPLANT
GAUZE XEROFORM 1X8 LF (GAUZE/BANDAGES/DRESSINGS) ×2 IMPLANT
GLOVE BIO SURGEON STRL SZ 6.5 (GLOVE) ×2 IMPLANT
GLOVE SURG ORTHO 8.0 STRL STRW (GLOVE) ×2 IMPLANT
GOWN BRE IMP PREV XXLGXLNG (GOWN DISPOSABLE) ×2 IMPLANT
GOWN PREVENTION PLUS XLARGE (GOWN DISPOSABLE) ×2 IMPLANT
LOOP VESSEL MAXI BLUE (MISCELLANEOUS) IMPLANT
NEEDLE 27GAX1X1/2 (NEEDLE) ×2 IMPLANT
NS IRRIG 1000ML POUR BTL (IV SOLUTION) ×2 IMPLANT
PACK BASIN DAY SURGERY FS (CUSTOM PROCEDURE TRAY) ×2 IMPLANT
PAD CAST 3X4 CTTN HI CHSV (CAST SUPPLIES) IMPLANT
PADDING CAST ABS 4INX4YD NS (CAST SUPPLIES)
PADDING CAST ABS COTTON 4X4 ST (CAST SUPPLIES) IMPLANT
PADDING CAST COTTON 3X4 STRL (CAST SUPPLIES)
SPLINT FINGER 5/8X3.25 (SOFTGOODS) ×1 IMPLANT
SPLINT FINGER FOAM 3 9119 05 (SOFTGOODS) ×2
SPLINT FNGR PLAIN END 5/8X3.25 (CAST SUPPLIES) ×1 IMPLANT
SPLINT PLASTALUME 3 1/4 (CAST SUPPLIES) ×2
SPLINT PLASTER CAST XFAST 3X15 (CAST SUPPLIES) IMPLANT
SPLINT PLASTER XTRA FASTSET 3X (CAST SUPPLIES)
SPONGE GAUZE 4X4 12PLY (GAUZE/BANDAGES/DRESSINGS) ×2 IMPLANT
STOCKINETTE 4X48 STRL (DRAPES) ×2 IMPLANT
SUT VICRYL RAPID 5 0 P 3 (SUTURE) IMPLANT
SUT VICRYL RAPIDE 4/0 PS 2 (SUTURE) ×2 IMPLANT
SWAB CULTURE LIQ STUART DBL (MISCELLANEOUS) ×2 IMPLANT
SYR BULB 3OZ (MISCELLANEOUS) ×2 IMPLANT
SYR CONTROL 10ML LL (SYRINGE) ×2 IMPLANT
TOWEL OR 17X24 6PK STRL BLUE (TOWEL DISPOSABLE) ×4 IMPLANT
TUBE ANAEROBIC SPECIMEN COL (MISCELLANEOUS) ×2 IMPLANT
TUBE FEEDING 5FR 15 INCH (TUBING) IMPLANT
UNDERPAD 30X30 INCONTINENT (UNDERPADS AND DIAPERS) ×2 IMPLANT
WATER STERILE IRR 1000ML POUR (IV SOLUTION) IMPLANT

## 2012-02-05 NOTE — Brief Op Note (Signed)
02/05/2012  11:57 AM  PATIENT:  Heather [name]  78 y.o. female  PRE-OPERATIVE DIAGNOSIS:  gout right index and middle fingers  POST-OPERATIVE DIAGNOSIS:  gout right index and middle fingers  PROCEDURE:  Procedure(s) (LRB): IRRIGATION AND DEBRIDEMENT EXTREMITY (Right)  SURGEON:  Surgeon(s) and Role:    * Nicki Reaper, MD - Primary  PHYSICIAN ASSISTANT:   ASSISTANTS: none   ANESTHESIA:   local and regional  EBL:  Total I/O In: 600 [I.V.:600] Out: -   BLOOD ADMINISTERED:none  DRAINS: none   LOCAL MEDICATIONS USED:  MARCAINE     SPECIMEN:  No Specimen  DISPOSITION OF SPECIMEN:  N/A  COUNTS:  YES  TOURNIQUET:   Total Tourniquet Time Documented: Forearm (Right) - 26 minutes  DICTATION: .Other Dictation: Dictation Number [id number]  PLAN OF CARE: Discharge to home after PACU  PATIENT DISPOSITION:  PACU - hemodynamically stable.

## 2012-02-05 NOTE — Op Note (Signed)
Dictated number: [id number]

## 2012-02-05 NOTE — Anesthesia Postprocedure Evaluation (Signed)
  Anesthesia Post-op Note  Patient: Heather [name]  Procedure(s) Performed: Procedure(s) (LRB): IRRIGATION AND DEBRIDEMENT EXTREMITY (Right)  Patient Location: PACU  Anesthesia Type: Bier block  Level of Consciousness: awake, alert  and oriented  Airway and Oxygen Therapy: Patient Spontanous Breathing  Post-op Pain: mild  Post-op Assessment: Post-op Vital signs reviewed, Patient's Cardiovascular Status Stable, Respiratory Function Stable, Patent Airway, No signs of Nausea or vomiting and Pain level controlled  Post-op Vital Signs: Reviewed and stable  Complications: No apparent anesthesia complications

## 2012-02-05 NOTE — Discharge Instructions (Addendum)
[facility] Instructions Hand Surgery  Wound Care: Keep your hand elevated above the level of your heart.  Do not allow it to dangle  by your side.  Keep the dressing dry and do not remove it unless your doctor advises you to do so.  He will usually change it at the time of your post-op visit.  Moving your fingers is advised to stimulate circulation but will depend on the site of your surgery.  If you have a splint applied, your doctor will advise you regarding movement.  Activity: Do not drive or operate machinery today.  Rest today and then you may return to your normal activity and work as indicated by your physician.  Diet:  Drink liquids today or eat a light diet.  You may resume a regular diet tomorrow.    General expectations: Pain for two to three days. Fingers may become slightly swollen.  Call your doctor if any of the following occur: Severe pain not relieved by pain medication. Elevated temperature. Dressing soaked with blood. Inability to move fingers. White or bluish color to fingers.[facility]  [address] [place], [place] [address] [phone]   Post Anesthesia Home Care Instructions  Activity: Get plenty of rest for the remainder of the day. A responsible adult should stay with you for 24 hours following the procedure.  For the next 24 hours, DO NOT: -Drive a car -Operate machinery -Drink alcoholic beverages -Take any medication unless instructed by your physician -Make any legal decisions or sign important papers.  Meals: Start with liquid foods such as gelatin or soup. Progress to regular foods as tolerated. Avoid greasy, spicy, heavy foods. If nausea and/or vomiting occur, drink only clear liquids until the nausea and/or vomiting subsides. Call your physician if vomiting continues.  Special Instructions/Symptoms: Your throat may feel dry or sore from the anesthesia or the breathing tube placed in your throat during surgery. If  this causes discomfort, gargle with warm salt water. The discomfort should disappear within 24 hours.   

## 2012-02-05 NOTE — Anesthesia Preprocedure Evaluation (Addendum)
Anesthesia Evaluation  Patient identified by MRN, date of birth, ID band Patient awake and Patient confused    Reviewed: Allergy & Precautions, H&P , NPO status , Patient's Chart, lab work & pertinent test results  Airway Mallampati: II TM Distance: >3 FB Neck ROM: Full    Dental  (+) Upper Dentures, Lower Dentures and Dental Advisory Given   Pulmonary  breath sounds clear to auscultation        Cardiovascular Rhythm:Regular Rate:Normal     Neuro/Psych    GI/Hepatic   Endo/Other    Renal/GU      Musculoskeletal   Abdominal   Peds  Hematology   Anesthesia Other Findings   Reproductive/Obstetrics                          Anesthesia Physical Anesthesia Plan  ASA: III  Anesthesia Plan: Bier Block and MAC   Post-op Pain Management:    Induction: Intravenous  Airway Management Planned: Simple Face Mask  Additional Equipment:   Intra-op Plan:   Post-operative Plan:   Informed Consent: I have reviewed the patients History and Physical, chart, labs and discussed the procedure including the risks, benefits and alternatives for the proposed anesthesia with the patient or authorized representative who has indicated his/her understanding and acceptance.   Dental advisory given  Plan Discussed with: CRNA, Anesthesiologist and Surgeon  Anesthesia Plan Comments:         Anesthesia Quick Evaluation

## 2012-02-05 NOTE — Transfer of Care (Signed)
Immediate Anesthesia Transfer of Care Note  Patient: Heather [name]  Procedure(s) Performed: Procedure(s) (LRB): IRRIGATION AND DEBRIDEMENT EXTREMITY (Right)  Patient Location: PACU  Anesthesia Type: Bier block  Level of Consciousness: awake, alert  and patient cooperative  Airway & Oxygen Therapy: Patient Spontanous Breathing and Patient connected to face mask oxygen  Post-op Assessment: Report given to PACU RN, Post -op Vital signs reviewed and stable and Patient moving all extremities  Post vital signs: Reviewed and stable  Complications: No apparent anesthesia complications

## 2012-02-05 NOTE — H&P (View-Only) (Signed)
Heather [name]  has not been seen in 6 months. She comes in complaining of catching of her right ring finger. This has been going on for 2 months. She has recently placed a splint for comfort. She complains of pain at the MCP joint. She has had multiple digits in the past without relief. She complains of constant pain. She is not complaining of any numbness or tingling.   Past Medical History:  She is allergic to Codeine and aspirin. She is on Namenda, Metoprolol, Losartan, Tramadol, Cyclobenzaprine and Amlodipine. She has had a suspensionplasty done, multiple trigger finger releases.  Family Medical History: Positive for heart disease and high BP.  Social History: she does not smoke or drink. She is not working at the present time.  Review of Systems: Positive for glasses, otherwise negative for 14 points. Heather [name] is an 78 y.o. female.   Chief Complaint: sts rrf HPI: see above  Past Medical History  Diagnosis Date  . Hyperlipidemia   . Chronic LBP   . Gout 01/2012    right hand  . Dysrhythmia     atrial fib  . Pacemaker   . Seizures 2009    seizure x 1; none since pacemaker insertion  . Dementia   . Scoliosis   . Hypertension     under control    Past Surgical History  Procedure Date  . Tonsillectomy   . Hernia repair   . Trigger finger release 04/24/2011    release A-1 pulley left index  . Carpal tunnel release 11/14/2009    right  . Appendectomy   . Abdominal hysterectomy   . Heel spur surgery     both feet  . Eye surgery     both cataracts  . Insert / replace / remove pacemaker 2009  . Shoulder arthroscopy 04/15/2006 - left    right:  10/18/2005, 09/24/2005  . Lumbar laminectomy/decompression microdiscectomy 02/06/2005; 10/21/2003    with fusions   . Back surgery 4098,1191  . Cardioversion 08/08/2010    for atrial fib.  . Trigger finger release 08/10/2003    right thumb  . Wrist arthroscopy 07/04/2010    left wrist; also left CTR  . Spinal growth rods   . Knee  arthroscopy     right    History reviewed. No pertinent family history. Social History:  reports that she has never smoked. She has never used smokeless tobacco. She reports that she does not drink alcohol or use illicit drugs.  Allergies:  Allergies  Allergen Reactions  . Oxycontin Other (See Comments)    HALLUCINATIONS  . Aspirin Other (See Comments)    GI UPSET  . Codeine Other (See Comments)    GI UPSET  . Sulfa Antibiotics Other (See Comments)    GI UPSET    Medications Prior to Admission  Medication Dose Route Frequency Provider Last Rate Last Dose  . lactated ringers infusion   Intravenous Continuous Constance Goltz, MD       Medications Prior to Admission  Medication Sig Dispense Refill  . cyclobenzaprine (FLEXERIL) 10 MG tablet Take 10 mg by mouth daily.       Marland Kitchen diltiazem (CARDIZEM CD) 240 MG 24 hr capsule Take 240 mg by mouth daily. PM      . fish oil-omega-3 fatty acids 1000 MG capsule Take 2 g by mouth daily.      Marland Kitchen losartan (COZAAR) 50 MG tablet Take 100 mg by mouth daily. AM      .  memantine (NAMENDA) 10 MG tablet Take 10 mg by mouth 2 (two) times daily.       . predniSONE (DELTASONE) 20 MG tablet Take 20 mg by mouth daily. 3 tabs days 1 and 2, 2 tabs day 3 and 4, 1 tab days 5 and 6      . rivaroxaban (XARELTO) 10 MG TABS tablet Take 20 mg by mouth daily. PM      . sotalol (BETAPACE) 80 MG tablet Take 80 mg by mouth 2 (two) times daily.       Marland Kitchen warfarin (COUMADIN) 1 MG tablet Take 1 mg by mouth as directed. M, W, F - 2 times/day TU, TH, SA, SU - 2 tablets at night         No results found for this or any previous visit (from the past 48 hour(s)).  No results found.   Pertinent items are noted in HPI.  Blood pressure 134/79, pulse 61, temperature 98.5 F (36.9 C), temperature source Oral, resp. rate 16, height 5\' 4"  (1.626 m), weight 61.236 kg (135 lb), SpO2 98.00%.  General appearance: alert, cooperative and appears stated age Head:  Normocephalic, without obvious abnormality Neck: no adenopathy Resp: clear to auscultation bilaterally Cardio: regular rate and rhythm, S1, S2 normal, no murmur, click, rub or gallop GI: soft, non-tender; bowel sounds normal; no masses,  no organomegaly Extremities: extremities normal, atraumatic, no cyanosis or edema Pulses: 2+ and symmetric Skin: Skin color, texture, turgor normal. No rashes or lesions Neurologic: Grossly normal Incision/Wound: na  Assessment/Plan Diagnosis: STS right ring finger.  We have discussed the possibility of injection vs surgical release. They would prefer to go ahead and have this surgically released as this has been injected in the past without relief. This will be scheduled at [facility] as an outpatient.  Jenie Parish R 01/21/2012, 8:40 AM

## 2012-02-05 NOTE — Interval H&P Note (Signed)
History and Physical Interval Note:  02/05/2012 6:12 AM  Heather [name]  has presented today for surgery, with the diagnosis of gout right index and middle fingers  The various methods of treatment have been discussed with the patient and family. After consideration of risks, benefits and other options for treatment, the patient has consented to  Procedure(s) (LRB): IRRIGATION AND DEBRIDEMENT EXTREMITY (Right) as a surgical intervention .  The patients' history has been reviewed, patient examined, no change in status, stable for surgery.  I have reviewed the patients' chart and labs.  Questions were answered to the patient's satisfaction.     [name] has developed an acute gouty attack and tophus on er index and middle fingers  The index tophus has eroded through the skin and requires debridement. She would also like the middle de-bulked at this time. This has not responded to conservative treatment. Physical updated in the office on 02-04-12.

## 2012-02-05 NOTE — Anesthesia Procedure Notes (Signed)
Procedure Name: MAC Performed by: Lynea Rollison D Pre-anesthesia Checklist: Patient identified, Timeout performed, Emergency Drugs available, Suction available and Patient being monitored Patient Re-evaluated:Patient Re-evaluated prior to inductionOxygen Delivery Method: Simple face mask Preoxygenation: Pre-oxygenation with 100% oxygen Ventilation: Mask ventilation without difficulty Placement Confirmation: positive ETCO2

## 2012-02-06 ENCOUNTER — Encounter (HOSPITAL_BASED_OUTPATIENT_CLINIC_OR_DEPARTMENT_OTHER): Payer: Self-pay | Admitting: Orthopedic Surgery

## 2012-02-06 LAB — POCT I-STAT, CHEM 8
BUN: 24 mg/dL — ABNORMAL HIGH (ref 6–23)
Calcium, Ion: 1.12 mmol/L (ref 1.12–1.32)
Chloride: 110 mEq/L (ref 96–112)
Creatinine, Ser: 1.3 mg/dL — ABNORMAL HIGH (ref 0.50–1.10)
Glucose, Bld: 82 mg/dL (ref 70–99)
TCO2: 21 mmol/L (ref 0–100)

## 2012-02-06 NOTE — Op Note (Signed)
NAME:  Heather [name], Heather [name]                      ACCOUNT NO.:  MEDICAL RECORD NO.:  [id number]  LOCATION:                                 FACILITY:  PHYSICIAN:  Cindee Salt, M.D.            DATE OF BIRTH:  DATE OF PROCEDURE:  02/05/2012 DATE OF DISCHARGE:                              OPERATIVE REPORT   PREOPERATIVE DIAGNOSIS:  Gout with draining tophus, right index, right middle finger.  POSTOPERATIVE DIAGNOSIS:  Gout with draining tophus, right index, right middle finger.  OPERATION:  Excision and sharp debridement, gouty tophus, right index, right middle fingers with cultures.  SURGEON:  Cindee Salt, MD  ANESTHESIA:  Forearm based IV regional with metacarpal block.  ANESTHESIOLOGIST:  Cruz.  HISTORY:  The patient is a 78 year old female with a history of gout, underwent release trigger finger, following this developed a gouty attack with a large tophus on her index finger, distal interphalangeal joint, which has ruptured and 2 tophi on her middle finger PIP joint. She is admitted for debridement.  Pre, peri, postoperative course been discussed along with risks and complications that there is no guarantee with the surgery, possibility of infection, recurrence, injury to arteries, nerves, tendons, incomplete relief of symptoms, and dystrophy. In the preoperative area, the patient was seen, the extremity marked by both the patient and surgeon.  PROCEDURE IN DETAIL:  The patient was brought to the operating room where a forearm based IV regional anesthetic was carried out without difficulty.  She was prepped using ChloraPrep, supine position, right arm free.  A 3-minute dry time was allowed.  Time-out taken, confirming the patient procedure.  The index finger was approached first.  The open area was debrided sharply with care to protect the extensor tendon.  The debridement revealed typical gouty tophus, being present.  This was thoroughly excised.  The skin was debrided back to  normal edge.  Due to her being on Coumadin, this was then copiously irrigated with saline and thrombin sprayed into the wound.  An incision was made over the PIP joint of the middle finger transversely, 2 tophi were immediately encountered.  These were both fluid-filled along with hard tophus.  With blunt and sharp dissection, these were removed.  The total area of debridement measured approximately 3 cm on the middle finger and 1-1.5 cm on the index finger.  The wound was irrigated.  The thrombin spray sprayed into the wound on the PIP joint, and the wound closed with interrupted 4-0 Vicryl Rapide sutures.  A metacarpal block was given to each finger using a 0.25% Marcaine without epinephrine, approximately 6.5 mL was used.  Sterile compressive dressing and splint was applied to the fingers.  Tourniquet deflated.  Pressure maintained on each of the fingers for 4-5 minutes.  The remaining fingers pinked very rapidly. She was taken to the recovery room for observation in satisfactory condition.  It was noted that cultures were taken of the index finger for both aerobic and anaerobic cultures.          ______________________________ Cindee Salt, M.D.     GK/MEDQ  D:  02/05/2012  T:  02/06/2012  Job:  [id number]

## 2012-02-08 LAB — WOUND CULTURE

## 2012-02-10 LAB — ANAEROBIC CULTURE

## 2012-10-15 ENCOUNTER — Other Ambulatory Visit (HOSPITAL_COMMUNITY): Payer: Self-pay | Admitting: Cardiology

## 2012-10-15 DIAGNOSIS — R011 Cardiac murmur, unspecified: Secondary | ICD-10-CM

## 2012-11-11 ENCOUNTER — Ambulatory Visit (HOSPITAL_COMMUNITY)
Admission: RE | Admit: 2012-11-11 | Discharge: 2012-11-11 | Disposition: A | Discharge status: Home / Self Care | Payer: Medicare Other | Source: Ambulatory Visit | Attending: Cardiovascular Disease | Admitting: Cardiovascular Disease

## 2012-11-11 DIAGNOSIS — I4891 Unspecified atrial fibrillation: Secondary | ICD-10-CM | POA: Insufficient documentation

## 2012-11-11 DIAGNOSIS — I079 Rheumatic tricuspid valve disease, unspecified: Secondary | ICD-10-CM | POA: Insufficient documentation

## 2012-11-11 DIAGNOSIS — R011 Cardiac murmur, unspecified: Secondary | ICD-10-CM | POA: Insufficient documentation

## 2012-11-11 DIAGNOSIS — I517 Cardiomegaly: Secondary | ICD-10-CM | POA: Insufficient documentation

## 2012-11-11 DIAGNOSIS — I359 Nonrheumatic aortic valve disorder, unspecified: Secondary | ICD-10-CM | POA: Insufficient documentation

## 2012-11-11 DIAGNOSIS — E785 Hyperlipidemia, unspecified: Secondary | ICD-10-CM | POA: Insufficient documentation

## 2012-11-11 DIAGNOSIS — I10 Essential (primary) hypertension: Secondary | ICD-10-CM | POA: Insufficient documentation

## 2012-11-11 NOTE — Progress Notes (Signed)
2D Echo Performed 11/11/2012    Dorotea Hand, RCS  

## 2013-03-06 ENCOUNTER — Other Ambulatory Visit: Payer: Self-pay | Admitting: Family Medicine

## 2013-03-15 ENCOUNTER — Encounter (HOSPITAL_BASED_OUTPATIENT_CLINIC_OR_DEPARTMENT_OTHER): Payer: Self-pay | Admitting: *Deleted

## 2013-03-15 ENCOUNTER — Other Ambulatory Visit: Payer: Self-pay | Admitting: Orthopedic Surgery

## 2013-03-15 ENCOUNTER — Ambulatory Visit (INDEPENDENT_AMBULATORY_CARE_PROVIDER_SITE_OTHER): Payer: Medicare Other | Admitting: Family Medicine

## 2013-03-15 ENCOUNTER — Encounter: Payer: Self-pay | Admitting: Family Medicine

## 2013-03-15 VITALS — BP 120/64 | HR 62 | Temp 97.7°F | Resp 14 | Wt 139.0 lb

## 2013-03-15 DIAGNOSIS — I4891 Unspecified atrial fibrillation: Secondary | ICD-10-CM

## 2013-03-15 DIAGNOSIS — R1013 Epigastric pain: Secondary | ICD-10-CM

## 2013-03-15 LAB — PT WITH INR/FINGERSTICK: PT, fingerstick: 26.4 seconds — ABNORMAL HIGH (ref 10.4–12.5)

## 2013-03-15 MED ORDER — ESOMEPRAZOLE MAGNESIUM 40 MG PO CPDR: 40.0000 mg | DELAYED_RELEASE_CAPSULE | Freq: Every day | ORAL | Status: DC

## 2013-03-15 MED ORDER — PHYTONADIONE 5 MG PO TABS: 5.0000 mg | ORAL_TABLET | Freq: Once | ORAL | Status: DC

## 2013-03-15 NOTE — Progress Notes (Signed)
Pt has been her several time for trigger fingers -saw dr pickard this am-gave her vit k and to stop coumadin for surgery-to come in tomorrow for pt ptt inr-bmet-will call dr croitoru for last ekg Pace form faxed

## 2013-03-15 NOTE — Progress Notes (Signed)
Subjective:    Patient ID: Heather [name], female    DOB: [birth year], 79 y.o.   MRN: [id number]  HPI Patient has a history of atrial fibrillation. She is currently anticoagulated with Coumadin. She takes 2 mg by mouth daily except on Tuesdays and Thursdays. On Tuesdays and Thursdays she takes 3 mg a day.  She is here for 9 on check. Her INR is 2.2 and therapeutic. However she is planning on having surgery on her hand in less than 48 hours.  She has taken her Coumadin today.  They wonder if there is anything we can do to reverse the coumadin so that she does not have to cancel her surgery.    At her last office visit she is also complaining of left upper quadrant abdominal discomfort with food. We prescribe her Nexium 40 mg by mouth daily. She states that this is totally resolved her abdominal discomfort. She no longer has postprandial nausea. She is not having any reflux. She like to continue the medication. Past Medical History  Diagnosis Date  . Hyperlipidemia   . Chronic LBP   . Gout 01/2012    right index and middle fingers  . Dysrhythmia     atrial fib  . Pacemaker   . Seizures 2009    seizure x 1; none since pacemaker insertion  . Dementia   . Scoliosis   . Hypertension     under control   Current Outpatient Prescriptions on File Prior to Visit  Medication Sig Dispense Refill  . diltiazem (CARDIZEM CD) 240 MG 24 hr capsule Take 240 mg by mouth daily. PM      . fish oil-omega-3 fatty acids 1000 MG capsule Take 2 g by mouth daily.      . furosemide (LASIX) 40 MG tablet TAKE 1 TABLET BY MOUTH DAILY AS NEEDED FOR LEG SWELLING  30 tablet  3  . losartan (COZAAR) 50 MG tablet Take 100 mg by mouth daily. AM      . memantine (NAMENDA) 10 MG tablet Take 10 mg by mouth 2 (two) times daily.       . sotalol (BETAPACE) 80 MG tablet Take 80 mg by mouth 2 (two) times daily.       Marland Kitchen warfarin (COUMADIN) 1 MG tablet 1 mg as directed. 2 mg qd except 3 mg Tues/Thurs       No current  facility-administered medications on file prior to visit.   History   Social History  . Marital Status: Married    Spouse Name: N/A    Number of Children: N/A  . Years of Education: N/A   Occupational History  . Not on file.   Social History Main Topics  . Smoking status: Never Smoker   . Smokeless tobacco: Never Used  . Alcohol Use: No  . Drug Use: No  . Sexually Active:    Other Topics Concern  . Not on file   Social History Narrative  . No narrative on file      Review of Systems     Objective:   Physical Exam  Cardiovascular: An irregularly irregular rhythm present.  Pulmonary/Chest: Effort normal and breath sounds normal.  Abdominal: Soft. Bowel sounds are normal. There is no tenderness.          Assessment & Plan:  Problem #1 atrial fibrillation-  she is therapeutic on her Coumadin. However we will give her vitamin K 5 mg by mouth x1. She is instructed not to take her Coumadin  tomorrow. Hopefully that will reverse her Coumadin and so that she can proceed with surgery. I did recommend a check and I are prior surgery. We can do that here or at the surgical Center. The patient's husband states he will contact the surgeon and let [name] decide  After the surgery she is to resume her Coumadin his present doses is adequately thinning her blood.  #2 GERD/abdominal pain. Patient is to continue Nexium 40 mg by mouth daily.  Recheck in one month.

## 2013-03-16 ENCOUNTER — Encounter (HOSPITAL_BASED_OUTPATIENT_CLINIC_OR_DEPARTMENT_OTHER)
Admission: RE | Admit: 2013-03-16 | Discharge: 2013-03-16 | Disposition: A | Discharge status: Home / Self Care | Payer: Medicare Other | Source: Ambulatory Visit | Attending: Orthopedic Surgery | Admitting: Orthopedic Surgery

## 2013-03-16 LAB — PROTIME-INR: INR: 1.54 — ABNORMAL HIGH (ref 0.00–1.49)

## 2013-03-16 LAB — BASIC METABOLIC PANEL
BUN: 22 mg/dL (ref 6–23)
CO2: 26 mEq/L (ref 19–32)
Chloride: 107 mEq/L (ref 96–112)
GFR calc Af Amer: 44 mL/min — ABNORMAL LOW (ref >90)
Potassium: 4.3 mEq/L (ref 3.5–5.1)

## 2013-03-16 NOTE — Progress Notes (Signed)
Dr Jean Rosenthal ok with pt here-pt ptt done today

## 2013-03-17 ENCOUNTER — Encounter (HOSPITAL_BASED_OUTPATIENT_CLINIC_OR_DEPARTMENT_OTHER): Payer: Self-pay

## 2013-03-17 ENCOUNTER — Encounter (HOSPITAL_BASED_OUTPATIENT_CLINIC_OR_DEPARTMENT_OTHER): Payer: Self-pay | Admitting: Certified Registered"

## 2013-03-17 ENCOUNTER — Ambulatory Visit (HOSPITAL_BASED_OUTPATIENT_CLINIC_OR_DEPARTMENT_OTHER)
Admission: RE | Admit: 2013-03-17 | Discharge: 2013-03-17 | Disposition: A | Discharge status: Home / Self Care | Payer: Medicare Other | Source: Ambulatory Visit | Attending: Orthopedic Surgery | Admitting: Orthopedic Surgery

## 2013-03-17 ENCOUNTER — Ambulatory Visit (HOSPITAL_BASED_OUTPATIENT_CLINIC_OR_DEPARTMENT_OTHER): Payer: Medicare Other | Admitting: Certified Registered"

## 2013-03-17 ENCOUNTER — Encounter (HOSPITAL_BASED_OUTPATIENT_CLINIC_OR_DEPARTMENT_OTHER)
Admission: RE | Disposition: A | Discharge status: Home / Self Care | Payer: Self-pay | Source: Ambulatory Visit | Attending: Orthopedic Surgery

## 2013-03-17 DIAGNOSIS — I10 Essential (primary) hypertension: Secondary | ICD-10-CM | POA: Insufficient documentation

## 2013-03-17 DIAGNOSIS — Z7901 Long term (current) use of anticoagulants: Secondary | ICD-10-CM | POA: Insufficient documentation

## 2013-03-17 DIAGNOSIS — M6740 Ganglion, unspecified site: Secondary | ICD-10-CM | POA: Insufficient documentation

## 2013-03-17 DIAGNOSIS — M6530 Trigger finger, unspecified finger: Secondary | ICD-10-CM | POA: Insufficient documentation

## 2013-03-17 DIAGNOSIS — E785 Hyperlipidemia, unspecified: Secondary | ICD-10-CM | POA: Insufficient documentation

## 2013-03-17 DIAGNOSIS — I4891 Unspecified atrial fibrillation: Secondary | ICD-10-CM | POA: Insufficient documentation

## 2013-03-17 DIAGNOSIS — K219 Gastro-esophageal reflux disease without esophagitis: Secondary | ICD-10-CM | POA: Insufficient documentation

## 2013-03-17 HISTORY — DX: Complete loss of teeth, unspecified cause, unspecified class: Z97.2

## 2013-03-17 HISTORY — DX: Presence of spectacles and contact lenses: Z97.3

## 2013-03-17 HISTORY — DX: Complete loss of teeth, unspecified cause, unspecified class: K08.109

## 2013-03-17 HISTORY — PX: TRIGGER FINGER RELEASE: SHX641

## 2013-03-17 SURGERY — RELEASE, A1 PULLEY, FOR TRIGGER FINGER
Anesthesia: Monitor Anesthesia Care | Site: Hand | Laterality: Left | Wound class: Clean

## 2013-03-17 MED ORDER — THROMBIN 20000 UNITS EX KIT
PACK | CUTANEOUS | Status: DC | PRN
  Administered 2013-03-17: 20000 [IU] via TOPICAL

## 2013-03-17 MED ORDER — PROPOFOL 10 MG/ML IV EMUL
INTRAVENOUS | Status: DC | PRN
  Administered 2013-03-17: 75 ug/kg/min via INTRAVENOUS

## 2013-03-17 MED ORDER — LACTATED RINGERS IV SOLN
INTRAVENOUS | Status: DC
  Administered 2013-03-17: 09:00:00 via INTRAVENOUS

## 2013-03-17 MED ORDER — CEFAZOLIN SODIUM-DEXTROSE 2-3 GM-% IV SOLR
2.0000 g | INTRAVENOUS | Status: AC
  Administered 2013-03-17: 2 g via INTRAVENOUS

## 2013-03-17 MED ORDER — BUPIVACAINE-EPINEPHRINE PF 0.25-1:200000 % IJ SOLN
INTRAMUSCULAR | Status: DC | PRN
  Administered 2013-03-17: 7 mL

## 2013-03-17 MED ORDER — LIDOCAINE HCL (CARDIAC) 20 MG/ML IV SOLN: INTRAVENOUS | Status: DC | PRN

## 2013-03-17 MED ORDER — LIDOCAINE HCL (PF) 0.5 % IJ SOLN
INTRAMUSCULAR | Status: DC | PRN
  Administered 2013-03-17: 30 mL via INTRAVENOUS

## 2013-03-17 MED ORDER — ONDANSETRON HCL 4 MG/2ML IJ SOLN
INTRAMUSCULAR | Status: DC | PRN
  Administered 2013-03-17: 4 mg via INTRAVENOUS

## 2013-03-17 MED ORDER — 0.9 % SODIUM CHLORIDE (POUR BTL) OPTIME
TOPICAL | Status: DC | PRN
  Administered 2013-03-17: 1000 mL

## 2013-03-17 MED ORDER — CEFAZOLIN SODIUM-DEXTROSE 2-3 GM-% IV SOLR: 2.0000 g | INTRAVENOUS | Status: DC

## 2013-03-17 MED ORDER — FENTANYL CITRATE 0.05 MG/ML IJ SOLN: 25.0000 ug | INTRAMUSCULAR | Status: DC | PRN

## 2013-03-17 MED ORDER — CHLORHEXIDINE GLUCONATE 4 % EX LIQD: 60.0000 mL | Freq: Once | CUTANEOUS | Status: DC

## 2013-03-17 MED ORDER — PENTAZOCINE-NALOXONE 50-0.5 MG PO TABS: 1.0000 | ORAL_TABLET | ORAL | Status: DC | PRN

## 2013-03-17 MED ORDER — FENTANYL CITRATE 0.05 MG/ML IJ SOLN
INTRAMUSCULAR | Status: DC | PRN
  Administered 2013-03-17: 50 ug via INTRAVENOUS
  Administered 2013-03-17: 25 ug via INTRAVENOUS

## 2013-03-17 SURGICAL SUPPLY — 35 items
BANDAGE COBAN STERILE 2 (GAUZE/BANDAGES/DRESSINGS) ×2 IMPLANT
BLADE SURG 15 STRL LF DISP TIS (BLADE) ×1 IMPLANT
BLADE SURG 15 STRL SS (BLADE) ×1
BNDG ESMARK 4X9 LF (GAUZE/BANDAGES/DRESSINGS) IMPLANT
CHLORAPREP W/TINT 26ML (MISCELLANEOUS) ×2 IMPLANT
CLOTH BEACON ORANGE TIMEOUT ST (SAFETY) ×2 IMPLANT
CORDS BIPOLAR (ELECTRODE) ×2 IMPLANT
COVER MAYO STAND STRL (DRAPES) ×2 IMPLANT
COVER TABLE BACK 60X90 (DRAPES) ×2 IMPLANT
CUFF TOURNIQUET SINGLE 18IN (TOURNIQUET CUFF) ×2 IMPLANT
DECANTER SPIKE VIAL GLASS SM (MISCELLANEOUS) IMPLANT
DRAPE EXTREMITY T 121X128X90 (DRAPE) ×2 IMPLANT
DRAPE SURG 17X23 STRL (DRAPES) ×2 IMPLANT
GAUZE XEROFORM 1X8 LF (GAUZE/BANDAGES/DRESSINGS) ×2 IMPLANT
GLOVE BIO SURGEON STRL SZ 6.5 (GLOVE) ×2 IMPLANT
GLOVE BIOGEL PI IND STRL 7.0 (GLOVE) ×1 IMPLANT
GLOVE BIOGEL PI IND STRL 8.5 (GLOVE) ×1 IMPLANT
GLOVE BIOGEL PI INDICATOR 7.0 (GLOVE) ×1
GLOVE BIOGEL PI INDICATOR 8.5 (GLOVE) ×1
GLOVE ECLIPSE 6.5 STRL STRAW (GLOVE) ×2 IMPLANT
GLOVE SURG ORTHO 8.0 STRL STRW (GLOVE) ×2 IMPLANT
GOWN BRE IMP PREV XXLGXLNG (GOWN DISPOSABLE) ×2 IMPLANT
GOWN PREVENTION PLUS XLARGE (GOWN DISPOSABLE) ×2 IMPLANT
NEEDLE 27GAX1X1/2 (NEEDLE) IMPLANT
NS IRRIG 1000ML POUR BTL (IV SOLUTION) ×2 IMPLANT
PACK BASIN DAY SURGERY FS (CUSTOM PROCEDURE TRAY) ×2 IMPLANT
PADDING CAST ABS 4INX4YD NS (CAST SUPPLIES) ×1
PADDING CAST ABS COTTON 4X4 ST (CAST SUPPLIES) ×1 IMPLANT
SPONGE GAUZE 4X4 12PLY (GAUZE/BANDAGES/DRESSINGS) ×2 IMPLANT
STOCKINETTE 4X48 STRL (DRAPES) ×2 IMPLANT
SUT VICRYL RAPIDE 4/0 PS 2 (SUTURE) ×2 IMPLANT
SYR BULB 3OZ (MISCELLANEOUS) ×2 IMPLANT
SYR CONTROL 10ML LL (SYRINGE) ×2 IMPLANT
TOWEL OR 17X24 6PK STRL BLUE (TOWEL DISPOSABLE) ×4 IMPLANT
UNDERPAD 30X30 INCONTINENT (UNDERPADS AND DIAPERS) ×2 IMPLANT

## 2013-03-17 NOTE — Op Note (Signed)
Dictation Number [id number]

## 2013-03-17 NOTE — Transfer of Care (Signed)
Immediate Anesthesia Transfer of Care Note  Patient: Heather [name]  Procedure(s) Performed: Procedure(s): RELEASE TRIGGER FINGER/A-1 PULLEY LEFT MIDDLE, RING AND SMALL FINGERS (Left)  Patient Location: PACU  Anesthesia Type:Bier block  Level of Consciousness: awake and sedated  Airway & Oxygen Therapy: Patient Spontanous Breathing and Patient connected to face mask oxygen  Post-op Assessment: Report given to PACU RN and Post -op Vital signs reviewed and stable  Post vital signs: Reviewed and stable  Complications: No apparent anesthesia complications

## 2013-03-17 NOTE — Brief Op Note (Signed)
03/17/2013  10:54 AM  PATIENT:  Heather [name]  79 y.o. female  PRE-OPERATIVE DIAGNOSIS:  STENOSING TENOSYNOVITIS LEFT MIDDLE, RING AND LITTLE FINGERS  POST-OPERATIVE DIAGNOSIS:  STENOSING TENOSYNOVITIS LEFT MIDDLE, RING AND LITTLE FINGERS  PROCEDURE:  Procedure(s): RELEASE TRIGGER FINGER/A-1 PULLEY LEFT MIDDLE, RING AND SMALL FINGERS (Left)  SURGEON:  Surgeon(s) and Role:    * Nicki Reaper, MD - Primary  PHYSICIAN ASSISTANT:   ASSISTANTS: none   ANESTHESIA:   local and regional  EBL:  Total I/O In: 700 [I.V.:700] Out: -   BLOOD ADMINISTERED:none  DRAINS: none   LOCAL MEDICATIONS USED:  MARCAINE     SPECIMEN:  No Specimen  DISPOSITION OF SPECIMEN:  N/A  COUNTS:  YES  TOURNIQUET:   Total Tourniquet Time Documented: Forearm (Left) - 27 minutes Total: Forearm (Left) - 27 minutes   DICTATION: .Other Dictation: Dictation Number [id number]  PLAN OF CARE: Discharge to home after PACU  PATIENT DISPOSITION:  PACU - hemodynamically stable.

## 2013-03-17 NOTE — Anesthesia Postprocedure Evaluation (Signed)
  Anesthesia Post-op Note  Patient: Heather [name]  Procedure(s) Performed: Procedure(s): RELEASE TRIGGER FINGER/A-1 PULLEY LEFT MIDDLE, RING AND SMALL FINGERS (Left)  Patient Location: PACU  Anesthesia Type:MAC and Bier block  Level of Consciousness: awake  Airway and Oxygen Therapy: Patient Spontanous Breathing  Post-op Pain: mild  Post-op Assessment: Post-op Vital signs reviewed, Patient's Cardiovascular Status Stable, Respiratory Function Stable, Patent Airway, No signs of Nausea or vomiting and Pain level controlled  Post-op Vital Signs: stable  Complications: No apparent anesthesia complications

## 2013-03-17 NOTE — H&P (Signed)
Heather [name] is 79 yo   complaining of catching of her right ring middle ans small fingers. This has been going on for 8 months. She has a hictory of gout. She has recently placed a splint for comfort. She complains of pain at the MCP joint. She has had multiple digits in the past without relief. She complains of constant pain. She is not complaining of any numbness or tingling. She was given a trial of Prednisone which she could not tolerate.   Past Medical History:  She is allergic to Codeine and aspirin. She is on Namenda, Metoprolol, Losartan, Tramadol, Cyclobenzaprine and Amlodipine. She has had a suspensionplasty done, multiple trigger finger releases.  Family Medical History: Positive for heart disease and high BP.  Social History: she does not smoke or drink. She is not working at the present time.  Review of Systems: Positive for glasses, otherwise negative for 14 points. Heather [name] is an 79 y.o. female.   Chief Complaint: sts rt middle ring and small fingers HPI: see above  Past Medical History  Diagnosis Date  . Hyperlipidemia   . Chronic LBP   . Gout 01/2012    right index and middle fingers  . Dysrhythmia     atrial fib  . Seizures 2009    seizure x 1; none since pacemaker insertion  . Dementia   . Scoliosis   . Hypertension     under control  . Full dentures   . Wears glasses   . Pacemaker     dr croitoru-[facility]  . Arthritis   . GERD (gastroesophageal reflux disease)     Past Surgical History  Procedure Laterality Date  . Tonsillectomy    . Hernia repair    . Trigger finger release  04/24/2011    release A-1 pulley left index  . Carpal tunnel release  11/14/2009    right  . Appendectomy    . Abdominal hysterectomy    . Heel spur surgery      both feet  . Eye surgery      both cataracts  . Insert / replace / remove pacemaker  2009  . Shoulder arthroscopy  04/15/2006 - left    right:  10/18/2005, 09/24/2005  . Lumbar laminectomy/decompression  microdiscectomy  02/06/2005; 10/21/2003    with fusions   . Back surgery  [id number],[id number]  . Cardioversion  08/08/2010    for atrial fib.  . Trigger finger release  08/10/2003    right thumb  . Wrist arthroscopy  07/04/2010    left wrist; also left CTR  . Spinal growth rods    . Knee arthroscopy      right  . Trigger finger release  01/21/2012    Procedure: RELEASE TRIGGER FINGER/A-1 PULLEY;  Surgeon: Nicki Reaper, MD;  Location: [facility];  Service: Orthopedics;  Laterality: Right;  right ring finger  . I&d extremity  02/05/2012    Procedure: IRRIGATION AND DEBRIDEMENT EXTREMITY;  Surgeon: Nicki Reaper, MD;  Location: [facility];  Service: Orthopedics;  Laterality: Right;  debridement right index and middle fingers    History reviewed. No pertinent family history. Social History:  reports that she has never smoked. She has never used smokeless tobacco. She reports that she does not drink alcohol or use illicit drugs.  Allergies:  Allergies  Allergen Reactions  . Oxycodone Hcl Er Other (See Comments)    HALLUCINATIONS  . Prednisone Other (See Comments)    Hallucinations  .  Aspirin Other (See Comments)    GI UPSET  . Codeine Other (See Comments)    GI UPSET  . Sulfa Antibiotics Other (See Comments)    GI UPSET    Medications Prior to Admission  Medication Sig Dispense Refill  . colchicine 0.6 MG tablet Take 0.6 mg by mouth daily.      . cyclobenzaprine (FLEXERIL) 10 MG tablet Take 10 mg by mouth 3 (three) times daily as needed for muscle spasms.      Marland Kitchen diltiazem (CARDIZEM CD) 240 MG 24 hr capsule Take 240 mg by mouth daily. PM      . esomeprazole (NEXIUM) 40 MG capsule Take 1 capsule (40 mg total) by mouth daily before breakfast.  30 capsule  11  . febuxostat (ULORIC) 40 MG tablet Take 80 mg by mouth daily.      . furosemide (LASIX) 40 MG tablet TAKE 1 TABLET BY MOUTH DAILY AS NEEDED FOR LEG SWELLING  30 tablet  3  . losartan (COZAAR) 50 MG tablet Take 100 mg  by mouth daily. AM      . phytonadione (VITAMIN K) 5 MG tablet Take 1 tablet (5 mg total) by mouth once.  1 tablet  0  . potassium chloride SA (K-DUR,KLOR-CON) 20 MEQ tablet Take 20 mEq by mouth as needed.      . warfarin (COUMADIN) 1 MG tablet 1 mg as directed. 2 mg qd except 3 mg Tues/Thurs      . fish oil-omega-3 fatty acids 1000 MG capsule Take 2 g by mouth daily.      . hydrochlorothiazide (MICROZIDE) 12.5 MG capsule Take 12.5 mg by mouth daily.      . memantine (NAMENDA) 10 MG tablet Take 10 mg by mouth 2 (two) times daily.       . sotalol (BETAPACE) 80 MG tablet Take 80 mg by mouth 2 (two) times daily.         Results for orders placed during the hospital encounter of 03/17/13 (from the past 48 hour(s))  BASIC METABOLIC PANEL     Status: Abnormal   Collection Time    03/16/13 12:00 PM      Result Value Range   Sodium 142  135 - 145 mEq/L   Potassium 4.3  3.5 - 5.1 mEq/L   Chloride 107  96 - 112 mEq/L   CO2 26  19 - 32 mEq/L   Glucose, Bld 90  70 - 99 mg/dL   BUN 22  6 - 23 mg/dL   Creatinine, Ser 1.61 (*) 0.50 - 1.10 mg/dL   Calcium 9.4  8.4 - 09.6 mg/dL   GFR calc non Af Amer 38 (*) >90 mL/min   GFR calc Af Amer 44 (*) >90 mL/min   Comment:            The eGFR has been calculated     using the CKD EPI equation.     This calculation has not been     validated in all clinical     situations.     eGFR's persistently     <90 mL/min signify     possible Chronic Kidney Disease.  APTT     Status: None   Collection Time    03/16/13 12:00 PM      Result Value Range   aPTT 32  24 - 37 seconds  PROTIME-INR     Status: Abnormal   Collection Time    03/16/13 12:00 PM  Result Value Range   Prothrombin Time 18.0 (*) 11.6 - 15.2 seconds   INR 1.54 (*) 0.00 - 1.49    No results found.   Pertinent items are noted in HPI.  Blood pressure 167/89, pulse 75, temperature 98 F (36.7 C), temperature source Oral, resp. rate 18, height 5\' 4"  (1.626 m), weight 62.596 kg (138  lb), SpO2 100.00%.  General appearance: alert, cooperative and appears stated age Head: Normocephalic, without obvious abnormality Neck: no JVD Resp: clear to auscultation bilaterally Cardio: regular rate and rhythm, S1, S2 normal, no murmur, click, rub or gallop GI: normal sounds no tenderness Extremities: extremities normal, atraumatic, no cyanosis or edema Pulses: 2+ and symmetric Skin: Skin color, texture, turgor normal. No rashes or lesions Neurologic: Grossly normal Incision/Wound: na  Assessment/Plan sts middle ring and small rt Plan release triggers  Milagros Middendorf R 03/17/2013, 9:12 AM

## 2013-03-17 NOTE — Anesthesia Preprocedure Evaluation (Signed)
Anesthesia Evaluation  Patient identified by MRN, date of birth, ID band Patient awake    Reviewed: Allergy & Precautions, H&P , NPO status , Patient's Chart, lab work & pertinent test results, reviewed documented beta blocker date and time   Airway Mallampati: II TM Distance: >3 FB Neck ROM: full    Dental   Pulmonary neg pulmonary ROS,  breath sounds clear to auscultation        Cardiovascular hypertension, On Medications and On Home Beta Blockers + dysrhythmias Atrial Fibrillation + Valvular Problems/Murmurs Rhythm:Irregular Rate:Normal     Neuro/Psych Seizures -,  PSYCHIATRIC DISORDERS dementia    GI/Hepatic negative GI ROS, Neg liver ROS, GERD-  ,  Endo/Other  negative endocrine ROS  Renal/GU negative Renal ROS  negative genitourinary   Musculoskeletal   Abdominal   Peds  Hematology negative hematology ROS (+)   Anesthesia Other Findings See surgeon's H&P   Reproductive/Obstetrics negative OB ROS                           Anesthesia Physical Anesthesia Plan  ASA: III  Anesthesia Plan: MAC and Bier Block   Post-op Pain Management:    Induction: Intravenous  Airway Management Planned: Simple Face Mask  Additional Equipment:   Intra-op Plan:   Post-operative Plan:   Informed Consent: I have reviewed the patients History and Physical, chart, labs and discussed the procedure including the risks, benefits and alternatives for the proposed anesthesia with the patient or authorized representative who has indicated his/her understanding and acceptance.     Plan Discussed with: CRNA and Surgeon  Anesthesia Plan Comments:         Anesthesia Quick Evaluation

## 2013-03-18 ENCOUNTER — Encounter (HOSPITAL_BASED_OUTPATIENT_CLINIC_OR_DEPARTMENT_OTHER): Payer: Self-pay | Admitting: Orthopedic Surgery

## 2013-03-18 NOTE — Op Note (Signed)
NAMEGRACEN, Heather [name]:  [id number]  MEDICAL RECORD NO.:  [id number]  LOCATION:                                 FACILITY:  PHYSICIAN:  Cindee Salt, M.D.       DATE OF BIRTH:  [birth year]  DATE OF PROCEDURE:  03/17/2013 DATE OF DISCHARGE:                              OPERATIVE REPORT   PREOPERATIVE DIAGNOSIS:  Trigger finger, left middle ring and small.  POSTOPERATIVE DIAGNOSIS:  Trigger finger, left middle ring and small.  OPERATION:  Release of A1 pulleys left middle, ring and small fingers.  SURGEON:  Cindee Salt, M.D.  ANESTHESIA:  Forearm-based IV regional with local infiltration.  ASSISTANT:  None.  ANESTHESIOLOGIST:  Bedelia Person, M.D.  HISTORY:  The patient is a 79 year old female with a history of gout. She has had triggering of her middle ring and little fingers, not responsive to conservative treatment, has elected to undergo surgical release of each digit.  Pre, peri, and postoperative course have been discussed along with risks and complications.  She is aware that there is no guarantee with the surgery; possibility of infection; recurrence of injury to arteries, nerves, tendons; incomplete relief of symptoms and dystrophy.  In the preoperative area, the patient is seen, the extremity marked by both patient and surgeon, and antibiotic given.  PROCEDURE:  The patient was brought to the operating room where a forearm-based IV regional anesthetic was carried out without difficulty. She was prepped using ChloraPrep, supine position with the left arm free.  A 3-minute dry time was allowed.  Time-out taken, confirming the patient and procedure.  An oblique incision was made over the A1 pulley of the middle finger, carried down through the subcutaneous tissue. Bleeders were electrocauterized with bipolar.  The A1 pulley was identified.  This was released on its radial aspect.  Moderate tenosynovitis was present proximally, this was excised.  The  finger placed through a full range motion, no further triggering was noted.  A separate incision was made obliquely over the ring finger A1 pulley, carried down through the subcutaneous tissue.  Bleeders were again electrocauterized with bipolar meticulously, in that, she was coumadinized.  The A1 pulley was identified.  This was released on its radial aspect.  This was found to be markedly thick.  A flexor sheath cyst was present on the radial aspect.  This was excised.  A small incision was made centrally in A2.  Partial tenosynovectomy was performed proximally.  The finger placed through a full range motion, no further triggering was noted.  An oblique incision was then made over the A1 pulley of the small finger, left hand, carried down through the subcutaneous tissue.  Bleeders were again meticulously cauterized with bipolar.  The A1 pulley identified.  A release performed on its radial aspect, small incision was made centrally in A2.  A partial tenosynovectomy was performed proximally, again no further triggering was noted.  Each of the wounds were then copiously irrigated with saline, sprayed with thrombin spray, and closed with interrupted 4-0 Vicryl Rapide sutures.  Local infiltration with 0.25% Marcaine without epinephrine was then given, approximately 6 mL was used.  Sterile compressive  dressing was applied to the fingers.  These were pinked on deflation of the tourniquet, and she was taken to the recovery room for observation in satisfactory condition.  She will be discharged to home to return in 1 week, on [place].          ______________________________ Cindee Salt, M.D.     GK/MEDQ  D:  03/17/2013  T:  03/17/2013  Job:  [id number]

## 2013-04-15 ENCOUNTER — Encounter: Payer: Self-pay | Admitting: Family Medicine

## 2013-04-15 ENCOUNTER — Ambulatory Visit (INDEPENDENT_AMBULATORY_CARE_PROVIDER_SITE_OTHER): Payer: Medicare Other | Admitting: Family Medicine

## 2013-04-15 VITALS — BP 110/72 | HR 64 | Temp 98.3°F | Resp 14

## 2013-04-15 DIAGNOSIS — I4891 Unspecified atrial fibrillation: Secondary | ICD-10-CM

## 2013-04-15 DIAGNOSIS — I4819 Other persistent atrial fibrillation: Secondary | ICD-10-CM | POA: Insufficient documentation

## 2013-04-15 NOTE — Progress Notes (Signed)
Subjective:    Patient ID: Heather [name], female    DOB: 1933/11/08, 79 y.o.   MRN: [id number]  HPI 03/15/13 Patient has a history of atrial fibrillation. She is currently anticoagulated with Coumadin. She takes 2 mg by mouth daily except on Tuesdays and Thursdays. On Tuesdays and Thursdays she takes 3 mg a day.  She is here for 9 on check. Her INR is 2.2 and therapeutic. However she is planning on having surgery on her hand in less than 48 hours.  She has taken her Coumadin today.  They wonder if there is anything we can do to reverse the coumadin so that she does not have to cancel her surgery.   Assessment & Plan:  Problem #1 atrial fibrillation-  she is therapeutic on her Coumadin. However we will give her vitamin K 5 mg by mouth x1. She is instructed not to take her Coumadin tomorrow. Hopefully that will reverse her Coumadin and so that she can proceed with surgery. I did recommend a check and I are prior surgery. We can do that here or at the surgical Center. The patient's husband states he will contact the surgeon and let [name] decide  After the surgery she is to resume her Coumadin his present doses is adequately thinning her blood.  04/15/13 INR is 4.0 today.  Past Medical History  Diagnosis Date  . Hyperlipidemia   . Chronic LBP   . Gout 01/2012    right index and middle fingers  . Dysrhythmia     atrial fib  . Seizures 2009    seizure x 1; none since pacemaker insertion  . Dementia   . Scoliosis   . Hypertension     under control  . Full dentures   . Wears glasses   . Pacemaker     dr croitoru-[facility]  . Arthritis   . GERD (gastroesophageal reflux disease)   . Atrial fibrillation    Current Outpatient Prescriptions on File Prior to Visit  Medication Sig Dispense Refill  . colchicine 0.6 MG tablet Take 0.6 mg by mouth daily.      . cyclobenzaprine (FLEXERIL) 10 MG tablet Take 10 mg by mouth 3 (three) times daily as needed for muscle spasms.      Marland Kitchen diltiazem (CARDIZEM CD) 240  MG 24 hr capsule Take 240 mg by mouth daily. PM      . esomeprazole (NEXIUM) 40 MG capsule Take 1 capsule (40 mg total) by mouth daily before breakfast.  30 capsule  11  . febuxostat (ULORIC) 40 MG tablet Take 80 mg by mouth daily.      . fish oil-omega-3 fatty acids 1000 MG capsule Take 2 g by mouth daily.      . furosemide (LASIX) 40 MG tablet TAKE 1 TABLET BY MOUTH DAILY AS NEEDED FOR LEG SWELLING  30 tablet  3  . hydrochlorothiazide (MICROZIDE) 12.5 MG capsule Take 12.5 mg by mouth daily.      Marland Kitchen losartan (COZAAR) 50 MG tablet Take 100 mg by mouth daily. AM      . memantine (NAMENDA) 10 MG tablet Take 10 mg by mouth 2 (two) times daily.       . pentazocine-naloxone (TALWIN NX) 50-0.5 MG per tablet Take 1 tablet by mouth every 4 (four) hours as needed for pain.  30 tablet  0  . phytonadione (VITAMIN K) 5 MG tablet Take 1 tablet (5 mg total) by mouth once.  1 tablet  0  . potassium chloride  SA (K-DUR,KLOR-CON) 20 MEQ tablet Take 20 mEq by mouth as needed.      . sotalol (BETAPACE) 80 MG tablet Take 80 mg by mouth 2 (two) times daily.       Marland Kitchen warfarin (COUMADIN) 1 MG tablet 1 mg as directed. 2 mg qd except 3 mg Tues/Friday       No current facility-administered medications on file prior to visit.   History   Social History  . Marital Status: Married    Spouse Name: N/A    Number of Children: N/A  . Years of Education: N/A   Occupational History  . Not on file.   Social History Main Topics  . Smoking status: Never Smoker   . Smokeless tobacco: Never Used  . Alcohol Use: No  . Drug Use: No  . Sexually Active: Not on file   Other Topics Concern  . Not on file   Social History Narrative  . No narrative on file      Review of Systems      Objective:   Physical Exam  Cardiovascular: An irregularly irregular rhythm present.  Pulmonary/Chest: Effort normal and breath sounds normal.  Abdominal: Soft. Bowel sounds are normal. There is no tenderness.          Assessment  & Plan:  Problem #1 atrial fibrillation-  she is supertherapeutic on her Coumadin.  No coumadin tomorrow.  Then 2 mg poqday for 2 weeks.  Recheck in 2 weeks.

## 2013-04-29 ENCOUNTER — Ambulatory Visit (INDEPENDENT_AMBULATORY_CARE_PROVIDER_SITE_OTHER): Payer: Medicare Other | Admitting: Family Medicine

## 2013-04-29 ENCOUNTER — Encounter: Payer: Self-pay | Admitting: Family Medicine

## 2013-04-29 VITALS — BP 110/62 | HR 68 | Temp 98.6°F | Resp 16

## 2013-04-29 DIAGNOSIS — I4891 Unspecified atrial fibrillation: Secondary | ICD-10-CM

## 2013-04-29 LAB — PT WITH INR/FINGERSTICK: INR, fingerstick: 2.4 — ABNORMAL HIGH (ref 0.80–1.20)

## 2013-04-29 NOTE — Progress Notes (Signed)
Subjective:    Patient ID: Heather [name], female    DOB: 10-25-1933, 79 y.o.   MRN: [id number]  HPI 03/15/13 Patient has a history of atrial fibrillation. She is currently anticoagulated with Coumadin. She takes 2 mg by mouth daily except on Tuesdays and Thursdays. On Tuesdays and Thursdays she takes 3 mg a day.  She is here for 9 on check. Her INR is 2.2 and therapeutic. However she is planning on having surgery on her hand in less than 48 hours.  She has taken her Coumadin today.  They wonder if there is anything we can do to reverse the coumadin so that she does not have to cancel her surgery.   Assessment & Plan:  Problem #1 atrial fibrillation-  she is therapeutic on her Coumadin. However we will give her vitamin K 5 mg by mouth x1. She is instructed not to take her Coumadin tomorrow. Hopefully that will reverse her Coumadin and so that she can proceed with surgery. I did recommend a check and I are prior surgery. We can do that here or at the surgical Center. The patient's husband states he will contact the surgeon and let [name] decide  After the surgery she is to resume her Coumadin his present doses is adequately thinning her blood.  04/15/13 INR is 4.0 today. Assessment & Plan:  Problem #1 atrial fibrillation-  she is supertherapeutic on her Coumadin.  No coumadin tomorrow.  Then 2 mg poqday for 2 weeks.  Recheck in 2 weeks.  04/29/13 INR today is therapeutic and she is asymptomatic.  Past Medical History  Diagnosis Date  . Hyperlipidemia   . Chronic LBP   . Gout 01/2012    right index and middle fingers  . Dysrhythmia     atrial fib  . Seizures 2009    seizure x 1; none since pacemaker insertion  . Dementia   . Scoliosis   . Hypertension     under control  . Full dentures   . Wears glasses   . Pacemaker     dr croitoru-[facility]  . Arthritis   . GERD (gastroesophageal reflux disease)   . Atrial fibrillation    Current Outpatient Prescriptions on File Prior to Visit   Medication Sig Dispense Refill  . colchicine 0.6 MG tablet Take 0.6 mg by mouth daily.      . cyclobenzaprine (FLEXERIL) 10 MG tablet Take 10 mg by mouth 3 (three) times daily as needed for muscle spasms.      Marland Kitchen diltiazem (CARDIZEM CD) 240 MG 24 hr capsule Take 240 mg by mouth daily. PM      . esomeprazole (NEXIUM) 40 MG capsule Take 1 capsule (40 mg total) by mouth daily before breakfast.  30 capsule  11  . febuxostat (ULORIC) 40 MG tablet Take 80 mg by mouth daily.      . fish oil-omega-3 fatty acids 1000 MG capsule Take 2 g by mouth daily.      . furosemide (LASIX) 40 MG tablet TAKE 1 TABLET BY MOUTH DAILY AS NEEDED FOR LEG SWELLING  30 tablet  3  . hydrochlorothiazide (MICROZIDE) 12.5 MG capsule Take 12.5 mg by mouth daily.      Marland Kitchen losartan (COZAAR) 50 MG tablet Take 100 mg by mouth daily. AM      . memantine (NAMENDA) 10 MG tablet Take 10 mg by mouth 2 (two) times daily.       . pentazocine-naloxone (TALWIN NX) 50-0.5 MG per tablet Take 1  tablet by mouth every 4 (four) hours as needed for pain.  30 tablet  0  . phytonadione (VITAMIN K) 5 MG tablet Take 1 tablet (5 mg total) by mouth once.  1 tablet  0  . potassium chloride SA (K-DUR,KLOR-CON) 20 MEQ tablet Take 20 mEq by mouth as needed.      . sotalol (BETAPACE) 80 MG tablet Take 80 mg by mouth 2 (two) times daily.       Marland Kitchen warfarin (COUMADIN) 1 MG tablet 1 mg as directed. 2 mg qd       No current facility-administered medications on file prior to visit.   History   Social History  . Marital Status: Married    Spouse Name: N/A    Number of Children: N/A  . Years of Education: N/A   Occupational History  . Not on file.   Social History Main Topics  . Smoking status: Never Smoker   . Smokeless tobacco: Never Used  . Alcohol Use: No  . Drug Use: No  . Sexually Active: Not on file   Other Topics Concern  . Not on file   Social History Narrative  . No narrative on file      Review of Systems      Objective:    Physical Exam  Cardiovascular: An irregularly irregular rhythm present.  Pulmonary/Chest: Effort normal and breath sounds normal.  Abdominal: Soft. Bowel sounds are normal. There is no tenderness.          Assessment & Plan:  Problem #1 atrial fibrillation-  Continue Coumadin 2 mg by mouth daily. Recheck INR in 4 weeks.

## 2013-05-17 ENCOUNTER — Other Ambulatory Visit: Payer: Self-pay | Admitting: *Deleted

## 2013-05-17 MED ORDER — DILTIAZEM HCL ER COATED BEADS 240 MG PO CP24: 240.0000 mg | ORAL_CAPSULE | Freq: Every day | ORAL | Status: DC

## 2013-05-18 ENCOUNTER — Other Ambulatory Visit: Payer: Self-pay | Admitting: *Deleted

## 2013-05-24 ENCOUNTER — Other Ambulatory Visit: Payer: Self-pay | Admitting: Family Medicine

## 2013-06-07 ENCOUNTER — Encounter: Payer: Medicare Other | Admitting: Family Medicine

## 2013-06-07 NOTE — Progress Notes (Signed)
Subjective:    Patient ID: Heather [name], female    DOB: [birth year], 79 y.o.   MRN: [id number]  HPI 03/15/13 Patient has a history of atrial fibrillation. She is currently anticoagulated with Coumadin. She takes 2 mg by mouth daily except on Tuesdays and Thursdays. On Tuesdays and Thursdays she takes 3 mg a day.  She is here for 9 on check. Her INR is 2.2 and therapeutic. However she is planning on having surgery on her hand in less than 48 hours.  She has taken her Coumadin today.  They wonder if there is anything we can do to reverse the coumadin so that she does not have to cancel her surgery.   Assessment & Plan:  Problem #1 atrial fibrillation-  she is therapeutic on her Coumadin. However we will give her vitamin K 5 mg by mouth x1. She is instructed not to take her Coumadin tomorrow. Hopefully that will reverse her Coumadin and so that she can proceed with surgery. I did recommend a check and I are prior surgery. We can do that here or at the surgical Center. The patient's husband states he will contact the surgeon and let [name] decide  After the surgery she is to resume her Coumadin his present doses is adequately thinning her blood.  04/15/13 INR is 4.0 today. Assessment & Plan:  Problem #1 atrial fibrillation-  she is supertherapeutic on her Coumadin.  No coumadin tomorrow.  Then 2 mg poqday for 2 weeks.  Recheck in 2 weeks.  04/29/13 INR today is therapeutic and she is asymptomatic.  At that time, my plan was: Assessment & Plan:  Problem #1 atrial fibrillation-  Continue Coumadin 2 mg by mouth daily. Recheck INR in 4 weeks.  06/07/13 Patient returns today taking coumadin 2 mg poqday.  She is otherwise doing very well without any major concerns.  Her INR today in clinic is   Past Medical History  Diagnosis Date  . Hyperlipidemia   . Chronic LBP   . Gout 01/2012    right index and middle fingers  . Dysrhythmia     atrial fib  . Seizures 2009    seizure x 1; none since pacemaker  insertion  . Dementia   . Scoliosis   . Hypertension     under control  . Full dentures   . Wears glasses   . Pacemaker     dr croitoru-[facility]  . Arthritis   . GERD (gastroesophageal reflux disease)   . Atrial fibrillation    Current Outpatient Prescriptions on File Prior to Visit  Medication Sig Dispense Refill  . colchicine 0.6 MG tablet Take 0.6 mg by mouth daily.      . cyclobenzaprine (FLEXERIL) 10 MG tablet Take 10 mg by mouth 3 (three) times daily as needed for muscle spasms.      Marland Kitchen diltiazem (CARDIZEM CD) 240 MG 24 hr capsule Take 1 capsule (240 mg total) by mouth daily. PM  30 capsule  4  . esomeprazole (NEXIUM) 40 MG capsule Take 1 capsule (40 mg total) by mouth daily before breakfast.  30 capsule  11  . febuxostat (ULORIC) 40 MG tablet Take 80 mg by mouth daily.      . fish oil-omega-3 fatty acids 1000 MG capsule Take 2 g by mouth daily.      . furosemide (LASIX) 40 MG tablet TAKE 1 TABLET BY MOUTH DAILY AS NEEDED FOR LEG SWELLING  30 tablet  3  . hydrochlorothiazide (MICROZIDE) 12.5 MG  capsule Take 12.5 mg by mouth daily.      Marland Kitchen losartan (COZAAR) 50 MG tablet Take 100 mg by mouth daily. AM      . memantine (NAMENDA) 10 MG tablet Take 10 mg by mouth 2 (two) times daily.       . pentazocine-naloxone (TALWIN NX) 50-0.5 MG per tablet Take 1 tablet by mouth every 4 (four) hours as needed for pain.  30 tablet  0  . phytonadione (VITAMIN K) 5 MG tablet Take 1 tablet (5 mg total) by mouth once.  1 tablet  0  . potassium chloride SA (K-DUR,KLOR-CON) 20 MEQ tablet Take 20 mEq by mouth as needed.      . sotalol (BETAPACE) 80 MG tablet Take 80 mg by mouth 2 (two) times daily.       Marland Kitchen warfarin (COUMADIN) 1 MG tablet TAKE 3 TABLETS EVERY DAY OR AS DIRECTED  90 tablet  5   No current facility-administered medications on file prior to visit.   History   Social History  . Marital Status: Married    Spouse Name: N/A    Number of Children: N/A  . Years of Education: N/A   Occupational  History  . Not on file.   Social History Main Topics  . Smoking status: Never Smoker   . Smokeless tobacco: Never Used  . Alcohol Use: No  . Drug Use: No  . Sexually Active: Not on file   Other Topics Concern  . Not on file   Social History Narrative  . No narrative on file      Review of Systems      Objective:   Physical Exam  Cardiovascular: An irregularly irregular rhythm present.  Pulmonary/Chest: Effort normal and breath sounds normal.  Abdominal: Soft. Bowel sounds are normal. There is no tenderness.          Assessment & Plan:  Problem #1 atrial fibrillation-  Continue Coumadin 2 mg by mouth daily. Recheck INR in 4 weeks. This encounter was created in error - please disregard.

## 2013-06-14 ENCOUNTER — Encounter: Payer: Self-pay | Admitting: Family Medicine

## 2013-06-14 ENCOUNTER — Ambulatory Visit (INDEPENDENT_AMBULATORY_CARE_PROVIDER_SITE_OTHER): Payer: Medicare Other | Admitting: Family Medicine

## 2013-06-14 VITALS — BP 100/60 | HR 68 | Temp 97.9°F | Resp 18

## 2013-06-14 DIAGNOSIS — I4891 Unspecified atrial fibrillation: Secondary | ICD-10-CM

## 2013-06-14 NOTE — Progress Notes (Signed)
Subjective:    Patient ID: Heather [name], female    DOB: 1933/08/28, 79 y.o.   MRN: [id number]  HPI 03/15/13 Patient has a history of atrial fibrillation. She is currently anticoagulated with Coumadin. She takes 2 mg by mouth daily except on Tuesdays and Thursdays. On Tuesdays and Thursdays she takes 3 mg a day.  She is here for 9 on check. Her INR is 2.2 and therapeutic. However she is planning on having surgery on her hand in less than 48 hours.  She has taken her Coumadin today.  They wonder if there is anything we can do to reverse the coumadin so that she does not have to cancel her surgery.   Assessment & Plan:  Problem #1 atrial fibrillation-  she is therapeutic on her Coumadin. However we will give her vitamin K 5 mg by mouth x1. She is instructed not to take her Coumadin tomorrow. Hopefully that will reverse her Coumadin and so that she can proceed with surgery. I did recommend a check and I are prior surgery. We can do that here or at the surgical Center. The patient's husband states he will contact the surgeon and let [name] decide  After the surgery she is to resume her Coumadin his present doses is adequately thinning her blood.  04/15/13 INR is 4.0 today. Assessment & Plan:  Problem #1 atrial fibrillation-  she is supertherapeutic on her Coumadin.  No coumadin tomorrow.  Then 2 mg poqday for 2 weeks.  Recheck in 2 weeks.  04/29/13 INR today is therapeutic and she is asymptomatic.  At that time, the plan was: Problem #1 atrial fibrillation-  Continue Coumadin 2 mg by mouth daily. Recheck INR in 4 weeks.  06/14/13 Patient is here today to recheck her INR for atrial fibrillation.  She is currently on Coumadin 2 mg by mouth daily.  Her INR today in clinic is 3.1. She has been eating less vitamin K.  Past Medical History  Diagnosis Date  . Hyperlipidemia   . Chronic LBP   . Gout 01/2012    right index and middle fingers  . Dysrhythmia     atrial fib  . Seizures 2009    seizure x 1;  none since pacemaker insertion  . Dementia   . Scoliosis   . Hypertension     under control  . Full dentures   . Wears glasses   . Pacemaker     dr croitoru-[facility]  . Arthritis   . GERD (gastroesophageal reflux disease)   . Atrial fibrillation    Current Outpatient Prescriptions on File Prior to Visit  Medication Sig Dispense Refill  . colchicine 0.6 MG tablet Take 0.6 mg by mouth daily.      . cyclobenzaprine (FLEXERIL) 10 MG tablet Take 10 mg by mouth 3 (three) times daily as needed for muscle spasms.      Marland Kitchen diltiazem (CARDIZEM CD) 240 MG 24 hr capsule Take 1 capsule (240 mg total) by mouth daily. PM  30 capsule  4  . esomeprazole (NEXIUM) 40 MG capsule Take 1 capsule (40 mg total) by mouth daily before breakfast.  30 capsule  11  . febuxostat (ULORIC) 40 MG tablet Take 80 mg by mouth daily.      . fish oil-omega-3 fatty acids 1000 MG capsule Take 2 g by mouth daily.      . furosemide (LASIX) 40 MG tablet TAKE 1 TABLET BY MOUTH DAILY AS NEEDED FOR LEG SWELLING  30 tablet  3  .  hydrochlorothiazide (MICROZIDE) 12.5 MG capsule Take 12.5 mg by mouth daily.      Marland Kitchen losartan (COZAAR) 50 MG tablet Take 100 mg by mouth daily. AM      . memantine (NAMENDA) 10 MG tablet Take 10 mg by mouth 2 (two) times daily.       . pentazocine-naloxone (TALWIN NX) 50-0.5 MG per tablet Take 1 tablet by mouth every 4 (four) hours as needed for pain.  30 tablet  0  . phytonadione (VITAMIN K) 5 MG tablet Take 1 tablet (5 mg total) by mouth once.  1 tablet  0  . potassium chloride SA (K-DUR,KLOR-CON) 20 MEQ tablet Take 20 mEq by mouth as needed.      . sotalol (BETAPACE) 80 MG tablet Take 80 mg by mouth 2 (two) times daily.       Marland Kitchen warfarin (COUMADIN) 1 MG tablet TAKE 3 TABLETS EVERY DAY OR AS DIRECTED  90 tablet  5   No current facility-administered medications on file prior to visit.   History   Social History  . Marital Status: Married    Spouse Name: N/A    Number of Children: N/A  . Years of Education:  N/A   Occupational History  . Not on file.   Social History Main Topics  . Smoking status: Never Smoker   . Smokeless tobacco: Never Used  . Alcohol Use: No  . Drug Use: No  . Sexually Active: Not on file   Other Topics Concern  . Not on file   Social History Narrative  . No narrative on file      Review of Systems      Objective:   Physical Exam  Cardiovascular: An irregularly irregular rhythm present.  Pulmonary/Chest: Effort normal and breath sounds normal.  Abdominal: Soft. Bowel sounds are normal. There is no tenderness.          Assessment & Plan:  1. A-fib No Coumadin today, then resume regular Coumadin dose 2 mg by mouth daily recheck in 4 weeks. Try to increase vitamin K in diet slightly. - PT with INR/Fingerstick

## 2013-06-30 ENCOUNTER — Encounter: Payer: Self-pay | Admitting: *Deleted

## 2013-07-01 ENCOUNTER — Other Ambulatory Visit: Payer: Self-pay | Admitting: Family Medicine

## 2013-07-01 NOTE — Telephone Encounter (Signed)
Medication refilled per protocol. 

## 2013-07-08 ENCOUNTER — Other Ambulatory Visit: Payer: Self-pay | Admitting: Family Medicine

## 2013-07-08 NOTE — Telephone Encounter (Signed)
Refill completed.

## 2013-07-08 NOTE — Telephone Encounter (Signed)
ok 

## 2013-07-08 NOTE — Telephone Encounter (Signed)
?   OK to Refill  

## 2013-07-15 ENCOUNTER — Encounter: Payer: Self-pay | Admitting: Family Medicine

## 2013-07-15 ENCOUNTER — Ambulatory Visit (INDEPENDENT_AMBULATORY_CARE_PROVIDER_SITE_OTHER): Payer: Medicare Other | Admitting: Family Medicine

## 2013-07-15 VITALS — BP 120/60 | HR 62 | Temp 98.2°F | Resp 16 | Wt 131.0 lb

## 2013-07-15 DIAGNOSIS — I4891 Unspecified atrial fibrillation: Secondary | ICD-10-CM

## 2013-07-15 LAB — PT WITH INR/FINGERSTICK
INR, fingerstick: 3.1 — ABNORMAL HIGH (ref 0.80–1.20)
PT, fingerstick: 36.7 seconds — ABNORMAL HIGH (ref 10.4–12.5)

## 2013-07-15 NOTE — Patient Instructions (Addendum)
On Monday and Friday, take 1 mg a day.  Every other day take 2 mg like you have been.  Recheck in 1 month

## 2013-07-15 NOTE — Progress Notes (Signed)
Subjective:    Patient ID: Heather [name], female    DOB: [birth year], 79 y.o.   MRN: [id number]  HPI  Patient is here today for her atrial fibrillation. She is here today for a Coumadin check. She denies any chest pain, shortness of breath, or palpitations. She denies any syncope or presyncope. Her INR today is 3.1. This is the second visit in a row her INR has been slightly supertherapeutic.  She is taking Coumadin 2 mg by mouth daily.  Past Medical History  Diagnosis Date  . Hyperlipidemia   . Chronic LBP   . Gout 01/2012    right index and middle fingers  . Dysrhythmia     atrial fib  . Seizures 2009    seizure x 1; none since pacemaker insertion  . Dementia   . Scoliosis   . Hypertension     under control  . Full dentures   . Wears glasses   . Pacemaker     dr croitoru[facility]  . Arthritis   . GERD (gastroesophageal reflux disease)   . Atrial fibrillation    Past Surgical History  Procedure Laterality Date  . Tonsillectomy    . Hernia repair    . Trigger finger release  04/24/2011    release A-1 pulley left index  . Carpal tunnel release  11/14/2009    right  . Appendectomy    . Abdominal hysterectomy    . Heel spur surgery      both feet  . Eye surgery      both cataracts  . Insert / replace / remove pacemaker  2009  . Shoulder arthroscopy  04/15/2006 - left    right:  10/18/2005, 09/24/2005  . Lumbar laminectomy/decompression microdiscectomy  02/06/2005; 10/21/2003    with fusions   . Back surgery  4098,1191  . Cardioversion  08/08/2010    for atrial fib.  . Trigger finger release  08/10/2003    right thumb  . Wrist arthroscopy  07/04/2010    left wrist; also left CTR  . Spinal growth rods    . Knee arthroscopy      right  . Trigger finger release  01/21/2012    Procedure: RELEASE TRIGGER FINGER/A-1 PULLEY;  Surgeon: Nicki Reaper, MD;  Location: [facility];  Service: Orthopedics;  Laterality: Right;  right ring finger  . I&d extremity  02/05/2012   Procedure: IRRIGATION AND DEBRIDEMENT EXTREMITY;  Surgeon: Nicki Reaper, MD;  Location: [facility];  Service: Orthopedics;  Laterality: Right;  debridement right index and middle fingers  . Trigger finger release Left 03/17/2013    Procedure: RELEASE TRIGGER FINGER/A-1 PULLEY LEFT MIDDLE, RING AND SMALL FINGERS;  Surgeon: Nicki Reaper, MD;  Location:  SUR[facility]ERY C[facility];  Service: Orthopedics;  Laterality: Left;   Current Outpatient Prescriptions on File Prior to Visit  Medication Sig Dispense Refill  . colchicine 0.6 MG tablet Take 0.6 mg by mouth daily.      . cyclobenzaprine (FLEXERIL) 10 MG tablet TAKE 1 TABLET BY MOUTH EVERY 8 HOURS ** USE ONLY AS NEEDED FOR BACK PAIN**  90 tablet  1  . diltiazem (CARDIZEM CD) 240 MG 24 hr capsule Take 1 capsule (240 mg total) by mouth daily. PM  30 capsule  4  . esomeprazole (NEXIUM) 40 MG capsule Take 1 capsule (40 mg total) by mouth daily before breakfast.  30 capsule  11  . febuxostat (ULORIC) 40 MG tablet Take 80 mg by mouth  daily.      . fish oil-omega-3 fatty acids 1000 MG capsule Take 2 g by mouth daily.      . furosemide (LASIX) 40 MG tablet TAKE 1 TABLET BY MOUTH DAILY AS NEEDED FOR LEG SWELLING  30 tablet  3  . hydrochlorothiazide (MICROZIDE) 12.5 MG capsule Take 12.5 mg by mouth daily.      Marland Kitchen losartan (COZAAR) 50 MG tablet Take 100 mg by mouth daily. AM      . NAMENDA 10 MG tablet TAKE 1 TABLET BY MOUTH TWICE A DAY  180 tablet  3  . pentazocine-naloxone (TALWIN NX) 50-0.5 MG per tablet Take 1 tablet by mouth every 4 (four) hours as needed for pain.  30 tablet  0  . phytonadione (VITAMIN K) 5 MG tablet Take 1 tablet (5 mg total) by mouth once.  1 tablet  0  . potassium chloride SA (K-DUR,KLOR-CON) 20 MEQ tablet Take 20 mEq by mouth as needed.      . sotalol (BETAPACE) 80 MG tablet TAKE 1 TABLET BY MOUTH TWICE A DAY  60 tablet  5   No current facility-administered medications on file prior to visit.   Allergies  Allergen  Reactions  . Oxycodone Hcl Er Other (See Comments)    HALLUCINATIONS  . Prednisone Other (See Comments)    Hallucinations  . Aspirin Other (See Comments)    GI UPSET  . Codeine Other (See Comments)    GI UPSET  . Sulfa Antibiotics Other (See Comments)    GI UPSET   History   Social History  . Marital Status: Married    Spouse Name: N/A    Number of Children: N/A  . Years of Education: N/A   Occupational History  . Not on file.   Social History Main Topics  . Smoking status: Never Smoker   . Smokeless tobacco: Never Used  . Alcohol Use: No  . Drug Use: No  . Sexual Activity: Not on file   Other Topics Concern  . Not on file   Social History Narrative  . No narrative on file     Review of Systems  All other systems reviewed and are negative.       Objective:   Physical Exam  Cardiovascular: Normal rate and normal heart sounds.  Exam reveals no gallop and no friction rub.   No murmur heard. Pulmonary/Chest: Effort normal and breath sounds normal. No respiratory distress. She has no wheezes. She has no rales. She exhibits no tenderness.  Abdominal: Soft. Bowel sounds are normal.  Musculoskeletal: She exhibits edema.   she has an irregularly irregular rhythm. She has trace bipedal edema.         Assessment & Plan:  1. A-fib Decrease Coumadin to 2 mg every day except Monday and Friday. On Monday and Friday take 1 mg. Recheck INR in 4 weeks. - PT with INR/Fingerstick; Standing - PT with INR/Fingerstick

## 2013-08-11 ENCOUNTER — Encounter: Payer: Self-pay | Admitting: Cardiovascular Disease

## 2013-08-11 ENCOUNTER — Ambulatory Visit (INDEPENDENT_AMBULATORY_CARE_PROVIDER_SITE_OTHER): Payer: Medicare Other | Admitting: Cardiovascular Disease

## 2013-08-11 VITALS — BP 116/76 | HR 62 | Ht 61.0 in | Wt 127.0 lb

## 2013-08-11 DIAGNOSIS — I4891 Unspecified atrial fibrillation: Secondary | ICD-10-CM

## 2013-08-11 DIAGNOSIS — I519 Heart disease, unspecified: Secondary | ICD-10-CM

## 2013-08-11 DIAGNOSIS — I5189 Other ill-defined heart diseases: Secondary | ICD-10-CM

## 2013-08-11 DIAGNOSIS — I10 Essential (primary) hypertension: Secondary | ICD-10-CM

## 2013-08-11 DIAGNOSIS — Z950 Presence of cardiac pacemaker: Secondary | ICD-10-CM | POA: Insufficient documentation

## 2013-08-11 DIAGNOSIS — R011 Cardiac murmur, unspecified: Secondary | ICD-10-CM

## 2013-08-11 LAB — PACEMAKER DEVICE OBSERVATION

## 2013-08-11 NOTE — Assessment & Plan Note (Signed)
Good control

## 2013-08-11 NOTE — Assessment & Plan Note (Signed)
Reported by echocardiography, this is not clinically apparent.

## 2013-08-11 NOTE — Patient Instructions (Addendum)
Remote monitoring is used to monitor your Pacemaker of ICD from home. This monitoring reduces the number of office visits required to check your device to one time per year. It allows [place] to keep an eye on the functioning of your device to ensure it is working properly. You are scheduled for a device check from home on 11-11-2013. You may send your transmission at any time that day. If you have a wireless device, the transmission will be sent automatically. After your physician reviews your transmission, you will receive a postcard with your next transmission date.  Your physician recommends that you schedule a follow-up appointment in: 6 months

## 2013-08-11 NOTE — Progress Notes (Signed)
Patient ID: Heather [name], female   DOB: 08-08-1933, 79 y.o.   MRN: [id number]    Reason for office visit Pacemaker, paroxysmal atrial  Heather [name] is here accompanied as always by her husband. As always, he does most of the talking she is very quiet. She has had no new health challenges since her last appointment in general he feels well. She enjoys being active in the yard. She does have early to moderate dementia. She has not had any falls or any bleeding problems. She is on warfarin anticoagulation which is monitored and Dr. Caren Macadam office. She has very frequent episodes of minimally symptomatic atrial fibrillation that has responded in a moderately effective way to sotalol therapy. She does not have major structural cardiac abnormalities, even though her echocardiogram suggests a diastolic dysfunction she has not had problems with congestive heart failure. She does have chronic isolated swelling of her left ankle.    Allergies  Allergen Reactions  . Oxycodone Hcl Er Other (See Comments)    HALLUCINATIONS  . Prednisone Other (See Comments)    Hallucinations  . Aspirin Other (See Comments)    GI UPSET  . Codeine Other (See Comments)    GI UPSET  . Sulfa Antibiotics Other (See Comments)    GI UPSET    Current Outpatient Prescriptions  Medication Sig Dispense Refill  . colchicine 0.6 MG tablet Take 0.6 mg by mouth daily.      . cyclobenzaprine (FLEXERIL) 10 MG tablet TAKE 1 TABLET BY MOUTH EVERY 8 HOURS ** USE ONLY AS NEEDED FOR BACK PAIN**  90 tablet  1  . diltiazem (CARDIZEM CD) 240 MG 24 hr capsule Take 1 capsule (240 mg total) by mouth daily. PM  30 capsule  4  . esomeprazole (NEXIUM) 40 MG capsule Take 1 capsule (40 mg total) by mouth daily before breakfast.  30 capsule  11  . fish oil-omega-3 fatty acids 1000 MG capsule Take 2 g by mouth daily.      . furosemide (LASIX) 40 MG tablet TAKE 1 TABLET BY MOUTH DAILY AS NEEDED FOR LEG SWELLING  30 tablet  3  . losartan (COZAAR) 50  MG tablet Take 100 mg by mouth daily. AM      . NAMENDA 10 MG tablet TAKE 1 TABLET BY MOUTH TWICE A DAY  180 tablet  3  . potassium chloride SA (K-DUR,KLOR-CON) 20 MEQ tablet Take 20 mEq by mouth as needed.      . sotalol (BETAPACE) 80 MG tablet TAKE 1 TABLET BY MOUTH TWICE A DAY  60 tablet  5  . warfarin (COUMADIN) 1 MG tablet        No current facility-administered medications for this visit.    Past Medical History  Diagnosis Date  . Hyperlipidemia   . Chronic LBP   . Gout 01/2012    right index and middle fingers  . Dysrhythmia     atrial fib  . Seizures 2009    seizure x 1; none since pacemaker insertion  . Dementia   . Scoliosis   . Hypertension     under control  . Full dentures   . Wears glasses   . Pacemaker     dr Heather [name]  . Arthritis   . GERD (gastroesophageal reflux disease)   . Atrial fibrillation     Past Surgical History  Procedure Laterality Date  . Tonsillectomy    . Hernia repair    . Trigger finger release  04/24/2011  release A-1 pulley left index  . Carpal tunnel release  11/14/2009    right  . Appendectomy    . Abdominal hysterectomy    . Heel spur surgery      both feet  . Eye surgery      both cataracts  . Insert / replace / remove pacemaker  2009  . Shoulder arthroscopy  04/15/2006 - left    right:  10/18/2005, 09/24/2005  . Lumbar laminectomy/decompression microdiscectomy  02/06/2005; 10/21/2003    with fusions   . Back surgery  8295,6213  . Cardioversion  08/08/2010    for atrial fib.  . Trigger finger release  08/10/2003    right thumb  . Wrist arthroscopy  07/04/2010    left wrist; also left CTR  . Spinal growth rods    . Knee arthroscopy      right  . Trigger finger release  01/21/2012    Procedure: RELEASE TRIGGER FINGER/A-1 PULLEY;  Surgeon: Nicki Reaper, MD;  Location: [facility];  Service: Orthopedics;  Laterality: Right;  right ring finger  . I&d extremity  02/05/2012    Procedure: IRRIGATION AND DEBRIDEMENT  EXTREMITY;  Surgeon: Nicki Reaper, MD;  Location: [facility];  Service: Orthopedics;  Laterality: Right;  debridement right index and middle fingers  . Trigger finger release Left 03/17/2013    Procedure: RELEASE TRIGGER FINGER/A-1 PULLEY LEFT MIDDLE, RING AND SMALL FINGERS;  Surgeon: Nicki Reaper, MD;  Location: [facility];  Service: Orthopedics;  Laterality: Left;    No family history on file.  History   Social History  . Marital Status: Married    Spouse Name: N/A    Number of Children: N/A  . Years of Education: N/A   Occupational History  . Not on file.   Social History Main Topics  . Smoking status: Never Smoker   . Smokeless tobacco: Never Used  . Alcohol Use: No  . Drug Use: No  . Sexual Activity: Not on file   Other Topics Concern  . Not on file   Social History Narrative  . No narrative on file    Review of systems: The patient specifically denies any chest pain at rest or with exertion, dyspnea at rest or with exertion, orthopnea, paroxysmal nocturnal dyspnea, syncope, palpitations, focal neurological deficits, intermittent claudication, unexplained weight gain, cough, hemoptysis or wheezing.  The patient also denies abdominal pain, nausea, vomiting, dysphagia, diarrhea, constipation, polyuria, polydipsia, dysuria, hematuria, frequency, urgency, abnormal bleeding or bruising, fever, chills, unexpected weight changes, mood swings, change in skin or hair texture, change in voice quality, auditory or visual problems, allergic reactions or rashes, new musculoskeletal complaints other than usual "aches and pains".   PHYSICAL EXAM BP 116/76  Pulse 62  Ht 5\' 1"  (1.549 m)  Wt 127 lb (57.607 kg)  BMI 24.01 kg/m2  General: Alert, oriented x3, no distress Head: no evidence of trauma, PERRL, EOMI, no exophtalmos or lid lag, no myxedema, no xanthelasma; normal ears, nose and oropharynx Neck: normal jugular venous pulsations and no  hepatojugular reflux; brisk carotid pulses without delay and no carotid bruits Chest: clear to auscultation, no signs of consolidation by percussion or palpation, normal fremitus, symmetrical and full respiratory excursions; subclavian pacemaker site is healthy Cardiovascular: normal position and quality of the apical impulse, regular rhythm, normal first and second heart sounds, no  rubs or gallops, grade 1/6 early peaking systolic ejection murmur in the aortic focus Abdomen: no tenderness or  distention, no masses by palpation, no abnormal pulsatility or arterial bruits, normal bowel sounds, no hepatosplenomegaly Extremities: no clubbing, cyanosis or edema; 2+ radial, ulnar and brachial pulses bilaterally; 2+ right femoral, posterior tibial and dorsalis pedis pulses; 2+ left femoral, posterior tibial and dorsalis pedis pulses; no subclavian or femoral bruits Neurological: grossly nonfocal   EKG: Atrial paced ventricular sensed rhythm. Prolonged QT that corrects at approx 500 ms while on treatment with sotalol  Lipid Panel     Component Value Date/Time   CHOL  Value: 178        ATP III CLASSIFICATION:  <200     mg/dL   Desirable  629-528  mg/dL   Borderline High  >=413    mg/dL   High        01/05/4009 0214   TRIG 76 08/05/2010 0214   HDL 51 08/05/2010 0214   CHOLHDL 3.5 08/05/2010 0214   VLDL 15 08/05/2010 0214   LDLCALC  Value: 112        Total Cholesterol/HDL:CHD Risk Coronary Heart Disease Risk Table                     Men   Women  1/2 Average Risk   3.4   3.3  Average Risk       5.0   4.4  2 X Average Risk   9.6   7.1  3 X Average Risk  23.4   11.0        Use the calculated Patient Ratio above and the CHD Risk Table to determine the patient's CHD Risk.        ATP III CLASSIFICATION (LDL):  <100     mg/dL   Optimal  272-536  mg/dL   Near or Above                    Optimal  130-159  mg/dL   Borderline  644-034  mg/dL   High  >742     mg/dL   Very High* 04/09/5637 7564    BMET    Component Value  Date/Time   NA 142 03/16/2013 1200   K 4.3 03/16/2013 1200   CL 107 03/16/2013 1200   CO2 26 03/16/2013 1200   GLUCOSE 90 03/16/2013 1200   BUN 22 03/16/2013 1200   CREATININE 1.32* 03/16/2013 1200   CALCIUM 9.4 03/16/2013 1200   GFRNONAA 38* 03/16/2013 1200   GFRAA 44* 03/16/2013 1200     ASSESSMENT AND PLAN Atrial fibrillation, paroxysmal The burden of atrial fibrillation is substantially diminished. The exact cause of the increased frequency of atrial fibrillation last winter is uncertain. Over the last year the overall burden of atrial fibrillation has been 21%. Over the last few weeks it seems to be down to less than 10%. She is on appropriate anticoagulation therapy. Chest not had any bleeding complications. To my knowledge she has no history of stroke, TIA or other embolic events. Continue caroling downloads every 3 months at office visit every 6 months. Needs periodic QT interval reevaluation. Discussed need to be aware of potential drug interactions and other medications that may also prolong the QT interval and the to life-threatening arrhythmia. Also discussed the risk of drug interactions with warfarin. She adjusts her hydrochlorothiazide dose for ankle swelling and needs to be wary of potential hypokalemia. Periodic potassium levels, at least once every 3 months or probably indicated.  Pacemaker dual chamber implanted August 2009 for sinus node dysfunction Medtronic Enrhythm Full  interrogation the clinic today including active testing of pacing thresholds shows normal function. No permanent adjustments with the device settings. Note 77% atrial pacing and roughly 11% ventricular pacing. Estimated device longevity is uncertain. Note that this model is prone to rapid battery depletion. She is not pacemaker dependent  Hypertension Good control  Diastolic dysfunction Reported by echocardiography, this is not clinically apparent.  Heart murmur Probably due to aortic valve  sclerosis   Orders Placed This Encounter  Procedures  . EKG 12-Lead   No orders of the defined types were placed in this encounter.    Junious Silk, MD, [facility] [phone] office [phone] pager

## 2013-08-11 NOTE — Assessment & Plan Note (Addendum)
The burden of atrial fibrillation is substantially diminished. The exact cause of the increased frequency of atrial fibrillation last winter is uncertain. Over the last year the overall burden of atrial fibrillation has been 21%. Over the last few weeks it seems to be down to less than 10%. She is on appropriate anticoagulation therapy. Chest not had any bleeding complications. To my knowledge she has no history of stroke, TIA or other embolic events. Continue caroling downloads every 3 months at office visit every 6 months. Needs periodic QT interval reevaluation. Discussed need to be aware of potential drug interactions and other medications that may also prolong the QT interval and the to life-threatening arrhythmia. Also discussed the risk of drug interactions with warfarin. She adjusts her hydrochlorothiazide dose for ankle swelling and needs to be wary of potential hypokalemia. Periodic potassium levels, at least once every 3 months or probably indicated.

## 2013-08-11 NOTE — Assessment & Plan Note (Signed)
Full interrogation the clinic today including active testing of pacing thresholds shows normal function. No permanent adjustments with the device settings. Note 77% atrial pacing and roughly 11% ventricular pacing. Estimated device longevity is uncertain. Note that this model is prone to rapid battery depletion. She is not pacemaker dependent

## 2013-08-11 NOTE — Assessment & Plan Note (Signed)
Probably due to aortic valve sclerosis

## 2013-08-13 ENCOUNTER — Encounter: Payer: Self-pay | Admitting: Cardiovascular Disease

## 2013-08-16 ENCOUNTER — Ambulatory Visit (INDEPENDENT_AMBULATORY_CARE_PROVIDER_SITE_OTHER): Payer: Medicare Other | Admitting: Family Medicine

## 2013-08-16 ENCOUNTER — Encounter: Payer: Self-pay | Admitting: Family Medicine

## 2013-08-16 VITALS — BP 110/72 | HR 64 | Temp 98.1°F | Resp 14 | Wt 130.0 lb

## 2013-08-16 DIAGNOSIS — I4891 Unspecified atrial fibrillation: Secondary | ICD-10-CM

## 2013-08-16 DIAGNOSIS — Z23 Encounter for immunization: Secondary | ICD-10-CM

## 2013-08-16 NOTE — Addendum Note (Signed)
Addended by: Legrand Rams B on: 08/16/2013 10:34 AM   Modules accepted: Orders

## 2013-08-16 NOTE — Progress Notes (Signed)
Subjective:    Patient ID: Heather [name], female    DOB: October 10, 1933, 79 y.o.   MRN: [id number]  HPI   Patient is here today for her atrial fibrillation. She is here today for a Coumadin check. She denies any chest pain, shortness of breath, or palpitations. She denies any syncope or presyncope. Her INR today is 3.4. This is the third visit in a row her INR has been slightly supertherapeutic.  She is taking Coumadin 2 mg by mouth daily except for Mon and Fri.  She takes 1 mg on Mon/Fri.  Past Medical History  Diagnosis Date  . Hyperlipidemia   . Chronic LBP   . Gout 01/2012    right index and middle fingers  . Dysrhythmia     atrial fib  . Seizures 2009    seizure x 1; none since pacemaker insertion  . Dementia   . Scoliosis   . Hypertension     under control  . Full dentures   . Wears glasses   . Pacemaker     dr croitoru-[facility]  . Arthritis   . GERD (gastroesophageal reflux disease)   . Atrial fibrillation    Past Surgical History  Procedure Laterality Date  . Tonsillectomy    . Hernia repair    . Trigger finger release  04/24/2011    release A-1 pulley left index  . Carpal tunnel release  11/14/2009    right  . Appendectomy    . Abdominal hysterectomy    . Heel spur surgery      both feet  . Eye surgery      both cataracts  . Insert / replace / remove pacemaker  2009  . Shoulder arthroscopy  04/15/2006 - left    right:  10/18/2005, 09/24/2005  . Lumbar laminectomy/decompression microdiscectomy  02/06/2005; 10/21/2003    with fusions   . Back surgery  8295,6213  . Cardioversion  08/08/2010    for atrial fib.  . Trigger finger release  08/10/2003    right thumb  . Wrist arthroscopy  07/04/2010    left wrist; also left CTR  . Spinal growth rods    . Knee arthroscopy      right  . Trigger finger release  01/21/2012    Procedure: RELEASE TRIGGER FINGER/A-1 PULLEY;  Surgeon: Nicki Reaper, MD;  Location: [facility];  Service: Orthopedics;  Laterality: Right;   right ring finger  . I&d extremity  02/05/2012    Procedure: IRRIGATION AND DEBRIDEMENT EXTREMITY;  Surgeon: Nicki Reaper, MD;  Location: [facility];  Service: Orthopedics;  Laterality: Right;  debridement right index and middle fingers  . Trigger finger release Left 03/17/2013    Procedure: RELEASE TRIGGER FINGER/A-1 PULLEY LEFT MIDDLE, RING AND SMALL FINGERS;  Surgeon: Nicki Reaper, MD;  Location: [facility];  Service: Orthopedics;  Laterality: Left;   Current Outpatient Prescriptions on File Prior to Visit  Medication Sig Dispense Refill  . colchicine 0.6 MG tablet Take 0.6 mg by mouth daily.      . cyclobenzaprine (FLEXERIL) 10 MG tablet TAKE 1 TABLET BY MOUTH EVERY 8 HOURS ** USE ONLY AS NEEDED FOR BACK PAIN**  90 tablet  1  . diltiazem (CARDIZEM CD) 240 MG 24 hr capsule Take 1 capsule (240 mg total) by mouth daily. PM  30 capsule  4  . esomeprazole (NEXIUM) 40 MG capsule Take 1 capsule (40 mg total) by mouth daily before breakfast.  30  capsule  11  . fish oil-omega-3 fatty acids 1000 MG capsule Take 2 g by mouth daily.      . furosemide (LASIX) 40 MG tablet TAKE 1 TABLET BY MOUTH DAILY AS NEEDED FOR LEG SWELLING  30 tablet  3  . losartan (COZAAR) 50 MG tablet Take 100 mg by mouth daily. AM      . NAMENDA 10 MG tablet TAKE 1 TABLET BY MOUTH TWICE A DAY  180 tablet  3  . potassium chloride SA (K-DUR,KLOR-CON) 20 MEQ tablet Take 20 mEq by mouth as needed.      . sotalol (BETAPACE) 80 MG tablet TAKE 1 TABLET BY MOUTH TWICE A DAY  60 tablet  5  . warfarin (COUMADIN) 1 MG tablet        No current facility-administered medications on file prior to visit.   Allergies  Allergen Reactions  . Oxycodone Hcl Er Other (See Comments)    HALLUCINATIONS  . Prednisone Other (See Comments)    Hallucinations  . Aspirin Other (See Comments)    GI UPSET  . Codeine Other (See Comments)    GI UPSET  . Sulfa Antibiotics Other (See Comments)    GI UPSET   History   Social  History  . Marital Status: Married    Spouse Name: N/A    Number of Children: N/A  . Years of Education: N/A   Occupational History  . Not on file.   Social History Main Topics  . Smoking status: Never Smoker   . Smokeless tobacco: Never Used  . Alcohol Use: No  . Drug Use: No  . Sexual Activity: Not on file   Other Topics Concern  . Not on file   Social History Narrative  . No narrative on file     Review of Systems  All other systems reviewed and are negative.       Objective:   Physical Exam  Cardiovascular: Normal rate and normal heart sounds.  Exam reveals no gallop and no friction rub.   No murmur heard. Pulmonary/Chest: Effort normal and breath sounds normal. No respiratory distress. She has no wheezes. She has no rales. She exhibits no tenderness.  Abdominal: Soft. Bowel sounds are normal.  Musculoskeletal: She exhibits edema.   she has an irregularly irregular rhythm. She has trace bipedal edema.          Assessment & Plan:  1. A-fib Begin Coumadin 1mg  on Mon, Wed, Frid, Sat.  2 mg on Tues, Thurs, Sun.  Recheck INR in 2 weeks.  Patient was given a flu shot today. - PT with INR/Fingerstick; Standing - PT with INR/Fingerstick

## 2013-08-30 ENCOUNTER — Ambulatory Visit (INDEPENDENT_AMBULATORY_CARE_PROVIDER_SITE_OTHER): Payer: Medicare Other | Admitting: Family Medicine

## 2013-08-30 ENCOUNTER — Encounter: Payer: Self-pay | Admitting: Family Medicine

## 2013-08-30 VITALS — BP 110/60 | HR 64 | Temp 98.1°F | Resp 16 | Ht 61.0 in | Wt 130.0 lb

## 2013-08-30 DIAGNOSIS — I4891 Unspecified atrial fibrillation: Secondary | ICD-10-CM

## 2013-08-30 LAB — PT WITH INR/FINGERSTICK: INR, fingerstick: 2.2 — ABNORMAL HIGH (ref 0.80–1.20)

## 2013-08-30 NOTE — Progress Notes (Signed)
Subjective:    Patient ID: Heather [name], female    DOB: Dec 14, 1932, 79 y.o.   MRN: [id number]  HPI  08/16/13 Patient is here today for her atrial fibrillation. She is here today for a Coumadin check. She denies any chest pain, shortness of breath, or palpitations. She denies any syncope or presyncope. Her INR today is 3.4. This is the third visit in a row her INR has been slightly supertherapeutic.  She is taking Coumadin 2 mg by mouth daily except for Mon and Fri.  She takes 1 mg on Mon/Fri.  At that time, my plan was: 1. A-fib Begin Coumadin 1mg  on Mon, Wed, Frid, Sat.  2 mg on Tues, Thurs, Sun.  Recheck INR in 2 weeks.  Patient was given a flu shot today. - PT with INR/Fingerstick; Standing - PT with INR/Fingerstick  08/30/13  Today her INR is 2.2. She denies any bleeding or bruising. She is doing well he is she has no medical complaints Past Medical History  Diagnosis Date  . Hyperlipidemia   . Chronic LBP   . Gout 01/2012    right index and middle fingers  . Dysrhythmia     atrial fib  . Seizures 2009    seizure x 1; none since pacemaker insertion  . Dementia   . Scoliosis   . Hypertension     under control  . Full dentures   . Wears glasses   . Pacemaker     dr croitoru-[facility]  . Arthritis   . GERD (gastroesophageal reflux disease)   . Atrial fibrillation    Past Surgical History  Procedure Laterality Date  . Tonsillectomy    . Hernia repair    . Trigger finger release  04/24/2011    release A-1 pulley left index  . Carpal tunnel release  11/14/2009    right  . Appendectomy    . Abdominal hysterectomy    . Heel spur surgery      both feet  . Eye surgery      both cataracts  . Insert / replace / remove pacemaker  2009  . Shoulder arthroscopy  04/15/2006 - left    right:  10/18/2005, 09/24/2005  . Lumbar laminectomy/decompression microdiscectomy  02/06/2005; 10/21/2003    with fusions   . Back surgery  4098,[id number]  . Cardioversion  08/08/2010    for atrial fib.  .  Trigger finger release  08/10/2003    right thumb  . Wrist arthroscopy  07/04/2010    left wrist; also left CTR  . Spinal growth rods    . Knee arthroscopy      right  . Trigger finger release  01/21/2012    Procedure: RELEASE TRIGGER FINGER/A-1 PULLEY;  Surgeon: Nicki Reaper, MD;  Location: [facility];  Service: Orthopedics;  Laterality: Right;  right ring finger  . I&d extremity  02/05/2012    Procedure: IRRIGATION AND DEBRIDEMENT EXTREMITY;  Surgeon: Nicki Reaper, MD;  Location: [facility];  Service: Orthopedics;  Laterality: Right;  debridement right index and middle fingers  . Trigger finger release Left 03/17/2013    Procedure: RELEASE TRIGGER FINGER/A-1 PULLEY LEFT MIDDLE, RING AND SMALL FINGERS;  Surgeon: Nicki Reaper, MD;  Location: [facility];  Service: Orthopedics;  Laterality: Left;   Current Outpatient Prescriptions on File Prior to Visit  Medication Sig Dispense Refill  . colchicine 0.6 MG tablet Take 0.6 mg by mouth daily.      Marland Kitchen  cyclobenzaprine (FLEXERIL) 10 MG tablet TAKE 1 TABLET BY MOUTH EVERY 8 HOURS ** USE ONLY AS NEEDED FOR BACK PAIN**  90 tablet  1  . diltiazem (CARDIZEM CD) 240 MG 24 hr capsule Take 1 capsule (240 mg total) by mouth daily. PM  30 capsule  4  . esomeprazole (NEXIUM) 40 MG capsule Take 1 capsule (40 mg total) by mouth daily before breakfast.  30 capsule  11  . fish oil-omega-3 fatty acids 1000 MG capsule Take 2 g by mouth daily.      . furosemide (LASIX) 40 MG tablet TAKE 1 TABLET BY MOUTH DAILY AS NEEDED FOR LEG SWELLING  30 tablet  3  . losartan (COZAAR) 50 MG tablet Take 100 mg by mouth daily. AM      . NAMENDA 10 MG tablet TAKE 1 TABLET BY MOUTH TWICE A DAY  180 tablet  3  . potassium chloride SA (K-DUR,KLOR-CON) 20 MEQ tablet Take 20 mEq by mouth as needed.      . sotalol (BETAPACE) 80 MG tablet TAKE 1 TABLET BY MOUTH TWICE A DAY  60 tablet  5  . warfarin (COUMADIN) 1 MG tablet Coumadin 1mg  on Mon, Wed, Frid,  Sat.  2 mg on Tues, Thurs, Sun.       No current facility-administered medications on file prior to visit.   Allergies  Allergen Reactions  . Oxycodone Hcl Er Other (See Comments)    HALLUCINATIONS  . Prednisone Other (See Comments)    Hallucinations  . Aspirin Other (See Comments)    GI UPSET  . Codeine Other (See Comments)    GI UPSET  . Sulfa Antibiotics Other (See Comments)    GI UPSET   History   Social History  . Marital Status: Married    Spouse Name: N/A    Number of Children: N/A  . Years of Education: N/A   Occupational History  . Not on file.   Social History Main Topics  . Smoking status: Never Smoker   . Smokeless tobacco: Never Used  . Alcohol Use: No  . Drug Use: No  . Sexual Activity: Not on file   Other Topics Concern  . Not on file   Social History Narrative  . No narrative on file     Review of Systems  All other systems reviewed and are negative.       Objective:   Physical Exam  Cardiovascular: Normal rate and normal heart sounds.  Exam reveals no gallop and no friction rub.   No murmur heard. Pulmonary/Chest: Effort normal and breath sounds normal. No respiratory distress. She has no wheezes. She has no rales. She exhibits no tenderness.  Abdominal: Soft. Bowel sounds are normal.  Musculoskeletal: She exhibits edema.   she has an irregularly irregular rhythm. She has trace bipedal edema.          Assessment & Plan:  1. A-fib INR is therapeutic. Continue Coumadin 1 mg on Mondays, Wednesdays, Fridays, Saturdays and 2 mg on Tuesdays, Thursdays, Sunday.  Recheck in 4 weeks - PT with INR/Fingerstick

## 2013-09-08 ENCOUNTER — Emergency Department (HOSPITAL_COMMUNITY): Payer: Medicare Other

## 2013-09-08 ENCOUNTER — Telehealth: Payer: Self-pay | Admitting: Family Medicine

## 2013-09-08 ENCOUNTER — Inpatient Hospital Stay (HOSPITAL_COMMUNITY)
Admission: EM | Admit: 2013-09-08 | Discharge: 2013-09-16 | DRG: 388 | Disposition: A | Discharge status: Home / Self Care | Payer: Medicare Other | Attending: Internal Medicine | Admitting: Internal Medicine

## 2013-09-08 ENCOUNTER — Telehealth: Payer: Self-pay | Admitting: Cardiovascular Disease

## 2013-09-08 ENCOUNTER — Encounter (HOSPITAL_COMMUNITY): Payer: Self-pay | Admitting: Emergency Medicine

## 2013-09-08 DIAGNOSIS — G309 Alzheimer's disease, unspecified: Secondary | ICD-10-CM | POA: Diagnosis present

## 2013-09-08 DIAGNOSIS — R112 Nausea with vomiting, unspecified: Secondary | ICD-10-CM | POA: Diagnosis present

## 2013-09-08 DIAGNOSIS — I4891 Unspecified atrial fibrillation: Secondary | ICD-10-CM | POA: Diagnosis present

## 2013-09-08 DIAGNOSIS — F0390 Unspecified dementia without behavioral disturbance: Secondary | ICD-10-CM | POA: Diagnosis present

## 2013-09-08 DIAGNOSIS — K565 Intestinal adhesions [bands], unspecified as to partial versus complete obstruction: Principal | ICD-10-CM | POA: Diagnosis present

## 2013-09-08 DIAGNOSIS — I129 Hypertensive chronic kidney disease with stage 1 through stage 4 chronic kidney disease, or unspecified chronic kidney disease: Secondary | ICD-10-CM | POA: Diagnosis present

## 2013-09-08 DIAGNOSIS — K56609 Unspecified intestinal obstruction, unspecified as to partial versus complete obstruction: Secondary | ICD-10-CM | POA: Diagnosis present

## 2013-09-08 DIAGNOSIS — M129 Arthropathy, unspecified: Secondary | ICD-10-CM | POA: Diagnosis present

## 2013-09-08 DIAGNOSIS — R197 Diarrhea, unspecified: Secondary | ICD-10-CM | POA: Diagnosis present

## 2013-09-08 DIAGNOSIS — J690 Pneumonitis due to inhalation of food and vomit: Secondary | ICD-10-CM

## 2013-09-08 DIAGNOSIS — I10 Essential (primary) hypertension: Secondary | ICD-10-CM

## 2013-09-08 DIAGNOSIS — G8929 Other chronic pain: Secondary | ICD-10-CM | POA: Diagnosis present

## 2013-09-08 DIAGNOSIS — M4120 Other idiopathic scoliosis, site unspecified: Secondary | ICD-10-CM | POA: Diagnosis present

## 2013-09-08 DIAGNOSIS — R109 Unspecified abdominal pain: Secondary | ICD-10-CM | POA: Diagnosis present

## 2013-09-08 DIAGNOSIS — I519 Heart disease, unspecified: Secondary | ICD-10-CM | POA: Diagnosis present

## 2013-09-08 DIAGNOSIS — I5189 Other ill-defined heart diseases: Secondary | ICD-10-CM | POA: Diagnosis present

## 2013-09-08 DIAGNOSIS — F0280 Dementia in other diseases classified elsewhere without behavioral disturbance: Secondary | ICD-10-CM | POA: Diagnosis present

## 2013-09-08 DIAGNOSIS — E876 Hypokalemia: Secondary | ICD-10-CM | POA: Diagnosis present

## 2013-09-08 DIAGNOSIS — Z7901 Long term (current) use of anticoagulants: Secondary | ICD-10-CM

## 2013-09-08 DIAGNOSIS — M545 Low back pain, unspecified: Secondary | ICD-10-CM | POA: Diagnosis present

## 2013-09-08 DIAGNOSIS — Z950 Presence of cardiac pacemaker: Secondary | ICD-10-CM | POA: Diagnosis present

## 2013-09-08 DIAGNOSIS — N183 Chronic kidney disease, stage 3 unspecified: Secondary | ICD-10-CM | POA: Diagnosis present

## 2013-09-08 DIAGNOSIS — I4819 Other persistent atrial fibrillation: Secondary | ICD-10-CM | POA: Diagnosis present

## 2013-09-08 DIAGNOSIS — M109 Gout, unspecified: Secondary | ICD-10-CM | POA: Diagnosis present

## 2013-09-08 DIAGNOSIS — K219 Gastro-esophageal reflux disease without esophagitis: Secondary | ICD-10-CM | POA: Diagnosis present

## 2013-09-08 DIAGNOSIS — K5790 Diverticulosis of intestine, part unspecified, without perforation or abscess without bleeding: Secondary | ICD-10-CM | POA: Diagnosis present

## 2013-09-08 DIAGNOSIS — E785 Hyperlipidemia, unspecified: Secondary | ICD-10-CM | POA: Diagnosis present

## 2013-09-08 DIAGNOSIS — N179 Acute kidney failure, unspecified: Secondary | ICD-10-CM | POA: Diagnosis present

## 2013-09-08 HISTORY — DX: Diaphragmatic hernia without obstruction or gangrene: K44.9

## 2013-09-08 LAB — CBC WITH DIFFERENTIAL/PLATELET
Basophils Relative: 0 % (ref 0–1)
HCT: 33.6 % — ABNORMAL LOW (ref 36.0–46.0)
Hemoglobin: 11.6 g/dL — ABNORMAL LOW (ref 12.0–15.0)
Lymphocytes Relative: 11 % — ABNORMAL LOW (ref 12–46)
Lymphs Abs: 1 10*3/uL (ref 0.7–4.0)
MCHC: 34.5 g/dL (ref 30.0–36.0)
Monocytes Absolute: 0.6 10*3/uL (ref 0.1–1.0)
Monocytes Relative: 7 % (ref 3–12)
Neutro Abs: 7.8 10*3/uL — ABNORMAL HIGH (ref 1.7–7.7)

## 2013-09-08 LAB — COMPREHENSIVE METABOLIC PANEL
BUN: 28 mg/dL — ABNORMAL HIGH (ref 6–23)
CO2: 26 mEq/L (ref 19–32)
Chloride: 107 mEq/L (ref 96–112)
Creatinine, Ser: 1.61 mg/dL — ABNORMAL HIGH (ref 0.50–1.10)
GFR calc Af Amer: 34 mL/min — ABNORMAL LOW (ref >90)
GFR calc non Af Amer: 30 mL/min — ABNORMAL LOW (ref >90)
Glucose, Bld: 114 mg/dL — ABNORMAL HIGH (ref 70–99)
Total Bilirubin: 0.4 mg/dL (ref 0.3–1.2)

## 2013-09-08 LAB — URINALYSIS, ROUTINE W REFLEX MICROSCOPIC
Ketones, ur: NEGATIVE mg/dL
Leukocytes, UA: NEGATIVE
Protein, ur: NEGATIVE mg/dL
Urobilinogen, UA: 1 mg/dL (ref 0.0–1.0)

## 2013-09-08 LAB — POCT I-STAT TROPONIN I

## 2013-09-08 LAB — OCCULT BLOOD, POC DEVICE: Fecal Occult Bld: NEGATIVE

## 2013-09-08 MED ORDER — ACETAMINOPHEN 325 MG PO TABS
650.0000 mg | ORAL_TABLET | Freq: Four times a day (QID) | ORAL | Status: DC | PRN
  Administered 2013-09-09 – 2013-09-15 (×5): 650 mg via ORAL
  Filled 2013-09-08 (×6): qty 2

## 2013-09-08 MED ORDER — COLCHICINE 0.6 MG PO TABS
0.6000 mg | ORAL_TABLET | Freq: Every day | ORAL | Status: DC
  Administered 2013-09-09 – 2013-09-16 (×8): 0.6 mg via ORAL
  Filled 2013-09-08 (×8): qty 1

## 2013-09-08 MED ORDER — DEXTROSE 5 % IV SOLN
1.0000 g | INTRAVENOUS | Status: DC
  Administered 2013-09-09: 1 g via INTRAVENOUS
  Filled 2013-09-08 (×2): qty 10

## 2013-09-08 MED ORDER — ONDANSETRON HCL 4 MG/2ML IJ SOLN
4.0000 mg | Freq: Four times a day (QID) | INTRAMUSCULAR | Status: DC | PRN
  Administered 2013-09-10 – 2013-09-13 (×2): 4 mg via INTRAVENOUS
  Filled 2013-09-08: qty 2

## 2013-09-08 MED ORDER — SODIUM CHLORIDE 0.9 % IJ SOLN
3.0000 mL | Freq: Two times a day (BID) | INTRAMUSCULAR | Status: DC
  Administered 2013-09-09 – 2013-09-15 (×7): 3 mL via INTRAVENOUS

## 2013-09-08 MED ORDER — OMEGA-3 FATTY ACIDS 1000 MG PO CAPS: 2.0000 g | ORAL_CAPSULE | Freq: Every day | ORAL | Status: DC

## 2013-09-08 MED ORDER — ONDANSETRON HCL 4 MG PO TABS: 4.0000 mg | ORAL_TABLET | Freq: Four times a day (QID) | ORAL | Status: DC | PRN

## 2013-09-08 MED ORDER — DEXTROSE 5 % IV SOLN
500.0000 mg | INTRAVENOUS | Status: DC
  Administered 2013-09-09: 500 mg via INTRAVENOUS
  Filled 2013-09-08 (×2): qty 500

## 2013-09-08 MED ORDER — LOSARTAN POTASSIUM 50 MG PO TABS
100.0000 mg | ORAL_TABLET | Freq: Every day | ORAL | Status: DC
  Administered 2013-09-09 – 2013-09-16 (×8): 100 mg via ORAL
  Filled 2013-09-08 (×8): qty 2

## 2013-09-08 MED ORDER — WARFARIN SODIUM 1 MG PO TABS: 1.0000 mg | ORAL_TABLET | Freq: Every day | ORAL | Status: DC

## 2013-09-08 MED ORDER — SODIUM CHLORIDE 0.9 % IV SOLN
INTRAVENOUS | Status: AC
  Administered 2013-09-08 – 2013-09-09 (×2): via INTRAVENOUS

## 2013-09-08 MED ORDER — CYCLOBENZAPRINE HCL 10 MG PO TABS
10.0000 mg | ORAL_TABLET | Freq: Three times a day (TID) | ORAL | Status: DC | PRN
  Administered 2013-09-11 – 2013-09-13 (×2): 10 mg via ORAL
  Filled 2013-09-08 (×2): qty 1

## 2013-09-08 MED ORDER — PANTOPRAZOLE SODIUM 40 MG PO TBEC
80.0000 mg | DELAYED_RELEASE_TABLET | Freq: Every day | ORAL | Status: DC
  Administered 2013-09-10 – 2013-09-16 (×7): 80 mg via ORAL
  Filled 2013-09-08 (×5): qty 2

## 2013-09-08 MED ORDER — WARFARIN - PHARMACIST DOSING INPATIENT
Freq: Every day | Status: DC
  Administered 2013-09-09 – 2013-09-14 (×4)

## 2013-09-08 MED ORDER — ONDANSETRON HCL 4 MG/2ML IJ SOLN
4.0000 mg | Freq: Once | INTRAMUSCULAR | Status: AC
  Administered 2013-09-08: 4 mg via INTRAVENOUS
  Filled 2013-09-08: qty 2

## 2013-09-08 MED ORDER — MEMANTINE HCL 10 MG PO TABS
10.0000 mg | ORAL_TABLET | Freq: Two times a day (BID) | ORAL | Status: DC
  Administered 2013-09-09 – 2013-09-16 (×15): 10 mg via ORAL
  Filled 2013-09-08 (×18): qty 1

## 2013-09-08 MED ORDER — MORPHINE SULFATE 4 MG/ML IJ SOLN
4.0000 mg | Freq: Once | INTRAMUSCULAR | Status: AC
  Administered 2013-09-08: 4 mg via INTRAVENOUS
  Filled 2013-09-08: qty 1

## 2013-09-08 MED ORDER — SOTALOL HCL 80 MG PO TABS
80.0000 mg | ORAL_TABLET | Freq: Two times a day (BID) | ORAL | Status: DC
  Administered 2013-09-09 – 2013-09-16 (×15): 80 mg via ORAL
  Filled 2013-09-08 (×18): qty 1

## 2013-09-08 MED ORDER — DILTIAZEM HCL ER COATED BEADS 240 MG PO CP24
240.0000 mg | ORAL_CAPSULE | Freq: Every day | ORAL | Status: DC
  Administered 2013-09-09 – 2013-09-11 (×3): 240 mg via ORAL
  Filled 2013-09-08 (×5): qty 1

## 2013-09-08 MED ORDER — HYDROMORPHONE HCL PF 1 MG/ML IJ SOLN: 1.0000 mg | Freq: Once | INTRAMUSCULAR | Status: DC

## 2013-09-08 MED ORDER — SODIUM CHLORIDE 0.9 % IV BOLUS (SEPSIS)
1000.0000 mL | Freq: Once | INTRAVENOUS | Status: AC
  Administered 2013-09-08: 1000 mL via INTRAVENOUS

## 2013-09-08 MED ORDER — LORAZEPAM 2 MG/ML IJ SOLN
1.0000 mg | Freq: Once | INTRAMUSCULAR | Status: AC
  Administered 2013-09-08: 1 mg via INTRAVENOUS
  Filled 2013-09-08: qty 1

## 2013-09-08 MED ORDER — IOHEXOL 300 MG/ML  SOLN: 25.0000 mL | INTRAMUSCULAR | Status: DC

## 2013-09-08 MED ORDER — ACETAMINOPHEN 650 MG RE SUPP
650.0000 mg | Freq: Four times a day (QID) | RECTAL | Status: DC | PRN
  Administered 2013-09-14: 650 mg via RECTAL
  Filled 2013-09-08: qty 1

## 2013-09-08 MED ORDER — WARFARIN SODIUM 1 MG PO TABS
1.0000 mg | ORAL_TABLET | Freq: Once | ORAL | Status: AC
  Filled 2013-09-08: qty 1

## 2013-09-08 MED ORDER — OMEGA-3-ACID ETHYL ESTERS 1 G PO CAPS
2.0000 g | ORAL_CAPSULE | Freq: Every day | ORAL | Status: DC
  Administered 2013-09-09 – 2013-09-16 (×5): 2 g via ORAL
  Filled 2013-09-08 (×8): qty 2

## 2013-09-08 NOTE — ED Notes (Signed)
Attempted to insert an NG tube 10 [place]. Resistant met and had to remove tube. 2L 02 applied to pt for comfort.

## 2013-09-08 NOTE — ED Notes (Signed)
Patient transported to CT 

## 2013-09-08 NOTE — Consult Note (Signed)
Reason for Consult:vomiting Referring Physician: Dr. Prudy Feeler is an 79 y.o. female.  HPI: The pt is a 79 yo wf who began having an upset stomach yesterday. She had several episodes of vomiting last night and today. She was brought to ER by family where CT showed evidence of possible aspiration as well as possible bowel obstruction. She has dementia and doesn't answer many questions appropriately but seems pleasant and seems to not feel well.  Past Medical History  Diagnosis Date  . Hyperlipidemia   . Chronic LBP   . Gout 01/2012    right index and middle fingers  . Dysrhythmia     atrial fib  . Dementia   . Scoliosis   . Hypertension     under control  . Full dentures   . Wears glasses   . Pacemaker     dr croitoru-[facility]  . Arthritis   . GERD (gastroesophageal reflux disease)   . Atrial fibrillation   . Hiatal hernia   . Seizures 2009    seizure x 1; none since pacemaker insertion    Past Surgical History  Procedure Laterality Date  . Tonsillectomy    . Hernia repair    . Trigger finger release  04/24/2011    release A-1 pulley left index  . Carpal tunnel release  11/14/2009    right  . Appendectomy    . Abdominal hysterectomy    . Heel spur surgery      both feet  . Eye surgery      both cataracts  . Insert / replace / remove pacemaker  2009  . Shoulder arthroscopy  04/15/2006 - left    right:  10/18/2005, 09/24/2005  . Lumbar laminectomy/decompression microdiscectomy  02/06/2005; 10/21/2003    with fusions   . Back surgery  [id number]  . Cardioversion  08/08/2010    for atrial fib.  . Trigger finger release  08/10/2003    right thumb  . Wrist arthroscopy  07/04/2010    left wrist; also left CTR  . Spinal growth rods    . Knee arthroscopy      right  . Trigger finger release  01/21/2012    Procedure: RELEASE TRIGGER FINGER/A-1 PULLEY;  Surgeon: Nicki Reaper, MD;  Location: [facility];  Service: Orthopedics;  Laterality: Right;  right ring  finger  . I&d extremity  02/05/2012    Procedure: IRRIGATION AND DEBRIDEMENT EXTREMITY;  Surgeon: Nicki Reaper, MD;  Location: [facility];  Service: Orthopedics;  Laterality: Right;  debridement right index and middle fingers  . Trigger finger release Left 03/17/2013    Procedure: RELEASE TRIGGER FINGER/A-1 PULLEY LEFT MIDDLE, RING AND SMALL FINGERS;  Surgeon: Nicki Reaper, MD;  Location: [facility];  Service: Orthopedics;  Laterality: Left;    History reviewed. No pertinent family history.  Social History:  reports that she has never smoked. She has never used smokeless tobacco. She reports that she does not drink alcohol or use illicit drugs.  Allergies:  Allergies  Allergen Reactions  . Oxycodone Hcl Other (See Comments)    HALLUCINATIONS  . Prednisone Other (See Comments)    Hallucinations  . Aspirin Other (See Comments)    GI UPSET  . Codeine Other (See Comments)    GI UPSET  . Sulfa Antibiotics Other (See Comments)    GI UPSET    Medications: I have reviewed the patient's current medications.  Results for orders placed during  the hospital encounter of 09/08/13 (from the past 48 hour(s))  CBC WITH DIFFERENTIAL     Status: Abnormal   Collection Time    09/08/13 12:45 PM      Result Value Range   WBC 9.5  4.0 - 10.5 K/uL   RBC 3.96  3.87 - 5.11 MIL/uL   Hemoglobin 11.6 (*) 12.0 - 15.0 g/dL   HCT 16.1 (*) 09.6 - 04.5 %   MCV 84.8  78.0 - 100.0 fL   MCH 29.3  26.0 - 34.0 pg   MCHC 34.5  30.0 - 36.0 g/dL   RDW 40.9  81.1 - 91.4 %   Platelets 206  150 - 400 K/uL   Neutrophils Relative % 83 (*) 43 - 77 %   Neutro Abs 7.8 (*) 1.7 - 7.7 K/uL   Lymphocytes Relative 11 (*) 12 - 46 %   Lymphs Abs 1.0  0.7 - 4.0 K/uL   Monocytes Relative 7  3 - 12 %   Monocytes Absolute 0.6  0.1 - 1.0 K/uL   Eosinophils Relative 0  0 - 5 %   Eosinophils Absolute 0.0  0.0 - 0.7 K/uL   Basophils Relative 0  0 - 1 %   Basophils Absolute 0.0  0.0 - 0.1 K/uL   COMPREHENSIVE METABOLIC PANEL     Status: Abnormal   Collection Time    09/08/13 12:45 PM      Result Value Range   Sodium 143  135 - 145 mEq/L   Potassium 3.6  3.5 - 5.1 mEq/L   Chloride 107  96 - 112 mEq/L   CO2 26  19 - 32 mEq/L   Glucose, Bld 114 (*) 70 - 99 mg/dL   BUN 28 (*) 6 - 23 mg/dL   Creatinine, Ser 7.82 (*) 0.50 - 1.10 mg/dL   Calcium 8.9  8.4 - 95.6 mg/dL   Total Protein 6.5  6.0 - 8.3 g/dL   Albumin 3.3 (*) 3.5 - 5.2 g/dL   AST 17  0 - 37 U/L   ALT 10  0 - 35 U/L   Alkaline Phosphatase 91  39 - 117 U/L   Total Bilirubin 0.4  0.3 - 1.2 mg/dL   GFR calc non Af Amer 30 (*) >90 mL/min   GFR calc Af Amer 34 (*) >90 mL/min   Comment: (NOTE)     The eGFR has been calculated using the CKD EPI equation.     This calculation has not been validated in all clinical situations.     eGFR's persistently <90 mL/min signify possible Chronic Kidney     Disease.  OCCULT BLOOD, POC DEVICE     Status: None   Collection Time    09/08/13 12:54 PM      Result Value Range   Fecal Occult Bld NEGATIVE  NEGATIVE  PROTIME-INR     Status: Abnormal   Collection Time    09/08/13  1:36 PM      Result Value Range   Prothrombin Time 24.5 (*) 11.6 - 15.2 seconds   INR 2.29 (*) 0.00 - 1.49  CG4 I-STAT (LACTIC ACID)     Status: None   Collection Time    09/08/13  2:03 PM      Result Value Range   Lactic Acid, Venous 1.19  0.5 - 2.2 mmol/L  URINALYSIS, ROUTINE W REFLEX MICROSCOPIC     Status: None   Collection Time    09/08/13  4:21 PM  Result Value Range   Color, Urine YELLOW  YELLOW   APPearance CLEAR  CLEAR   Specific Gravity, Urine 1.020  1.005 - 1.030   pH 5.5  5.0 - 8.0   Glucose, UA NEGATIVE  NEGATIVE mg/dL   Hgb urine dipstick NEGATIVE  NEGATIVE   Bilirubin Urine NEGATIVE  NEGATIVE   Ketones, ur NEGATIVE  NEGATIVE mg/dL   Protein, ur NEGATIVE  NEGATIVE mg/dL   Urobilinogen, UA 1.0  0.0 - 1.0 mg/dL   Nitrite NEGATIVE  NEGATIVE   Leukocytes, UA NEGATIVE  NEGATIVE    Comment: MICROSCOPIC NOT DONE ON URINES WITH NEGATIVE PROTEIN, BLOOD, LEUKOCYTES, NITRITE, OR GLUCOSE <1000 mg/dL.  POCT I-STAT TROPONIN I     Status: None   Collection Time    09/08/13  5:12 PM      Result Value Range   Troponin i, poc 0.00  0.00 - 0.08 ng/mL   Comment 3            Comment: Due to the release kinetics of cTnI,     a negative result within the first hours     of the onset of symptoms does not rule out     myocardial infarction with certainty.     If myocardial infarction is still suspected,     repeat the test at appropriate intervals.    Ct Abdomen Pelvis Wo Contrast  09/08/2013   CLINICAL DATA:  Nausea and vomiting  EXAM: CT ABDOMEN AND PELVIS WITHOUT CONTRAST  TECHNIQUE: Multidetector CT imaging of the abdomen and pelvis was performed following the standard protocol without intravenous contrast.  COMPARISON:  Most recent prior CT abdomen/ pelvis 08/04/2010  FINDINGS: Lower Chest: Patchy airspace disease and consolidation in the inferior aspect of the right lower and middle lobes. Mild dependent atelectasis noted in the left lower lobe. There is an associated small right pleural effusion. Mild left atrial enlargement. Incompletely imaged cardiac rhythm enhanced device with leads terminating in the right ventricular apex and right atrium. No pericardial effusion. Ingested oral contrast material is noted throughout the length of the visualized thoracic esophagus suggesting reflux.  Abdomen: Unenhanced CT was performed per clinician order. Lack of IV contrast limits sensitivity and specificity, especially for evaluation of abdominal/pelvic solid viscera. Stomach is mildly distended with ingested contrast. No acute abnormality. Unremarkable CT appearance of the spleen, adrenal glands and the pancreas. Stable subcentimeter hypoattenuating lesion in hepatic segment 2 dating back to 2011 is highly likely benign. No new acute lesion identified. High attenuation material layering within the  gallbladder may reflect sludge and/ or small stones. No intra or extrahepatic biliary ductal dilatation.  Renal cortical atrophy bilaterally. No hydronephrosis or nephrolithiasis. Extrarenal pelvis on the right. Evaluation slightly limited by streak artifact related to posterior lumbar fixation hardware.  Multiple loops of dilated and fluid-filled small bowel with air-fluid levels noted in the left hemiabdomen. The the probable transition point in the left lower quadrant. No free air. Small amount of free fluid in the perisplenic space and anatomic pelvis. Extensive colonic diverticular disease without evidence of active inflammation.  Pelvis: Bladder is distended with urine. Surgical changes of prior hysterectomy. Trace free fluid. Probable bilateral oophorectomy.  Bones/Soft Tissues: No acute fracture or aggressive appearing lytic or blastic osseous lesion. L1-L5 posterior lumbar interbody fusion with bilateral pedicle screw and rod construct. Interbody grafts are noted at each level. The bones appear osteopenic. Detailed evaluation is limited by streak artifact. Degenerative changes noted at T12-L1.  Vascular: Limited evaluation in  the absence of intravenous contrast. Atherosclerotic vascular calcifications without aneurysmal dilatation.  IMPRESSION: 1. High-grade small bowel obstruction with a small amount of associated free fluid but no free air. Probable transition point in the left lower quadrant. Suspect underlying adhesive disease. 2. Patchy infiltrate and consolidation in the dependent right middle and lower lobes concerning for aspiration given the clinical history of small bowel obstruction and emesis. 3. Reflux of oral contrast material throughout the visualized thoracic esophagus. 4. Gallbladder sludge and/ or small stones 5. Colonic diverticular disease without CT evidence of active inflammation. 6. Additional ancillary findings as above. These results were called by telephone at the time of  interpretation on 09/08/2013 at 6:33 PM to Dr. Dr. Renae Gloss, who verbally acknowledged these results.   Electronically Signed   By: Malachy Moan M.D.   On: 09/08/2013 18:34    Review of Systems  Constitutional: Negative.   HENT: Negative.   Eyes: Negative.   Respiratory: Negative.   Cardiovascular: Negative.   Gastrointestinal: Positive for nausea, vomiting and abdominal pain.  Genitourinary: Negative.   Musculoskeletal: Positive for back pain.  Skin: Negative.   Neurological: Negative.   Endo/Heme/Allergies: Bruises/bleeds easily.  Psychiatric/Behavioral: Positive for memory loss.   Blood pressure 126/74, pulse 97, temperature 98.6 F (37 C), temperature source Oral, resp. rate 20, SpO2 91.00%. Physical Exam  Constitutional: She appears well-developed and well-nourished.  HENT:  Head: Normocephalic and atraumatic.  Eyes: Conjunctivae and EOM are normal. Pupils are equal, round, and reactive to light.  Neck: Normal range of motion. Neck supple.  Cardiovascular:  Irregular rhythm  Respiratory: Effort normal and breath sounds normal.  GI: Soft.  There is tenderness on right abdomen. There are some bowel sounds present. No peritonitis  Musculoskeletal: Normal range of motion.  Neurological: She is alert.  Demented. Pleasant. Not oriented to place or time  Skin: Skin is warm and dry.  Psychiatric:  dementia    Assessment/Plan: The pt appears to have a bowel obstruction most likely from adhesions from previous surgery. She may have also aspirated when she vomited. I would recommend placement of NG and strict bowel rest including meds( these will need to be switched to IV). She will need correction of her coags in case she needs surgery. She is high risk for complication given her cardiac history. We will follow closely with you and repeat abdominal xrays in am  TOTH III,Jawann Urbani S 09/08/2013, 9:27 PM

## 2013-09-08 NOTE — Progress Notes (Signed)
ANTICOAGULATION CONSULT NOTE - Initial Consult  Pharmacy Consult for coumadin Indication: atrial fibrillation  Allergies  Allergen Reactions  . Oxycodone Hcl Other (See Comments)    HALLUCINATIONS  . Prednisone Other (See Comments)    Hallucinations  . Aspirin Other (See Comments)    GI UPSET  . Codeine Other (See Comments)    GI UPSET  . Sulfa Antibiotics Other (See Comments)    GI UPSET    Patient Measurements:   Heparin Dosing Weight:   Vital Signs: Temp: 98.6 F (37 C) (10/08 1226) Temp src: Oral (10/08 1226) BP: 126/74 mmHg (10/08 2000) Pulse Rate: 97 (10/08 2000)  Labs:  Recent Labs  09/08/13 1245 09/08/13 1336  HGB 11.6*  --   HCT 33.6*  --   PLT 206  --   LABPROT  --  24.5*  INR  --  2.29*  CREATININE 1.61*  --     The CrCl is unknown because both a height and weight (above a minimum accepted value) are required for this calculation.   Medical History: Past Medical History  Diagnosis Date  . Hyperlipidemia   . Chronic LBP   . Gout 01/2012    right index and middle fingers  . Dysrhythmia     atrial fib  . Dementia   . Scoliosis   . Hypertension     under control  . Full dentures   . Wears glasses   . Pacemaker     dr croitoru-[facility]  . Arthritis   . GERD (gastroesophageal reflux disease)   . Atrial fibrillation   . Hiatal hernia   . Seizures 2009    seizure x 1; none since pacemaker insertion    Medications:  Scheduled:  . azithromycin  500 mg Intravenous Q24H  . cefTRIAXone (ROCEPHIN)  IV  1 g Intravenous Q24H  . colchicine  0.6 mg Oral Daily  . diltiazem  240 mg Oral Daily  . fish oil-omega-3 fatty acids  2 g Oral Daily  . losartan  100 mg Oral Daily  . memantine  10 mg Oral BID  . [START ON 09/09/2013] pantoprazole  80 mg Oral Q1200  . sodium chloride  3 mL Intravenous Q12H  . sotalol  80 mg Oral BID   Infusions:  . sodium chloride      Assessment: 79 yo female with hx of afib will be continued on coumadin.  Home coumadin  dose was 1mg  MWFSa and 2mg  other days.  INR today is 2.29.  Last coumadin dose was 09/07/13 Goal of Therapy:  INR 2-3 Monitor platelets by anticoagulation protocol: Yes   Plan:  1) Coumadin 1mg  po x1 tonight.   2) Daily PT/INR.  If INR is good on Friday, can resume home coumadin dose.  Sierra Spargo, Tsz-Yin 09/08/2013,9:13 PM

## 2013-09-08 NOTE — ED Notes (Signed)
Floor RN informed about unsuccessful NG tube insertion. Charge nurse also informed.

## 2013-09-08 NOTE — ED Notes (Signed)
Per pt's husband, pt ambulates at home with a cane. Per pt's husband, pt is able to use the toilet at home by herself.

## 2013-09-08 NOTE — ED Notes (Signed)
Informed MD 2 RNs were unsuccessful in inserting an NG tube.

## 2013-09-08 NOTE — Telephone Encounter (Signed)
Called spoke to husband. Informed him to go to ER concerning his wife He states that EMS is there now to transport her,spoke to an EMS personnel ,informed them that Dr Royann Shivers is her cardiologist if ER needs cardiology . Notified Luke by phone in case he is called

## 2013-09-08 NOTE — ED Notes (Signed)
Pt unable to urinate. MD informed

## 2013-09-08 NOTE — Telephone Encounter (Signed)
Calling because Mrs.Yera is vomiting and have diarrhea so bad  and is also complaining of shortness of breath . Please call    Thanks

## 2013-09-08 NOTE — Progress Notes (Signed)
NGT inserted. 200cc brown liquid returned. Connected to LIWS. Patient tolerated well.

## 2013-09-08 NOTE — ED Provider Notes (Signed)
CSN: [id number]     Arrival date & time 09/08/13  1217 History   First MD Initiated Contact with Patient 09/08/13 1232     Chief Complaint  Patient presents with  . Emesis   (Consider location/radiation/quality/duration/timing/severity/associated sxs/prior Treatment) HPI  This is a 79 year old female who presents with abdominal pain and emesis. History was taken from the patient's husband. Patient has a history of Alzheimer's. The patient reportedly has had decreased by mouth intake since yesterday. The patient's husband states that she was up all night complaining of abdominal pain. She had multiple episodes of dark diarrhea this morning started at 6 AM. The husband also reports multiple episodes of dark vomiting. He denies any blood noted.  They deny any recent fevers. Patient is noncontributory to history taking.  Past Medical History  Diagnosis Date  . Hyperlipidemia   . Chronic LBP   . Gout 01/2012    right index and middle fingers  . Dysrhythmia     atrial fib  . Seizures 2009    seizure x 1; none since pacemaker insertion  . Dementia   . Scoliosis   . Hypertension     under control  . Full dentures   . Wears glasses   . Pacemaker     dr croitoru-[facility]  . Arthritis   . GERD (gastroesophageal reflux disease)   . Atrial fibrillation   . Hiatal hernia    Past Surgical History  Procedure Laterality Date  . Tonsillectomy    . Hernia repair    . Trigger finger release  04/24/2011    release A-1 pulley left index  . Carpal tunnel release  11/14/2009    right  . Appendectomy    . Abdominal hysterectomy    . Heel spur surgery      both feet  . Eye surgery      both cataracts  . Insert / replace / remove pacemaker  2009  . Shoulder arthroscopy  04/15/2006 - left    right:  10/18/2005, 09/24/2005  . Lumbar laminectomy/decompression microdiscectomy  02/06/2005; 10/21/2003    with fusions   . Back surgery  9562,1308  . Cardioversion  08/08/2010    for atrial fib.  . Trigger  finger release  08/10/2003    right thumb  . Wrist arthroscopy  07/04/2010    left wrist; also left CTR  . Spinal growth rods    . Knee arthroscopy      right  . Trigger finger release  01/21/2012    Procedure: RELEASE TRIGGER FINGER/A-1 PULLEY;  Surgeon: Nicki Reaper, MD;  Location: [facility];  Service: Orthopedics;  Laterality: Right;  right ring finger  . I&d extremity  02/05/2012    Procedure: IRRIGATION AND DEBRIDEMENT EXTREMITY;  Surgeon: Nicki Reaper, MD;  Location: [facility];  Service: Orthopedics;  Laterality: Right;  debridement right index and middle fingers  . Trigger finger release Left 03/17/2013    Procedure: RELEASE TRIGGER FINGER/A-1 PULLEY LEFT MIDDLE, RING AND SMALL FINGERS;  Surgeon: Nicki Reaper, MD;  Location: [facility];  Service: Orthopedics;  Laterality: Left;   History reviewed. No pertinent family history. History  Substance Use Topics  . Smoking status: Never Smoker   . Smokeless tobacco: Never Used  . Alcohol Use: No   OB History   Grav Para Term Preterm Abortions TAB SAB Ect Mult Living  Review of Systems  Unable to perform ROS: Dementia    Allergies  Oxycodone hcl; Prednisone; Aspirin; Codeine; and Sulfa antibiotics  Home Medications   Current Outpatient Rx  Name  Route  Sig  Dispense  Refill  . colchicine 0.6 MG tablet   Oral   Take 0.6 mg by mouth daily.         . cyclobenzaprine (FLEXERIL) 10 MG tablet   Oral   Take 10 mg by mouth 3 (three) times daily as needed for muscle spasms.         Marland Kitchen diltiazem (CARDIZEM CD) 240 MG 24 hr capsule   Oral   Take 1 capsule (240 mg total) by mouth daily. PM   30 capsule   4   . esomeprazole (NEXIUM) 40 MG capsule   Oral   Take 1 capsule (40 mg total) by mouth daily before breakfast.   30 capsule   11   . fish oil-omega-3 fatty acids 1000 MG capsule   Oral   Take 2 g by mouth daily.         . furosemide (LASIX) 40 MG tablet    Oral   Take 40 mg by mouth daily as needed for fluid.         Marland Kitchen losartan (COZAAR) 50 MG tablet   Oral   Take 100 mg by mouth daily. AM         . memantine (NAMENDA) 10 MG tablet   Oral   Take 10 mg by mouth 2 (two) times daily.         . potassium chloride SA (K-DUR,KLOR-CON) 20 MEQ tablet   Oral   Take 20 mEq by mouth as needed.         . sotalol (BETAPACE) 80 MG tablet   Oral   Take 80 mg by mouth 2 (two) times daily.         Marland Kitchen warfarin (COUMADIN) 1 MG tablet   Oral   Take 1-2 mg by mouth daily. Coumadin 1mg  on Mon, Wed, Frid, Sat.  2 mg on Tues, Thurs, Sun.          BP 148/75  Pulse 108  Temp(Src) 98.6 F (37 C) (Oral)  Resp 19  SpO2 100% Physical Exam  Nursing note and vitals reviewed. Constitutional: She is oriented to person, place, and time.  Elderly, ill-appearing, nontoxic  HENT:  Head: Normocephalic and atraumatic.  mucous membranes dry  Eyes: Pupils are equal, round, and reactive to light.  Neck: Neck supple.  Cardiovascular: Normal rate, regular rhythm and normal heart sounds.   No murmur heard. Pulmonary/Chest: Effort normal and breath sounds normal. No respiratory distress. She has no wheezes.  Abdominal: Soft. Bowel sounds are normal. There is no rebound.  Diffuse tenderness to palpation with voluntary guarding  Neurological: She is alert and oriented to person, place, and time.  Skin: Skin is warm and dry.  Psychiatric: She has a normal mood and affect.    ED Course  Procedures (including critical care time) Labs Review Labs Reviewed  CBC WITH DIFFERENTIAL - Abnormal; Notable for the following:    Hemoglobin 11.6 (*)    HCT 33.6 (*)    Neutrophils Relative % 83 (*)    Neutro Abs 7.8 (*)    Lymphocytes Relative 11 (*)    All other components within normal limits  COMPREHENSIVE METABOLIC PANEL - Abnormal; Notable for the following:    Glucose, Bld 114 (*)    BUN 28 (*)  Creatinine, Ser 1.61 (*)    Albumin 3.3 (*)    GFR calc  non Af Amer 30 (*)    GFR calc Af Amer 34 (*)    All other components within normal limits  PROTIME-INR - Abnormal; Notable for the following:    Prothrombin Time 24.5 (*)    INR 2.29 (*)    All other components within normal limits  URINALYSIS, ROUTINE W REFLEX MICROSCOPIC  OCCULT BLOOD, POC DEVICE  CG4 I-STAT (LACTIC ACID)  POCT I-STAT TROPONIN I   Imaging Review Ct Abdomen Pelvis Wo Contrast  09/08/2013   CLINICAL DATA:  Nausea and vomiting  EXAM: CT ABDOMEN AND PELVIS WITHOUT CONTRAST  TECHNIQUE: Multidetector CT imaging of the abdomen and pelvis was performed following the standard protocol without intravenous contrast.  COMPARISON:  Most recent prior CT abdomen/ pelvis 08/04/2010  FINDINGS: Lower Chest: Patchy airspace disease and consolidation in the inferior aspect of the right lower and middle lobes. Mild dependent atelectasis noted in the left lower lobe. There is an associated small right pleural effusion. Mild left atrial enlargement. Incompletely imaged cardiac rhythm enhanced device with leads terminating in the right ventricular apex and right atrium. No pericardial effusion. Ingested oral contrast material is noted throughout the length of the visualized thoracic esophagus suggesting reflux.  Abdomen: Unenhanced CT was performed per clinician order. Lack of IV contrast limits sensitivity and specificity, especially for evaluation of abdominal/pelvic solid viscera. Stomach is mildly distended with ingested contrast. No acute abnormality. Unremarkable CT appearance of the spleen, adrenal glands and the pancreas. Stable subcentimeter hypoattenuating lesion in hepatic segment 2 dating back to 2011 is highly likely benign. No new acute lesion identified. High attenuation material layering within the gallbladder may reflect sludge and/ or small stones. No intra or extrahepatic biliary ductal dilatation.  Renal cortical atrophy bilaterally. No hydronephrosis or nephrolithiasis. Extrarenal  pelvis on the right. Evaluation slightly limited by streak artifact related to posterior lumbar fixation hardware.  Multiple loops of dilated and fluid-filled small bowel with air-fluid levels noted in the left hemiabdomen. The the probable transition point in the left lower quadrant. No free air. Small amount of free fluid in the perisplenic space and anatomic pelvis. Extensive colonic diverticular disease without evidence of active inflammation.  Pelvis: Bladder is distended with urine. Surgical changes of prior hysterectomy. Trace free fluid. Probable bilateral oophorectomy.  Bones/Soft Tissues: No acute fracture or aggressive appearing lytic or blastic osseous lesion. L1-L5 posterior lumbar interbody fusion with bilateral pedicle screw and rod construct. Interbody grafts are noted at each level. The bones appear osteopenic. Detailed evaluation is limited by streak artifact. Degenerative changes noted at T12-L1.  Vascular: Limited evaluation in the absence of intravenous contrast. Atherosclerotic vascular calcifications without aneurysmal dilatation.  IMPRESSION: 1. High-grade small bowel obstruction with a small amount of associated free fluid but no free air. Probable transition point in the left lower quadrant. Suspect underlying adhesive disease. 2. Patchy infiltrate and consolidation in the dependent right middle and lower lobes concerning for aspiration given the clinical history of small bowel obstruction and emesis. 3. Reflux of oral contrast material throughout the visualized thoracic esophagus. 4. Gallbladder sludge and/ or small stones 5. Colonic diverticular disease without CT evidence of active inflammation. 6. Additional ancillary findings as above. These results were called by telephone at the time of interpretation on 09/08/2013 at 6:33 PM to Dr. Dr. Renae Gloss, who verbally acknowledged these results.   Electronically Signed   By: Isac Caddy.D.  On: 09/08/2013 18:34   EKG:  SInus  tachycardia with rate of 101, T wave inversions in the inferior and lateral leads with 1 mm ST depression in inferior and lateral leads.  Changes from prior. MDM  No diagnosis found.  Patient presents with abdominal pain, n/v, and diarrhea.  Mildly tachycardic.  Abdominal pain with guarding on exam.  Lab work obtained.  Normal lactate.  Patient given pain meds, antiemetic and fluids.  CT scan pending at time of my sign out.    Shon Baton, MD 09/08/13 [phone]

## 2013-09-08 NOTE — ED Notes (Signed)
Pt's dentures placed in denture box at bedside.

## 2013-09-08 NOTE — ED Notes (Signed)
CT paged. 

## 2013-09-08 NOTE — H&P (Addendum)
Patient's PCP: Leo Grosser, MD  Chief Complaint: Abdominal pain, diarrhea, nausea and vomiting  History of Present Illness: Heather [name] is a 79 y.o. Caucasian female with history of hypertension, A. fib on chronic anticoagulation, chronic low back pain, mild dementia, hypertension, status post pacemaker, arthritis, GERD, and scoliosis who presents with the above complaints.  Patient due to her dementia cannot provide any history.  Most of the history was obtained from her patient's husband at bedside.  Patient symptoms started yesterday with abdominal pain.  She had persistent abdominal pain during the night.  This morning at 9 a.m. she started having diarrhea and then started vomiting.  Husband noted that both her stools and emesis were coffee-ground in color.  He contacted her primary care physician who instructed the patient come in to the emergency department for further evaluation.  She denies any recent fevers, chills, chest pain, shortness of breath, headaches or vision changes.  In the emergency department patient had imaging which showed high-grade small bowel obstruction.  Imaging also showed patchy infiltrate and consolidation in the dependent right middle and lower lobes concerning for aspiration.  Hospitalist service was asked to admit the patient for further care and management.  Review of Systems: All systems reviewed with the patient and positive as per history of present illness, otherwise all other systems are negative.  Past Medical History  Diagnosis Date  . Hyperlipidemia   . Chronic LBP   . Gout 01/2012    right index and middle fingers  . Dysrhythmia     atrial fib  . Dementia   . Scoliosis   . Hypertension     under control  . Full dentures   . Wears glasses   . Pacemaker     dr croitoru-[facility]  . Arthritis   . GERD (gastroesophageal reflux disease)   . Atrial fibrillation   . Hiatal hernia   . Seizures 2009    seizure x 1; none since pacemaker insertion    Past Surgical History  Procedure Laterality Date  . Tonsillectomy    . Hernia repair    . Trigger finger release  04/24/2011    release A-1 pulley left index  . Carpal tunnel release  11/14/2009    right  . Appendectomy    . Abdominal hysterectomy    . Heel spur surgery      both feet  . Eye surgery      both cataracts  . Insert / replace / remove pacemaker  2009  . Shoulder arthroscopy  04/15/2006 - left    right:  10/18/2005, 09/24/2005  . Lumbar laminectomy/decompression microdiscectomy  02/06/2005; 10/21/2003    with fusions   . Back surgery  1610,9604  . Cardioversion  08/08/2010    for atrial fib.  . Trigger finger release  08/10/2003    right thumb  . Wrist arthroscopy  07/04/2010    left wrist; also left CTR  . Spinal growth rods    . Knee arthroscopy      right  . Trigger finger release  01/21/2012    Procedure: RELEASE TRIGGER FINGER/A-1 PULLEY;  Surgeon: Nicki Reaper, MD;  Location: [facility];  Service: Orthopedics;  Laterality: Right;  right ring finger  . I&d extremity  02/05/2012    Procedure: IRRIGATION AND DEBRIDEMENT EXTREMITY;  Surgeon: Nicki Reaper, MD;  Location: [facility];  Service: Orthopedics;  Laterality: Right;  debridement right index and middle fingers  . Trigger finger release  Left 03/17/2013    Procedure: RELEASE TRIGGER FINGER/A-1 PULLEY LEFT MIDDLE, RING AND SMALL FINGERS;  Surgeon: Nicki Reaper, MD;  Location: [facility];  Service: Orthopedics;  Laterality: Left;   History reviewed. No pertinent family history. History   Social History  . Marital Status: Married    Spouse Name: N/A    Number of Children: N/A  . Years of Education: N/A   Occupational History  . Not on file.   Social History Main Topics  . Smoking status: Never Smoker   . Smokeless tobacco: Never Used  . Alcohol Use: No  . Drug Use: No  . Sexual Activity: Not on file   Other Topics Concern  . Not on file   Social History  Narrative  . No narrative on file   Allergies: Oxycodone hcl; Prednisone; Aspirin; Codeine; and Sulfa antibiotics  Home Meds: Prior to Admission medications   Medication Sig Start Date End Date Taking? Authorizing Provider  colchicine 0.6 MG tablet Take 0.6 mg by mouth daily.   Yes Historical Provider, MD  cyclobenzaprine (FLEXERIL) 10 MG tablet Take 10 mg by mouth 3 (three) times daily as needed for muscle spasms.   Yes Historical Provider, MD  diltiazem (CARDIZEM CD) 240 MG 24 hr capsule Take 1 capsule (240 mg total) by mouth daily. PM 05/17/13  Yes Mihai Croitoru, MD  esomeprazole (NEXIUM) 40 MG capsule Take 1 capsule (40 mg total) by mouth daily before breakfast. 03/15/13  Yes Donita Brooks, MD  fish oil-omega-3 fatty acids 1000 MG capsule Take 2 g by mouth daily.   Yes Historical Provider, MD  furosemide (LASIX) 40 MG tablet Take 40 mg by mouth daily as needed for fluid.   Yes Historical Provider, MD  losartan (COZAAR) 50 MG tablet Take 100 mg by mouth daily. AM   Yes Historical Provider, MD  memantine (NAMENDA) 10 MG tablet Take 10 mg by mouth 2 (two) times daily.   Yes Historical Provider, MD  potassium chloride SA (K-DUR,KLOR-CON) 20 MEQ tablet Take 20 mEq by mouth as needed.   Yes Historical Provider, MD  sotalol (BETAPACE) 80 MG tablet Take 80 mg by mouth 2 (two) times daily.   Yes Historical Provider, MD  warfarin (COUMADIN) 1 MG tablet Take 1-2 mg by mouth daily. Coumadin 1mg  on Mon, Wed, Frid, Sat.  2 mg on Tues, Thurs, Sun. 05/24/13  Yes Donita Brooks, MD    Physical Exam: Blood pressure 148/75, pulse 108, temperature 98.6 F (37 C), temperature source Oral, resp. rate 19, SpO2 100.00%. General: Awake, Oriented x3, No acute distress. HEENT: EOMI, Moist mucous membranes Neck: Supple CV: S1 and S2, tachycardic at times Lungs: Clear to ascultation bilaterally Abdomen: Soft, mild generalized tenderness, Nondistended, no guarding, hypoactive bowel sounds. Ext: Good pulses.  Trace edema. No clubbing or cyanosis noted. Neuro: Cranial Nerves II-XII grossly intact. Has 5/5 motor strength in upper and lower extremities.  Lab results:  Recent Labs  09/08/13 1245  NA 143  K 3.6  CL 107  CO2 26  GLUCOSE 114*  BUN 28*  CREATININE 1.61*  CALCIUM 8.9    Recent Labs  09/08/13 1245  AST 17  ALT 10  ALKPHOS 91  BILITOT 0.4  PROT 6.5  ALBUMIN 3.3*   No results found for this basename: LIPASE, AMYLASE,  in the last 72 hours  Recent Labs  09/08/13 1245  WBC 9.5  NEUTROABS 7.8*  HGB 11.6*  HCT 33.6*  MCV 84.8  PLT  206   No results found for this basename: CKTOTAL, CKMB, CKMBINDEX, TROPONINI,  in the last 72 hours No components found with this basename: POCBNP,  No results found for this basename: DDIMER,  in the last 72 hours No results found for this basename: HGBA1C,  in the last 72 hours No results found for this basename: CHOL, HDL, LDLCALC, TRIG, CHOLHDL, LDLDIRECT,  in the last 72 hours No results found for this basename: TSH, T4TOTAL, FREET3, T3FREE, THYROIDAB,  in the last 72 hours No results found for this basename: VITAMINB12, FOLATE, FERRITIN, TIBC, IRON, RETICCTPCT,  in the last 72 hours Imaging results:  Ct Abdomen Pelvis Wo Contrast  09/08/2013   CLINICAL DATA:  Nausea and vomiting  EXAM: CT ABDOMEN AND PELVIS WITHOUT CONTRAST  TECHNIQUE: Multidetector CT imaging of the abdomen and pelvis was performed following the standard protocol without intravenous contrast.  COMPARISON:  Most recent prior CT abdomen/ pelvis 08/04/2010  FINDINGS: Lower Chest: Patchy airspace disease and consolidation in the inferior aspect of the right lower and middle lobes. Mild dependent atelectasis noted in the left lower lobe. There is an associated small right pleural effusion. Mild left atrial enlargement. Incompletely imaged cardiac rhythm enhanced device with leads terminating in the right ventricular apex and right atrium. No pericardial effusion. Ingested  oral contrast material is noted throughout the length of the visualized thoracic esophagus suggesting reflux.  Abdomen: Unenhanced CT was performed per clinician order. Lack of IV contrast limits sensitivity and specificity, especially for evaluation of abdominal/pelvic solid viscera. Stomach is mildly distended with ingested contrast. No acute abnormality. Unremarkable CT appearance of the spleen, adrenal glands and the pancreas. Stable subcentimeter hypoattenuating lesion in hepatic segment 2 dating back to 2011 is highly likely benign. No new acute lesion identified. High attenuation material layering within the gallbladder may reflect sludge and/ or small stones. No intra or extrahepatic biliary ductal dilatation.  Renal cortical atrophy bilaterally. No hydronephrosis or nephrolithiasis. Extrarenal pelvis on the right. Evaluation slightly limited by streak artifact related to posterior lumbar fixation hardware.  Multiple loops of dilated and fluid-filled small bowel with air-fluid levels noted in the left hemiabdomen. The the probable transition point in the left lower quadrant. No free air. Small amount of free fluid in the perisplenic space and anatomic pelvis. Extensive colonic diverticular disease without evidence of active inflammation.  Pelvis: Bladder is distended with urine. Surgical changes of prior hysterectomy. Trace free fluid. Probable bilateral oophorectomy.  Bones/Soft Tissues: No acute fracture or aggressive appearing lytic or blastic osseous lesion. L1-L5 posterior lumbar interbody fusion with bilateral pedicle screw and rod construct. Interbody grafts are noted at each level. The bones appear osteopenic. Detailed evaluation is limited by streak artifact. Degenerative changes noted at T12-L1.  Vascular: Limited evaluation in the absence of intravenous contrast. Atherosclerotic vascular calcifications without aneurysmal dilatation.  IMPRESSION: 1. High-grade small bowel obstruction with a small  amount of associated free fluid but no free air. Probable transition point in the left lower quadrant. Suspect underlying adhesive disease. 2. Patchy infiltrate and consolidation in the dependent right middle and lower lobes concerning for aspiration given the clinical history of small bowel obstruction and emesis. 3. Reflux of oral contrast material throughout the visualized thoracic esophagus. 4. Gallbladder sludge and/ or small stones 5. Colonic diverticular disease without CT evidence of active inflammation. 6. Additional ancillary findings as above. These results were called by telephone at the time of interpretation on 09/08/2013 at 6:33 PM to Dr. Dr. Renae Gloss, who verbally  acknowledged these results.   Electronically Signed   By: Malachy Moan M.D.   On: 09/08/2013 18:34   Other results: EKG: Atrial fibrillation.  Assessment & Plan by Problem: Small bowel obstruction causing abdominal pain, nausea, and vomiting Supportive management with NG for decompression and intermittent suction.  Gentle hydration.  General surgery has been consulted.  Appreciate their input.  Will have the patient n.p.o. except medications.  Check abdominal x-ray in the morning.  Paroxysmal atrial fibrillation status post pacemaker placement Currently the rate is controlled, however has episodes of tachycardia.  Continue home diltiazem.  Continue to monitor patient on telemetry.  Anticoagulated on Coumadin, INR therapeutic.  Aspiration pneumonitis Likely due to nausea and vomiting.  Start empiric ceftriaxone and azithromycin for now.  Depending on patient's clinical course, considered de-escalation of antibiotics if patient remains stable  Hypertension Stable.  Continue home medications.  Hyperlipidemia Stable.  Chronic diastolic dysfunction Stable.  Gentle amount of IV hydration, watch volume status carefully.  Chronic kidney disease stage III Renal function mildly elevated from baseline, likely due to  dehydration from nausea, vomiting, and diarrhea.  Gentle hydration.  Monitor renal function daily.  Prophylaxis Therapeutic INR.  Continue Coumadin as per pharmacy.  CODE STATUS Full code.  This was discussed with patient's family (husband and son) at the time of admission.  Disposition Admit the patient to telemetry as inpatient.  Time spent on admission, talking to the patient, and coordinating care was: 60 mins.  Tyliyah Mcmeekin A, MD 09/08/2013, 8:03 PM

## 2013-09-08 NOTE — ED Notes (Signed)
Pt from home, per husband, began vomiting this AM with loose stools, Emesis reported as dark in color. Pt advised by PCP to come for eval. Pt has hx of dementia. Received 4 zofran IV PTA

## 2013-09-08 NOTE — Telephone Encounter (Signed)
Spoke to pt's husband at 8:30 this AM and he stated that pt has been having some diarrhea, vomiting and chest pain. I informed Mr. Moss that she needed to go to the ER and he stated that her heart MD told them one time before not to go to the ER to come see them first. I told pt that he needed to call them and see if they can work her in and that I would call him back in a about an hour to see what they wanted him to do as I am concerned about her chest pain.   At 10:30 I called pt back and Mr. Irion stated that she is now vomiting what looks like coffee grounds and her diarrhea is not getting any better and her chest now hurts to the point that it is taking her breath and that he did call heart MD and he is waiting on them to call him back. I spoke to MBD about this pt and she is concerned that she may have a GI bleed and suggest strongly to go to the ER. I told Mr. Wootan that we are concerned that she may have a GI bleed that he needs to get her to the ER that if he can not get her in the car to call 911. He stated that it would cost $700 dollars and he was waiting on heart doctor to call back. I told him to forget about heart doctor calling him back and get her to the ER that he could either take her by car or call EMS and i will call back to check on her and if he has not gotten her to ER I will call EMS myself.  Per note in [facility] by Cards office they called spoke to husband. Informed him to go to ER concerning his wife, He states that EMS is there now to transport her @ 11:43.

## 2013-09-08 NOTE — ED Provider Notes (Signed)
Care assumed at the change of shift. CT results and images reviewed. Discussed with Dr. Carolynne Edouard on call for General Surgery who agrees with plan for NG tube and admission to Hospitalist. Spoke with Dr. Betti Cruz who will admit. He agrees with holding off on Abx for now as aspiration does not appear to have cause a pneumonia yet.   Charles B. Bernette Mayers, MD 09/08/13 [phone]

## 2013-09-08 NOTE — Telephone Encounter (Deleted)
Spoke to pt's husband this morning around 8:30 am and he stated that she has been having some diarrhea with vomiting and she is complaining with her chest hurting really bad. I informed Mr. Affinito that pt needed to go to the ER and he refused stating that the heart doctor told him not to take her to the ER

## 2013-09-09 ENCOUNTER — Inpatient Hospital Stay (HOSPITAL_COMMUNITY): Payer: Medicare Other

## 2013-09-09 ENCOUNTER — Encounter (HOSPITAL_COMMUNITY): Payer: Self-pay | Admitting: Cardiology

## 2013-09-09 DIAGNOSIS — I10 Essential (primary) hypertension: Secondary | ICD-10-CM

## 2013-09-09 DIAGNOSIS — Z950 Presence of cardiac pacemaker: Secondary | ICD-10-CM

## 2013-09-09 DIAGNOSIS — R109 Unspecified abdominal pain: Secondary | ICD-10-CM

## 2013-09-09 DIAGNOSIS — F0390 Unspecified dementia without behavioral disturbance: Secondary | ICD-10-CM

## 2013-09-09 DIAGNOSIS — R112 Nausea with vomiting, unspecified: Secondary | ICD-10-CM

## 2013-09-09 DIAGNOSIS — I519 Heart disease, unspecified: Secondary | ICD-10-CM

## 2013-09-09 LAB — CBC
HCT: 29 % — ABNORMAL LOW (ref 36.0–46.0)
Hemoglobin: 9.8 g/dL — ABNORMAL LOW (ref 12.0–15.0)
MCH: 28.8 pg (ref 26.0–34.0)
MCHC: 33.8 g/dL (ref 30.0–36.0)
RBC: 3.4 MIL/uL — ABNORMAL LOW (ref 3.87–5.11)
RDW: 13.7 % (ref 11.5–15.5)

## 2013-09-09 LAB — LEGIONELLA ANTIGEN, URINE: Legionella Antigen, Urine: NEGATIVE

## 2013-09-09 LAB — BASIC METABOLIC PANEL
BUN: 23 mg/dL (ref 6–23)
CO2: 23 mEq/L (ref 19–32)
Calcium: 8.6 mg/dL (ref 8.4–10.5)
GFR calc Af Amer: 46 mL/min — ABNORMAL LOW (ref >90)
GFR calc non Af Amer: 40 mL/min — ABNORMAL LOW (ref >90)
Glucose, Bld: 114 mg/dL — ABNORMAL HIGH (ref 70–99)
Sodium: 141 mEq/L (ref 135–145)

## 2013-09-09 LAB — PROTIME-INR
INR: 2.78 — ABNORMAL HIGH (ref 0.00–1.49)
Prothrombin Time: 28.4 seconds — ABNORMAL HIGH (ref 11.6–15.2)

## 2013-09-09 LAB — STREP PNEUMONIAE URINARY ANTIGEN: Strep Pneumo Urinary Antigen: NEGATIVE

## 2013-09-09 MED ORDER — CHLORHEXIDINE GLUCONATE 0.12 % MT SOLN
15.0000 mL | Freq: Two times a day (BID) | OROMUCOSAL | Status: DC
  Administered 2013-09-09 – 2013-09-14 (×9): 15 mL via OROMUCOSAL
  Filled 2013-09-09 (×6): qty 15

## 2013-09-09 MED ORDER — WARFARIN SODIUM 1 MG PO TABS
1.0000 mg | ORAL_TABLET | ORAL | Status: DC
  Administered 2013-09-10: 1 mg via ORAL
  Filled 2013-09-09 (×2): qty 1

## 2013-09-09 MED ORDER — BIOTENE DRY MOUTH MT LIQD
15.0000 mL | Freq: Two times a day (BID) | OROMUCOSAL | Status: DC
  Administered 2013-09-09 – 2013-09-14 (×7): 15 mL via OROMUCOSAL

## 2013-09-09 MED ORDER — WHITE PETROLATUM GEL
Status: AC
  Administered 2013-09-09: 0.2
  Filled 2013-09-09: qty 5

## 2013-09-09 MED ORDER — WARFARIN SODIUM 2 MG PO TABS
2.0000 mg | ORAL_TABLET | ORAL | Status: DC
  Administered 2013-09-09: 2 mg via ORAL
  Filled 2013-09-09: qty 1

## 2013-09-09 MED ORDER — PIPERACILLIN-TAZOBACTAM 3.375 G IVPB
3.3750 g | Freq: Three times a day (TID) | INTRAVENOUS | Status: DC
  Administered 2013-09-09 – 2013-09-13 (×12): 3.375 g via INTRAVENOUS
  Filled 2013-09-09 (×16): qty 50

## 2013-09-09 MED ORDER — LORAZEPAM 2 MG/ML IJ SOLN
1.0000 mg | Freq: Once | INTRAMUSCULAR | Status: AC
  Administered 2013-09-09: 1 mg via INTRAVENOUS
  Filled 2013-09-09: qty 1

## 2013-09-09 NOTE — Progress Notes (Signed)
Subjective: Pt demented, not following commands 2* confusion.  Husband at bedside notes her pain is improved since the NG was placed.  He says she was having coffee ground emesis and dark stools prior to admission.  He thought she had a stomach bug or her hiatal hernia was acting up because her pain was in her upper abdomen.  She grimaces to pain in the upper abdomen.    Objective: Vital signs in last 24 hours: Temp:  [98.6 F (37 C)-99.7 F (37.6 C)] 99 F (37.2 C) (10/09 0528) Pulse Rate:  [48-108] 102 (10/09 0528) Resp:  [14-29] 18 (10/09 0528) BP: (112-151)/(63-102) 135/90 mmHg (10/09 0528) SpO2:  [89 %-100 %] 90 % (10/09 0528) Weight:  [132 lb 4.4 oz (60 kg)] 132 lb 4.4 oz (60 kg) (10/09 0528)    Intake/Output from previous day: 10/08 0701 - 10/09 0700 In: 938.8 [I.V.:638.8; IV Piggyback:300] Out: 275 [Emesis/NG output:275] Intake/Output this shift:    PE: Gen:  Alert, NAD, pleasant Card:  Afib, tachy, no M/G/R heard Pulm:  CTA, no W/R/R, low effort Abd: Soft, NT/ND, +BS, no HSM, incisions C/D/I, drain with minimal sanguinous drainage, no abdominal scars noted Ext:  Some b/l edema of the LE, scale, good pulses b/l DP  Lab Results:   Recent Labs  09/08/13 1245 09/09/13 0628  WBC 9.5 5.9  HGB 11.6* 9.8*  HCT 33.6* 29.0*  PLT 206 165   BMET  Recent Labs  09/08/13 1245 09/09/13 0628  NA 143 141  K 3.6 3.5  CL 107 108  CO2 26 23  GLUCOSE 114* 114*  BUN 28* 23  CREATININE 1.61* 1.25*  CALCIUM 8.9 8.6   PT/INR  Recent Labs  09/08/13 1336 09/09/13 0628  LABPROT 24.5* 28.4*  INR 2.29* 2.78*   CMP     Component Value Date/Time   NA 141 09/09/2013 0628   K 3.5 09/09/2013 0628   CL 108 09/09/2013 0628   CO2 23 09/09/2013 0628   GLUCOSE 114* 09/09/2013 0628   BUN 23 09/09/2013 0628   CREATININE 1.25* 09/09/2013 0628   CALCIUM 8.6 09/09/2013 0628   PROT 6.5 09/08/2013 1245   ALBUMIN 3.3* 09/08/2013 1245   AST 17 09/08/2013 1245   ALT 10 09/08/2013 1245    ALKPHOS 91 09/08/2013 1245   BILITOT 0.4 09/08/2013 1245   GFRNONAA 40* 09/09/2013 0628   GFRAA 46* 09/09/2013 0628   Lipase     Component Value Date/Time   LIPASE 34 08/04/2010 1331       Studies/Results: Ct Abdomen Pelvis Wo Contrast  09/08/2013   CLINICAL DATA:  Nausea and vomiting  EXAM: CT ABDOMEN AND PELVIS WITHOUT CONTRAST  TECHNIQUE: Multidetector CT imaging of the abdomen and pelvis was performed following the standard protocol without intravenous contrast.  COMPARISON:  Most recent prior CT abdomen/ pelvis 08/04/2010  FINDINGS: Lower Chest: Patchy airspace disease and consolidation in the inferior aspect of the right lower and middle lobes. Mild dependent atelectasis noted in the left lower lobe. There is an associated small right pleural effusion. Mild left atrial enlargement. Incompletely imaged cardiac rhythm enhanced device with leads terminating in the right ventricular apex and right atrium. No pericardial effusion. Ingested oral contrast material is noted throughout the length of the visualized thoracic esophagus suggesting reflux.  Abdomen: Unenhanced CT was performed per clinician order. Lack of IV contrast limits sensitivity and specificity, especially for evaluation of abdominal/pelvic solid viscera. Stomach is mildly distended with ingested contrast. No acute abnormality. Unremarkable CT  appearance of the spleen, adrenal glands and the pancreas. Stable subcentimeter hypoattenuating lesion in hepatic segment 2 dating back to 2011 is highly likely benign. No new acute lesion identified. High attenuation material layering within the gallbladder may reflect sludge and/ or small stones. No intra or extrahepatic biliary ductal dilatation.  Renal cortical atrophy bilaterally. No hydronephrosis or nephrolithiasis. Extrarenal pelvis on the right. Evaluation slightly limited by streak artifact related to posterior lumbar fixation hardware.  Multiple loops of dilated and fluid-filled small bowel  with air-fluid levels noted in the left hemiabdomen. The the probable transition point in the left lower quadrant. No free air. Small amount of free fluid in the perisplenic space and anatomic pelvis. Extensive colonic diverticular disease without evidence of active inflammation.  Pelvis: Bladder is distended with urine. Surgical changes of prior hysterectomy. Trace free fluid. Probable bilateral oophorectomy.  Bones/Soft Tissues: No acute fracture or aggressive appearing lytic or blastic osseous lesion. L1-L5 posterior lumbar interbody fusion with bilateral pedicle screw and rod construct. Interbody grafts are noted at each level. The bones appear osteopenic. Detailed evaluation is limited by streak artifact. Degenerative changes noted at T12-L1.  Vascular: Limited evaluation in the absence of intravenous contrast. Atherosclerotic vascular calcifications without aneurysmal dilatation.  IMPRESSION: 1. High-grade small bowel obstruction with a small amount of associated free fluid but no free air. Probable transition point in the left lower quadrant. Suspect underlying adhesive disease. 2. Patchy infiltrate and consolidation in the dependent right middle and lower lobes concerning for aspiration given the clinical history of small bowel obstruction and emesis. 3. Reflux of oral contrast material throughout the visualized thoracic esophagus. 4. Gallbladder sludge and/ or small stones 5. Colonic diverticular disease without CT evidence of active inflammation. 6. Additional ancillary findings as above. These results were called by telephone at the time of interpretation on 09/08/2013 at 6:33 PM to Dr. Dr. Renae Gloss, who verbally acknowledged these results.   Electronically Signed   By: Malachy Moan M.D.   On: 09/08/2013 18:34   Dg Abd 2 Views  09/09/2013   CLINICAL DATA:  Small bowel obstruction, NG tube  EXAM: ABDOMEN - 2 VIEW  COMPARISON:  CT abdomen pelvis dated 09/08/2013.  FINDINGS: Enteric tube terminates in  the gastric body.  Multiple dilated loops of small bowel in the central abdomen, similar to prior CT, suggesting small bowel obstruction.  No evidence of free air on the lateral decubitus view.  Lumbar spine fixation hardware.  IMPRESSION: Enteric tube terminates in the gastric body.  Multiple dilated loops of small bowel in the central abdomen, similar to prior CT, suggesting small bowel obstruction.   Electronically Signed   By: Charline Bills M.D.   On: 09/09/2013 08:35    Anti-infectives: Anti-infectives   Start     Dose/Rate Route Frequency Ordered Stop   09/08/13 2230  azithromycin (ZITHROMAX) 500 mg in dextrose 5 % 250 mL IVPB     500 mg 250 mL/hr over 60 Minutes Intravenous Every 24 hours 09/08/13 2108 09/15/13 2229   09/08/13 2200  cefTRIAXone (ROCEPHIN) 1 g in dextrose 5 % 50 mL IVPB     1 g 100 mL/hr over 30 Minutes Intravenous Every 24 hours 09/08/13 2108 09/15/13 2159       Assessment/Plan High gradeSBO 2* adhesions (appendectomy, abd hysterectomy), transition AFIB with pacemaker  HTN/HLD Dementia GERD & hiatal hernia H/o seizures 2009 prior to pacemaker 1.  Xray this am shows continued SBO, conservative treatment for now (IVF, pain control, antiemetics, NG  tube, bowel rest) 2.  Will obtain cardiac clearance and help from cards regarding her anticoagulation if surgery is needed 3.  Would give a few days of conservative management prior to proceeding with surgery (given her comorbidities/cardiac risks) unless she acutely worsens and needs and urgent/emergent surgery in which she may need rapid reversal of INR 4.  SCD's and currently on warfarin per pharmacy dosing 5.  Mobilize and IS (pt/ot ordered)    LOS: 1 day    DORT, Marli Diego 09/09/2013, 8:44 AM Pager: [phone]

## 2013-09-09 NOTE — Progress Notes (Signed)
[facility] PROGRESS NOTE  YARELY BEBEE NWG:[id number] DOB: October 25, 1933 DOA: 09/08/2013 PCP: Leo Grosser, MD  Assessment/Plan: 1. Small bowel obstruction. Patient presented with complaints of abdominal pain nausea and vomiting. CT scan of abdomen and pelvis showing high-grade small bowel obstruction with a small amount of associated free fluid but no air. Likely secondary to underlying adhesive disease. Surgery has been consulted, recommending conservative management with IV fluids, NG tube placement, bowel rest. Repeat x-ray showing small bowel obstruction. Will monitor patient's clinical course in the next several days, if no improvement may require surgery. 2. Paroxysmal atrial fibrillation, status post pacemaker implant. Telemetry showing heart rates in the 90s to low 100s. Patient on sotalol 80 mg twice a day and Cardizem 240 mg by mouth q. Daily. 3. Suspected aspiration pneumonitis. Patient presented with multiple episodes of nausea and vomiting with CT scan showing patchy infiltrate and consolidation in the dependent right middle and lower lobes. Will continue empiric antibiotic therapy with Zosyn IV.  4. Hypertension. Blood pressure stable with most recent blood pressure 135/90, continue Cardizem and losartan. 5. Anticoagulation. Patient on Coumadin therapy for atrial fibrillation, pharmacy consulted for Coumadin dosing. 6. Acute renal failure. Improved after receiving IV fluids, likely secondary to prerenal azotemia. Creatinine trended down from 1.61 to 1.25 on this mornings lab work after IV fluids.   Code Status: Full code Family Communication: Plan discussed with patient's husband present at bedside    Consultants:  General surgery   Antibiotics:  Zosyn (09/09/2013)  HPI/Subjective: Patient with history of advanced dementia, unable to provide history or participate in her own plan of care. NG tube is in place. Her husband was present at bedside and states that she  is appearing better today.  Objective: Filed Vitals:   09/09/13 0528  BP: 135/90  Pulse: 102  Temp: 99 F (37.2 C)  Resp: 18    Intake/Output Summary (Last 24 hours) at 09/09/13 1207 Last data filed at 09/09/13 0935  Gross per 24 hour  Intake 938.75 ml  Output    275 ml  Net 663.75 ml   Filed Weights   09/09/13 0528  Weight: 60 kg (132 lb 4.4 oz)    Exam:   General:  Patient appearing comfortable, in no acute distress. She is arousable however with history of advanced dementia, not responding to my questions.  Cardiovascular: Irregular rate and rhythm normal S1-S2 tachycardic  Respiratory: Patient having right sided crackles, normal respiratory effort of  Abdomen: Abdomen appears is soft, did not appreciate significant tenderness to palpation. No distention noted as well.  Musculoskeletal: Present range of motion to all extremity  Extremities: No edema  Data Reviewed: Basic Metabolic Panel:  Recent Labs Lab 09/08/13 1245 09/09/13 0628  NA 143 141  K 3.6 3.5  CL 107 108  CO2 26 23  GLUCOSE 114* 114*  BUN 28* 23  CREATININE 1.61* 1.25*  CALCIUM 8.9 8.6   Liver Function Tests:  Recent Labs Lab 09/08/13 1245  AST 17  ALT 10  ALKPHOS 91  BILITOT 0.4  PROT 6.5  ALBUMIN 3.3*   No results found for this basename: LIPASE, AMYLASE,  in the last 168 hours No results found for this basename: AMMONIA,  in the last 168 hours CBC:  Recent Labs Lab 09/08/13 1245 09/09/13 0628  WBC 9.5 5.9  NEUTROABS 7.8*  --   HGB 11.6* 9.8*  HCT 33.6* 29.0*  MCV 84.8 85.3  PLT 206 165   Cardiac Enzymes: No results found for  this basename: CKTOTAL, CKMB, CKMBINDEX, TROPONINI,  in the last 168 hours BNP (last 3 results) No results found for this basename: PROBNP,  in the last 8760 hours CBG: No results found for this basename: GLUCAP,  in the last 168 hours  No results found for this or any previous visit (from the past 240 hour(s)).   Studies: Ct Abdomen  Pelvis Wo Contrast  09/08/2013   CLINICAL DATA:  Nausea and vomiting  EXAM: CT ABDOMEN AND PELVIS WITHOUT CONTRAST  TECHNIQUE: Multidetector CT imaging of the abdomen and pelvis was performed following the standard protocol without intravenous contrast.  COMPARISON:  Most recent prior CT abdomen/ pelvis 08/04/2010  FINDINGS: Lower Chest: Patchy airspace disease and consolidation in the inferior aspect of the right lower and middle lobes. Mild dependent atelectasis noted in the left lower lobe. There is an associated small right pleural effusion. Mild left atrial enlargement. Incompletely imaged cardiac rhythm enhanced device with leads terminating in the right ventricular apex and right atrium. No pericardial effusion. Ingested oral contrast material is noted throughout the length of the visualized thoracic esophagus suggesting reflux.  Abdomen: Unenhanced CT was performed per clinician order. Lack of IV contrast limits sensitivity and specificity, especially for evaluation of abdominal/pelvic solid viscera. Stomach is mildly distended with ingested contrast. No acute abnormality. Unremarkable CT appearance of the spleen, adrenal glands and the pancreas. Stable subcentimeter hypoattenuating lesion in hepatic segment 2 dating back to 2011 is highly likely benign. No new acute lesion identified. High attenuation material layering within the gallbladder may reflect sludge and/ or small stones. No intra or extrahepatic biliary ductal dilatation.  Renal cortical atrophy bilaterally. No hydronephrosis or nephrolithiasis. Extrarenal pelvis on the right. Evaluation slightly limited by streak artifact related to posterior lumbar fixation hardware.  Multiple loops of dilated and fluid-filled small bowel with air-fluid levels noted in the left hemiabdomen. The the probable transition point in the left lower quadrant. No free air. Small amount of free fluid in the perisplenic space and anatomic pelvis. Extensive colonic  diverticular disease without evidence of active inflammation.  Pelvis: Bladder is distended with urine. Surgical changes of prior hysterectomy. Trace free fluid. Probable bilateral oophorectomy.  Bones/Soft Tissues: No acute fracture or aggressive appearing lytic or blastic osseous lesion. L1-L5 posterior lumbar interbody fusion with bilateral pedicle screw and rod construct. Interbody grafts are noted at each level. The bones appear osteopenic. Detailed evaluation is limited by streak artifact. Degenerative changes noted at T12-L1.  Vascular: Limited evaluation in the absence of intravenous contrast. Atherosclerotic vascular calcifications without aneurysmal dilatation.  IMPRESSION: 1. High-grade small bowel obstruction with a small amount of associated free fluid but no free air. Probable transition point in the left lower quadrant. Suspect underlying adhesive disease. 2. Patchy infiltrate and consolidation in the dependent right middle and lower lobes concerning for aspiration given the clinical history of small bowel obstruction and emesis. 3. Reflux of oral contrast material throughout the visualized thoracic esophagus. 4. Gallbladder sludge and/ or small stones 5. Colonic diverticular disease without CT evidence of active inflammation. 6. Additional ancillary findings as above. These results were called by telephone at the time of interpretation on 09/08/2013 at 6:33 PM to Dr. Dr. Renae Gloss, who verbally acknowledged these results.   Electronically Signed   By: Malachy Moan M.D.   On: 09/08/2013 18:34   Dg Abd 2 Views  09/09/2013   CLINICAL DATA:  Small bowel obstruction, NG tube  EXAM: ABDOMEN - 2 VIEW  COMPARISON:  CT abdomen  pelvis dated 09/08/2013.  FINDINGS: Enteric tube terminates in the gastric body.  Multiple dilated loops of small bowel in the central abdomen, similar to prior CT, suggesting small bowel obstruction.  No evidence of free air on the lateral decubitus view.  Lumbar spine fixation  hardware.  IMPRESSION: Enteric tube terminates in the gastric body.  Multiple dilated loops of small bowel in the central abdomen, similar to prior CT, suggesting small bowel obstruction.   Electronically Signed   By: Charline Bills M.D.   On: 09/09/2013 08:35    Scheduled Meds: . antiseptic oral rinse  15 mL Mouth Rinse q12n4p  . azithromycin  500 mg Intravenous Q24H  . cefTRIAXone (ROCEPHIN)  IV  1 g Intravenous Q24H  . chlorhexidine  15 mL Mouth Rinse BID  . colchicine  0.6 mg Oral Daily  . diltiazem  240 mg Oral QHS  . losartan  100 mg Oral Daily  . memantine  10 mg Oral BID  . omega-3 acid ethyl esters  2 g Oral Daily  . pantoprazole  80 mg Oral Q1200  . sodium chloride  3 mL Intravenous Q12H  . sotalol  80 mg Oral BID  . warfarin  1 mg Oral ONCE-1800  . [START ON 09/10/2013] warfarin  1 mg Oral Custom  . warfarin  2 mg Oral Custom  . Warfarin - Pharmacist Dosing Inpatient   Does not apply q1800   Continuous Infusions: . sodium chloride 75 mL/hr at 09/08/13 2153    Principal Problem:   Small bowel obstruction Active Problems:   Hyperlipidemia   Hypertension   Diverticulosis   Atrial fibrillation, paroxysmal   Pacemaker dual chamber implanted August 2009 for sinus node dysfunction [employer] Enrhythm   Diastolic dysfunction   Abdominal pain   Nausea and vomiting   Diarrhea   CKD (chronic kidney disease), stage III   Dementia    Time spent: 35 minutes    Jeralyn Bennett  [facility] Pager [phone]. If 7PM-7AM, please contact night-coverage at [email or web address], password [facility] 09/09/2013, 12:07 PM  LOS: 1 day

## 2013-09-09 NOTE — Progress Notes (Signed)
Patient arrived to 6N11 via stretcher from ED. Family present. Patient disoriented x3 but alert. Call bell explained to family and oriented to room and surroundings. Patient put on bed alarm and told not to get up without assistance. Contact precautions in place to r/o c.diff. Dr Carolynne Edouard at bedside to see patient.

## 2013-09-09 NOTE — Progress Notes (Signed)
PT Cancellation Note  Patient Details Name: Heather [name] MRN: [id number] DOB: 06-13-1933   Cancelled Treatment:    Reason Eval/Treat Not Completed: Fatigue/lethargy limiting ability to participate;Other (comment) (sitter states pt has hardly been able to awaken throughout the day)   Ferman Hamming 09/09/2013, 2:30 PM Weldon Picking PT Acute Rehab Services [phone] Beeper [phone]

## 2013-09-09 NOTE — Progress Notes (Signed)
Lab only able to obtain one set of blood cultures. Lab will attempt second set in an hour. Waiting to hang the antibiotics until cultures are drawn.

## 2013-09-09 NOTE — Progress Notes (Signed)
OT Cancellation Note  Patient Details Name: ADELYNNE JOERGER MRN: [id number] DOB: [birth year]   Cancelled Treatment:    Reason Eval/Treat Not Completed: Fatigue/lethargy limiting ability to participate. Per sitter, pt has hardly been awake today.  Will re-attempt next date.  09/09/2013 Cipriano Mile OTR/L Pager [phone] Office [phone]

## 2013-09-09 NOTE — Progress Notes (Signed)
Quite sleepy. Abdomen soft, NT at this time. Continue NGT. Patient examined and I agree with the assessment and plan  Violeta Gelinas, MD, MPH, FACS Pager: [phone]  09/09/2013 2:14 PM

## 2013-09-09 NOTE — Progress Notes (Addendum)
ANTICOAGULATION CONSULT NOTE - Follow Up Consult  Pharmacy Consult for Coumadin Indication: atrial fibrillation  Allergies  Allergen Reactions  . Oxycodone Hcl Other (See Comments)    HALLUCINATIONS  . Prednisone Other (See Comments)    Hallucinations  . Aspirin Other (See Comments)    GI UPSET  . Codeine Other (See Comments)    GI UPSET  . Sulfa Antibiotics Other (See Comments)    GI UPSET    Patient Measurements: Weight: 132 lb 4.4 oz (60 kg) Heparin Dosing Weight:    Vital Signs: Temp: 99 F (37.2 C) (10/09 0528) Temp src: Oral (10/09 0528) BP: 135/90 mmHg (10/09 0528) Pulse Rate: 102 (10/09 0528)  Labs:  Recent Labs  09/08/13 1245 09/08/13 1336 09/09/13 0628  HGB 11.6*  --  9.8*  HCT 33.6*  --  29.0*  PLT 206  --  165  LABPROT  --  24.5* 28.4*  INR  --  2.29* 2.78*  CREATININE 1.61*  --  1.25*    The CrCl is unknown because both a height and weight (above a minimum accepted value) are required for this calculation.   Assessment: 79 yo female with hx of afib admitted with high grade SBO with adhesion. Having coffee ground emesis and dark stools prior to admission  Anticoagulation: Afib. INR 2.78 in goal. Hgb 11.6>>9.8. Plts 165. (PTA dose 1mg  MWFSa and 2mg  TTSun with admit INR 2.29)  Infectious Disease: Tmax 99.7 on Rocephin/Azithro.  Cardiovascular: HTN, HLD. 135/90, with HR 102 on diltiazem, losartan, Lovaza, sotalol  Endocrinology: Colchicine  Gastrointestinal / Nutrition: GERD, HH. Plan conservative treatment for now for SBO.  Neurology: Dementia, confusion, h/o seizures (x1 in 2009), chronic pain. Meds: Namenda  Nephrology: Scr 1.25  Hematology / Oncology: Watch drop in Hgb and plts.  PTA Medication Issues  Best Practices: Coumadin, SCD, PPI, MC   Goal of Therapy:  INR 2-3 Monitor platelets by anticoagulation protocol: Yes  Plan:  1) Resume home Coumadin regimen    Crystal S. Merilynn Finland, PharmD, BCPS Clinical Staff  Pharmacist Pager [phone]  Misty Stanley Stillinger 09/09/2013,10:23 AM    =====================================================  Addendum:   - Add Zosyn for aspiration PNA - SCr down 1.25, CrCL 31 ml/min - borderline CrCL for EID of Zosyn - Afebrile, WBC WNL - 10/8 blood culture pending   Goal of Therapy: Clearance of infection    Plan: - Zosyn 3.375gm IV Q8H, 4 hr infusion - BMET in AM     Baylee Mccorkel D. Laney Potash, PharmD, BCPS Pager:  [phone] 09/09/2013, 1:47 PM

## 2013-09-09 NOTE — Consult Note (Signed)
Reason for Consult: Surgical Clearance Referring Physician: General Surgery   HPI: The patient is a 79 y/o female, followed by Dr. Royann Shivers.  She has a history of paroxysmal atrial fibrillation and sinus node dysfunction, status post implantation of a Medtronic dual-chamber permanent pacemaker. She has not required cardioversion therapy since September 2011 but she continues to have numerous episodes of paroxysmal atrial fibrillation despite treatment with sotalol. She is on chronic anticoagulation with Coumadin. Her last 2D echo was 11/11/12 which demonstrated normal systolic function with an EF of 60-65%, with normal wall motion and no regional wall motion abnormalities. She was noted to have grade 2 diastolic dysfunction. The aortic valve showed mild diffuse calcification. There was sclerosis without stenosis. Mild regurgitation was noted. She also had moderate Tricuspid regurgitation. The left atrium was moderately dilated and the right atrium was mildly dilated. The right ventricular systolic pressure was markedly increased, but in the presence of normal right heart size most likely indicated only mild to moderate pulmonary hypertension. Other medical problems include Dementia. Past surgical history is significant for remote appendectomy, hysterectomy and hernia repair.   She presented to [facility] yesterday for evaluation of abdominal pain, diarrhea, n/v. W/u revealed a SBO, felt likely to be secondary to adhesions.  Thus far, the patient is being managed with conservative measures, with a NGT and bowel rest. However, cardiology has been consulted for surgical clearance, in the event that she requires surgery. Her history is limited due to her dementia, however her husband reports that she rarely ever complains of chest pain and she rarely seems short of breath. He states that she is compliant with her prescribed medications. She has not required cardiac catheterization. Her last office visit with Dr.  Royann Shivers was 08/11/13. At that time, she was felt to be stable from a cardiovascular standpoint. At that time, she had full interrogation of her PPM. Active testing of pacing thresholds showed normal function. No permanent adjustments with the device settings. She was at 77% atrial pacing and roughly 11% ventricular pacing. The overall burden of atrial fibrillation was substantially diminished. The exact cause of the increased frequency of atrial fibrillation in the months prior to follow up was uncertain. Over the last year the overall burden of atrial fibrillation had been 21%. When Dr. Royann Shivers saw her in September it was down to less than 10%.    Past Medical History  Diagnosis Date  . Hyperlipidemia   . Chronic LBP   . Gout 01/2012    right index and middle fingers  . Dysrhythmia     atrial fib  . Dementia   . Scoliosis   . Hypertension     under control  . Full dentures   . Wears glasses   . Pacemaker     dr croitoru-[facility]  . Arthritis   . GERD (gastroesophageal reflux disease)   . Atrial fibrillation   . Hiatal hernia   . Seizures 2009    seizure x 1; none since pacemaker insertion    Past Surgical History  Procedure Laterality Date  . Tonsillectomy    . Hernia repair    . Trigger finger release  04/24/2011    release A-1 pulley left index  . Carpal tunnel release  11/14/2009    right  . Appendectomy    . Abdominal hysterectomy    . Heel spur surgery      both feet  . Eye surgery      both cataracts  . Insert /  replace / remove pacemaker  2009  . Shoulder arthroscopy  04/15/2006 - left    right:  10/18/2005, 09/24/2005  . Lumbar laminectomy/decompression microdiscectomy  02/06/2005; 10/21/2003    with fusions   . Back surgery  1610,9604  . Cardioversion  08/08/2010    for atrial fib.  . Trigger finger release  08/10/2003    right thumb  . Wrist arthroscopy  07/04/2010    left wrist; also left CTR  . Spinal growth rods    . Knee arthroscopy      right  . Trigger  finger release  01/21/2012    Procedure: RELEASE TRIGGER FINGER/A-1 PULLEY;  Surgeon: Nicki Reaper, MD;  Location: [facility];  Service: Orthopedics;  Laterality: Right;  right ring finger  . I&d extremity  02/05/2012    Procedure: IRRIGATION AND DEBRIDEMENT EXTREMITY;  Surgeon: Nicki Reaper, MD;  Location: [facility];  Service: Orthopedics;  Laterality: Right;  debridement right index and middle fingers  . Trigger finger release Left 03/17/2013    Procedure: RELEASE TRIGGER FINGER/A-1 PULLEY LEFT MIDDLE, RING AND SMALL FINGERS;  Surgeon: Nicki Reaper, MD;  Location: [facility];  Service: Orthopedics;  Laterality: Left;    Family History  Problem Relation Age of Onset  . Family history unknown: Yes    Social History:  reports that she has never smoked. She has never used smokeless tobacco. She reports that she does not drink alcohol or use illicit drugs.  Allergies:  Allergies  Allergen Reactions  . Oxycodone Hcl Other (See Comments)    HALLUCINATIONS  . Prednisone Other (See Comments)    Hallucinations  . Aspirin Other (See Comments)    GI UPSET  . Codeine Other (See Comments)    GI UPSET  . Sulfa Antibiotics Other (See Comments)    GI UPSET    Medications:  Prior to Admission medications   Medication Sig Start Date End Date Taking? Authorizing Provider  colchicine 0.6 MG tablet Take 0.6 mg by mouth daily.   Yes Historical Provider, MD  cyclobenzaprine (FLEXERIL) 10 MG tablet Take 10 mg by mouth 3 (three) times daily as needed for muscle spasms.   Yes Historical Provider, MD  diltiazem (CARDIZEM CD) 240 MG 24 hr capsule Take 1 capsule (240 mg total) by mouth daily. PM 05/17/13  Yes Mihai Croitoru, MD  esomeprazole (NEXIUM) 40 MG capsule Take 1 capsule (40 mg total) by mouth daily before breakfast. 03/15/13  Yes Donita Brooks, MD  fish oil-omega-3 fatty acids 1000 MG capsule Take 2 g by mouth daily.   Yes Historical Provider, MD    furosemide (LASIX) 40 MG tablet Take 40 mg by mouth daily as needed for fluid.   Yes Historical Provider, MD  losartan (COZAAR) 50 MG tablet Take 100 mg by mouth daily. AM   Yes Historical Provider, MD  memantine (NAMENDA) 10 MG tablet Take 10 mg by mouth 2 (two) times daily.   Yes Historical Provider, MD  potassium chloride SA (K-DUR,KLOR-CON) 20 MEQ tablet Take 20 mEq by mouth as needed.   Yes Historical Provider, MD  sotalol (BETAPACE) 80 MG tablet Take 80 mg by mouth 2 (two) times daily.   Yes Historical Provider, MD  warfarin (COUMADIN) 1 MG tablet Take 1-2 mg by mouth daily. Coumadin 1mg  on Mon, Wed, Frid, Sat.  2 mg on Tues, Thurs, Sun. 05/24/13  Yes Donita Brooks, MD     Results for orders placed during  the hospital encounter of 09/08/13 (from the past 48 hour(s))  CBC WITH DIFFERENTIAL     Status: Abnormal   Collection Time    09/08/13 12:45 PM      Result Value Range   WBC 9.5  4.0 - 10.5 K/uL   RBC 3.96  3.87 - 5.11 MIL/uL   Hemoglobin 11.6 (*) 12.0 - 15.0 g/dL   HCT 96.0 (*) 45.4 - 09.8 %   MCV 84.8  78.0 - 100.0 fL   MCH 29.3  26.0 - 34.0 pg   MCHC 34.5  30.0 - 36.0 g/dL   RDW 11.9  14.7 - 82.9 %   Platelets 206  150 - 400 K/uL   Neutrophils Relative % 83 (*) 43 - 77 %   Neutro Abs 7.8 (*) 1.7 - 7.7 K/uL   Lymphocytes Relative 11 (*) 12 - 46 %   Lymphs Abs 1.0  0.7 - 4.0 K/uL   Monocytes Relative 7  3 - 12 %   Monocytes Absolute 0.6  0.1 - 1.0 K/uL   Eosinophils Relative 0  0 - 5 %   Eosinophils Absolute 0.0  0.0 - 0.7 K/uL   Basophils Relative 0  0 - 1 %   Basophils Absolute 0.0  0.0 - 0.1 K/uL  COMPREHENSIVE METABOLIC PANEL     Status: Abnormal   Collection Time    09/08/13 12:45 PM      Result Value Range   Sodium 143  135 - 145 mEq/L   Potassium 3.6  3.5 - 5.1 mEq/L   Chloride 107  96 - 112 mEq/L   CO2 26  19 - 32 mEq/L   Glucose, Bld 114 (*) 70 - 99 mg/dL   BUN 28 (*) 6 - 23 mg/dL   Creatinine, Ser 5.62 (*) 0.50 - 1.10 mg/dL   Calcium 8.9  8.4 - 13.0  mg/dL   Total Protein 6.5  6.0 - 8.3 g/dL   Albumin 3.3 (*) 3.5 - 5.2 g/dL   AST 17  0 - 37 U/L   ALT 10  0 - 35 U/L   Alkaline Phosphatase 91  39 - 117 U/L   Total Bilirubin 0.4  0.3 - 1.2 mg/dL   GFR calc non Af Amer 30 (*) >90 mL/min   GFR calc Af Amer 34 (*) >90 mL/min   Comment: (NOTE)     The eGFR has been calculated using the CKD EPI equation.     This calculation has not been validated in all clinical situations.     eGFR's persistently <90 mL/min signify possible Chronic Kidney     Disease.  OCCULT BLOOD, POC DEVICE     Status: None   Collection Time    09/08/13 12:54 PM      Result Value Range   Fecal Occult Bld NEGATIVE  NEGATIVE  PROTIME-INR     Status: Abnormal   Collection Time    09/08/13  1:36 PM      Result Value Range   Prothrombin Time 24.5 (*) 11.6 - 15.2 seconds   INR 2.29 (*) 0.00 - 1.49  CG4 I-STAT (LACTIC ACID)     Status: None   Collection Time    09/08/13  2:03 PM      Result Value Range   Lactic Acid, Venous 1.19  0.5 - 2.2 mmol/L  URINALYSIS, ROUTINE W REFLEX MICROSCOPIC     Status: None   Collection Time    09/08/13  4:21 PM  Result Value Range   Color, Urine YELLOW  YELLOW   APPearance CLEAR  CLEAR   Specific Gravity, Urine 1.020  1.005 - 1.030   pH 5.5  5.0 - 8.0   Glucose, UA NEGATIVE  NEGATIVE mg/dL   Hgb urine dipstick NEGATIVE  NEGATIVE   Bilirubin Urine NEGATIVE  NEGATIVE   Ketones, ur NEGATIVE  NEGATIVE mg/dL   Protein, ur NEGATIVE  NEGATIVE mg/dL   Urobilinogen, UA 1.0  0.0 - 1.0 mg/dL   Nitrite NEGATIVE  NEGATIVE   Leukocytes, UA NEGATIVE  NEGATIVE   Comment: MICROSCOPIC NOT DONE ON URINES WITH NEGATIVE PROTEIN, BLOOD, LEUKOCYTES, NITRITE, OR GLUCOSE <1000 mg/dL.  POCT I-STAT TROPONIN I     Status: None   Collection Time    09/08/13  5:12 PM      Result Value Range   Troponin i, poc 0.00  0.00 - 0.08 ng/mL   Comment 3            Comment: Due to the release kinetics of cTnI,     a negative result within the first hours      of the onset of symptoms does not rule out     myocardial infarction with certainty.     If myocardial infarction is still suspected,     repeat the test at appropriate intervals.  STREP PNEUMONIAE URINARY ANTIGEN     Status: None   Collection Time    09/08/13 11:18 PM      Result Value Range   Strep Pneumo Urinary Antigen NEGATIVE  NEGATIVE   Comment:            Infection due to S. pneumoniae     cannot be absolutely ruled out     since the antigen present     may be below the detection limit     of the test.  CBC     Status: Abnormal   Collection Time    09/09/13  6:28 AM      Result Value Range   WBC 5.9  4.0 - 10.5 K/uL   RBC 3.40 (*) 3.87 - 5.11 MIL/uL   Hemoglobin 9.8 (*) 12.0 - 15.0 g/dL   HCT 19.1 (*) 47.8 - 29.5 %   MCV 85.3  78.0 - 100.0 fL   MCH 28.8  26.0 - 34.0 pg   MCHC 33.8  30.0 - 36.0 g/dL   RDW 62.1  30.8 - 65.7 %   Platelets 165  150 - 400 K/uL  BASIC METABOLIC PANEL     Status: Abnormal   Collection Time    09/09/13  6:28 AM      Result Value Range   Sodium 141  135 - 145 mEq/L   Potassium 3.5  3.5 - 5.1 mEq/L   Chloride 108  96 - 112 mEq/L   CO2 23  19 - 32 mEq/L   Glucose, Bld 114 (*) 70 - 99 mg/dL   BUN 23  6 - 23 mg/dL   Creatinine, Ser 8.46 (*) 0.50 - 1.10 mg/dL   Calcium 8.6  8.4 - 96.2 mg/dL   GFR calc non Af Amer 40 (*) >90 mL/min   GFR calc Af Amer 46 (*) >90 mL/min   Comment: (NOTE)     The eGFR has been calculated using the CKD EPI equation.     This calculation has not been validated in all clinical situations.     eGFR's persistently <90 mL/min signify possible Chronic Kidney  Disease.  PROTIME-INR     Status: Abnormal   Collection Time    09/09/13  6:28 AM      Result Value Range   Prothrombin Time 28.4 (*) 11.6 - 15.2 seconds   INR 2.78 (*) 0.00 - 1.49    Ct Abdomen Pelvis Wo Contrast  09/08/2013   CLINICAL DATA:  Nausea and vomiting  EXAM: CT ABDOMEN AND PELVIS WITHOUT CONTRAST  TECHNIQUE: Multidetector CT imaging of the  abdomen and pelvis was performed following the standard protocol without intravenous contrast.  COMPARISON:  Most recent prior CT abdomen/ pelvis 08/04/2010  FINDINGS: Lower Chest: Patchy airspace disease and consolidation in the inferior aspect of the right lower and middle lobes. Mild dependent atelectasis noted in the left lower lobe. There is an associated small right pleural effusion. Mild left atrial enlargement. Incompletely imaged cardiac rhythm enhanced device with leads terminating in the right ventricular apex and right atrium. No pericardial effusion. Ingested oral contrast material is noted throughout the length of the visualized thoracic esophagus suggesting reflux.  Abdomen: Unenhanced CT was performed per clinician order. Lack of IV contrast limits sensitivity and specificity, especially for evaluation of abdominal/pelvic solid viscera. Stomach is mildly distended with ingested contrast. No acute abnormality. Unremarkable CT appearance of the spleen, adrenal glands and the pancreas. Stable subcentimeter hypoattenuating lesion in hepatic segment 2 dating back to 2011 is highly likely benign. No new acute lesion identified. High attenuation material layering within the gallbladder may reflect sludge and/ or small stones. No intra or extrahepatic biliary ductal dilatation.  Renal cortical atrophy bilaterally. No hydronephrosis or nephrolithiasis. Extrarenal pelvis on the right. Evaluation slightly limited by streak artifact related to posterior lumbar fixation hardware.  Multiple loops of dilated and fluid-filled small bowel with air-fluid levels noted in the left hemiabdomen. The the probable transition point in the left lower quadrant. No free air. Small amount of free fluid in the perisplenic space and anatomic pelvis. Extensive colonic diverticular disease without evidence of active inflammation.  Pelvis: Bladder is distended with urine. Surgical changes of prior hysterectomy. Trace free fluid.  Probable bilateral oophorectomy.  Bones/Soft Tissues: No acute fracture or aggressive appearing lytic or blastic osseous lesion. L1-L5 posterior lumbar interbody fusion with bilateral pedicle screw and rod construct. Interbody grafts are noted at each level. The bones appear osteopenic. Detailed evaluation is limited by streak artifact. Degenerative changes noted at T12-L1.  Vascular: Limited evaluation in the absence of intravenous contrast. Atherosclerotic vascular calcifications without aneurysmal dilatation.  IMPRESSION: 1. High-grade small bowel obstruction with a small amount of associated free fluid but no free air. Probable transition point in the left lower quadrant. Suspect underlying adhesive disease. 2. Patchy infiltrate and consolidation in the dependent right middle and lower lobes concerning for aspiration given the clinical history of small bowel obstruction and emesis. 3. Reflux of oral contrast material throughout the visualized thoracic esophagus. 4. Gallbladder sludge and/ or small stones 5. Colonic diverticular disease without CT evidence of active inflammation. 6. Additional ancillary findings as above. These results were called by telephone at the time of interpretation on 09/08/2013 at 6:33 PM to Dr. Dr. Renae Gloss, who verbally acknowledged these results.   Electronically Signed   By: Malachy Moan M.D.   On: 09/08/2013 18:34   Dg Abd 2 Views  09/09/2013   CLINICAL DATA:  Small bowel obstruction, NG tube  EXAM: ABDOMEN - 2 VIEW  COMPARISON:  CT abdomen pelvis dated 09/08/2013.  FINDINGS: Enteric tube terminates in the gastric body.  Multiple dilated loops of small bowel in the central abdomen, similar to prior CT, suggesting small bowel obstruction.  No evidence of free air on the lateral decubitus view.  Lumbar spine fixation hardware.  IMPRESSION: Enteric tube terminates in the gastric body.  Multiple dilated loops of small bowel in the central abdomen, similar to prior CT, suggesting  small bowel obstruction.   Electronically Signed   By: Charline Bills M.D.   On: 09/09/2013 08:35    Review of Systems  Unable to perform ROS: dementia   Blood pressure 135/90, pulse 102, temperature 99 F (37.2 C), temperature source Oral, resp. rate 18, weight 132 lb 4.4 oz (60 kg), SpO2 90.00%. Physical Exam  Constitutional: She appears well-developed and well-nourished. No distress.  Neck: No JVD present.  Cardiovascular: Normal rate and intact distal pulses.  Exam reveals no gallop and no friction rub.   Murmur (soft 1/6 murmur) heard. Respiratory: Effort normal and breath sounds normal. No respiratory distress. She has no wheezes. She has no rales.  GI: Soft. She exhibits distension (mostyl soft/mildly distended). She exhibits no mass. There is no tenderness.  Musculoskeletal: She exhibits no edema.  Neurological: She is alert.  Skin: Skin is warm and dry. She is not diaphoretic.  Psychiatric: She has a normal mood and affect.    Assessment/Plan: Principal Problem:   Small bowel obstruction Active Problems:   Hyperlipidemia   Hypertension   Diverticulosis   Atrial fibrillation, paroxysmal   Pacemaker dual chamber implanted August 2009 for sinus node dysfunction Medtronic Enrhythm   Diastolic dysfunction   Abdominal pain   Nausea and vomiting   Diarrhea   CKD (chronic kidney disease), stage III   Dementia  Plan: 79 y/o female admitted for SBO, likely due to adhesions, secondary to multiple past abdominal surgeries. Plan currently is for conservative management, with NGT and bowel rest, however surgery may be warranted if SBO worsens or does not resolve. From a cardiac standpoint, pt has a h/o PAF, on Sotalol therapy, as well as Cardizem for rate control and Coumadin for stroke prophylaxis. Also has a Medtronic PPM. Last interrogation was 1 month ago, which demonstrated normal functioning, with only a 10% atrial fibrillation burden. Her most recent Echo was 11/2012 which  showed normal systolic function, grade 2 diastolic dysfunction and no significant valvular abnormalities. The patient is currently in A-fib on telemetry with HR fluctuating in the 90s-110s. BP is stable. INR is therapeutic at 2.78. MD to follow with risk assessment.     Allayne Butcher, PA-C  09/09/2013, 11:04 AM     Agree with note written by Boyce Medici  [facility]  [facility] of Dr. Renaye Rakers for cardiology input and clearance in the evnt that she requires exploratory abd surgery for SBO. She has a H/O PAF s/p PTVPM insertion. On Sotalol and diltiazem. Adm with abd pain. Imaging C/W SBO. Has NGT in on bowel rest (i.e not getting PO meds). She has nl LV fxn by 2D. No prior H/O IHD. She apparently is more alert this AM after hydration. Exam benign. Abd soft, but BS hypoactive. Rhythm AFIB with CVR. Agree with conservative approach. OK to have surg from our point of view if necessary. Will follow along with you. Once INR falls below 2 would start IV hep. If HR increases can use IV Dilt.   Runell Gess 09/09/2013 3:18 PM

## 2013-09-10 ENCOUNTER — Inpatient Hospital Stay (HOSPITAL_COMMUNITY): Payer: Medicare Other

## 2013-09-10 LAB — BASIC METABOLIC PANEL
CO2: 25 mEq/L (ref 19–32)
Calcium: 8.5 mg/dL (ref 8.4–10.5)
Chloride: 106 mEq/L (ref 96–112)
Creatinine, Ser: 1.21 mg/dL — ABNORMAL HIGH (ref 0.50–1.10)
GFR calc Af Amer: 48 mL/min — ABNORMAL LOW (ref >90)
Glucose, Bld: 78 mg/dL (ref 70–99)

## 2013-09-10 MED ORDER — BISACODYL 10 MG RE SUPP
10.0000 mg | Freq: Once | RECTAL | Status: AC
  Administered 2013-09-11: 10 mg via RECTAL
  Filled 2013-09-10: qty 1

## 2013-09-10 MED ORDER — BISACODYL 10 MG RE SUPP
10.0000 mg | Freq: Once | RECTAL | Status: AC
  Administered 2013-09-10: 10 mg via RECTAL
  Filled 2013-09-10: qty 1

## 2013-09-10 NOTE — Progress Notes (Signed)
ANTICOAGULATION CONSULT NOTE - Follow Up Consult  Pharmacy Consult for Coumadin Indication: atrial fibrillation  Allergies  Allergen Reactions  . Oxycodone Hcl Other (See Comments)    HALLUCINATIONS  . Prednisone Other (See Comments)    Hallucinations  . Aspirin Other (See Comments)    GI UPSET  . Codeine Other (See Comments)    GI UPSET  . Sulfa Antibiotics Other (See Comments)    GI UPSET    Patient Measurements: Height: 5\' 3"  (160 cm) Weight: 132 lb 4.4 oz (60 kg) IBW/kg (Calculated) : 52.4 Heparin Dosing Weight:    Vital Signs: Temp: 98.4 F (36.9 C) (10/10 0500) Temp src: Oral (10/10 0500) BP: 113/70 mmHg (10/10 0500) Pulse Rate: 72 (10/10 0500)  Labs:  Recent Labs  09/08/13 1245 09/08/13 1336 09/09/13 0628 09/10/13 0720  HGB 11.6*  --  9.8*  --   HCT 33.6*  --  29.0*  --   PLT 206  --  165  --   LABPROT  --  24.5* 28.4* 29.9*  INR  --  2.29* 2.78* 2.98*  CREATININE 1.61*  --  1.25* 1.21*    Estimated Creatinine Clearance: 31.7 ml/min (by C-G formula based on Cr of 1.21).  Assessment: 79 yo female with hx of afib admitted with high grade SBO with adhesion. Having coffee ground emesis and dark stools prior to admission. Continues on coumadin for afib. INR is therapeutic but trending up. No new CBC today. No bleeding noted  Plan: 1. Coumadin 1mg  (50% decrease from yesterday) per home regimen 2. F/u AM INR - if continues to trend up with adjust dose  Lysle Pearl, PharmD, BCPS Pager # [phone] 09/10/2013 8:47 AM

## 2013-09-10 NOTE — Progress Notes (Signed)
Subjective: Pt non communicable this morning.  Son at bedside notes her pain significantly improved.  No N/V.  NG output only 30mL and overall since placed.    Objective: Vital signs in last 24 hours: Temp:  [98.1 F (36.7 C)-99.1 F (37.3 C)] 98.4 F (36.9 C) (10/10 0500) Pulse Rate:  [72-107] 72 (10/10 0500) Resp:  [18-20] 20 (10/10 0500) BP: (113-154)/(70-84) 113/70 mmHg (10/10 0500) SpO2:  [91 %-94 %] 93 % (10/10 0500)    Intake/Output from previous day: 10/09 0701 - 10/10 0700 In: 100 [IV Piggyback:100] Out: 680 [Urine:650; Emesis/NG output:30] Intake/Output this shift: Total I/O In: -  Out: 50 [Emesis/NG output:50]  PE: Gen:  Alert, NAD, pleasant Abd: Soft, NT/ND, +BS, no HSM   Lab Results:   Recent Labs  09/08/13 1245 09/09/13 0628  WBC 9.5 5.9  HGB 11.6* 9.8*  HCT 33.6* 29.0*  PLT 206 165   BMET  Recent Labs  09/09/13 0628 09/10/13 0720  NA 141 142  K 3.5 3.5  CL 108 106  CO2 23 25  GLUCOSE 114* 78  BUN 23 21  CREATININE 1.25* 1.21*  CALCIUM 8.6 8.5   PT/INR  Recent Labs  09/09/13 0628 09/10/13 0720  LABPROT 28.4* 29.9*  INR 2.78* 2.98*   CMP     Component Value Date/Time   NA 142 09/10/2013 0720   K 3.5 09/10/2013 0720   CL 106 09/10/2013 0720   CO2 25 09/10/2013 0720   GLUCOSE 78 09/10/2013 0720   BUN 21 09/10/2013 0720   CREATININE 1.21* 09/10/2013 0720   CALCIUM 8.5 09/10/2013 0720   PROT 6.5 09/08/2013 1245   ALBUMIN 3.3* 09/08/2013 1245   AST 17 09/08/2013 1245   ALT 10 09/08/2013 1245   ALKPHOS 91 09/08/2013 1245   BILITOT 0.4 09/08/2013 1245   GFRNONAA 42* 09/10/2013 0720   GFRAA 48* 09/10/2013 0720   Lipase     Component Value Date/Time   LIPASE 34 08/04/2010 1331       Studies/Results: Ct Abdomen Pelvis Wo Contrast  09/08/2013   CLINICAL DATA:  Nausea and vomiting  EXAM: CT ABDOMEN AND PELVIS WITHOUT CONTRAST  TECHNIQUE: Multidetector CT imaging of the abdomen and pelvis was performed following the  standard protocol without intravenous contrast.  COMPARISON:  Most recent prior CT abdomen/ pelvis 08/04/2010  FINDINGS: Lower Chest: Patchy airspace disease and consolidation in the inferior aspect of the right lower and middle lobes. Mild dependent atelectasis noted in the left lower lobe. There is an associated small right pleural effusion. Mild left atrial enlargement. Incompletely imaged cardiac rhythm enhanced device with leads terminating in the right ventricular apex and right atrium. No pericardial effusion. Ingested oral contrast material is noted throughout the length of the visualized thoracic esophagus suggesting reflux.  Abdomen: Unenhanced CT was performed per clinician order. Lack of IV contrast limits sensitivity and specificity, especially for evaluation of abdominal/pelvic solid viscera. Stomach is mildly distended with ingested contrast. No acute abnormality. Unremarkable CT appearance of the spleen, adrenal glands and the pancreas. Stable subcentimeter hypoattenuating lesion in hepatic segment 2 dating back to 2011 is highly likely benign. No new acute lesion identified. High attenuation material layering within the gallbladder may reflect sludge and/ or small stones. No intra or extrahepatic biliary ductal dilatation.  Renal cortical atrophy bilaterally. No hydronephrosis or nephrolithiasis. Extrarenal pelvis on the right. Evaluation slightly limited by streak artifact related to posterior lumbar fixation hardware.  Multiple loops of dilated and fluid-filled small bowel with  air-fluid levels noted in the left hemiabdomen. The the probable transition point in the left lower quadrant. No free air. Small amount of free fluid in the perisplenic space and anatomic pelvis. Extensive colonic diverticular disease without evidence of active inflammation.  Pelvis: Bladder is distended with urine. Surgical changes of prior hysterectomy. Trace free fluid. Probable bilateral oophorectomy.  Bones/Soft  Tissues: No acute fracture or aggressive appearing lytic or blastic osseous lesion. L1-L5 posterior lumbar interbody fusion with bilateral pedicle screw and rod construct. Interbody grafts are noted at each level. The bones appear osteopenic. Detailed evaluation is limited by streak artifact. Degenerative changes noted at T12-L1.  Vascular: Limited evaluation in the absence of intravenous contrast. Atherosclerotic vascular calcifications without aneurysmal dilatation.  IMPRESSION: 1. High-grade small bowel obstruction with a small amount of associated free fluid but no free air. Probable transition point in the left lower quadrant. Suspect underlying adhesive disease. 2. Patchy infiltrate and consolidation in the dependent right middle and lower lobes concerning for aspiration given the clinical history of small bowel obstruction and emesis. 3. Reflux of oral contrast material throughout the visualized thoracic esophagus. 4. Gallbladder sludge and/ or small stones 5. Colonic diverticular disease without CT evidence of active inflammation. 6. Additional ancillary findings as above. These results were called by telephone at the time of interpretation on 09/08/2013 at 6:33 PM to Dr. Dr. Renae Gloss, who verbally acknowledged these results.   Electronically Signed   By: Malachy Moan M.D.   On: 09/08/2013 18:34   Dg Abd 2 Views  09/09/2013   CLINICAL DATA:  Small bowel obstruction, NG tube  EXAM: ABDOMEN - 2 VIEW  COMPARISON:  CT abdomen pelvis dated 09/08/2013.  FINDINGS: Enteric tube terminates in the gastric body.  Multiple dilated loops of small bowel in the central abdomen, similar to prior CT, suggesting small bowel obstruction.  No evidence of free air on the lateral decubitus view.  Lumbar spine fixation hardware.  IMPRESSION: Enteric tube terminates in the gastric body.  Multiple dilated loops of small bowel in the central abdomen, similar to prior CT, suggesting small bowel obstruction.   Electronically  Signed   By: Charline Bills M.D.   On: 09/09/2013 08:35    Anti-infectives: Anti-infectives   Start     Dose/Rate Route Frequency Ordered Stop   09/09/13 1430  piperacillin-tazobactam (ZOSYN) IVPB 3.375 g     3.375 g 12.5 mL/hr over 240 Minutes Intravenous 3 times per day 09/09/13 1348     09/08/13 2230  azithromycin (ZITHROMAX) 500 mg in dextrose 5 % 250 mL IVPB  Status:  Discontinued     500 mg 250 mL/hr over 60 Minutes Intravenous Every 24 hours 09/08/13 2108 09/09/13 1342   09/08/13 2200  cefTRIAXone (ROCEPHIN) 1 g in dextrose 5 % 50 mL IVPB  Status:  Discontinued     1 g 100 mL/hr over 30 Minutes Intravenous Every 24 hours 09/08/13 2108 09/09/13 1342       Assessment/Plan High grade SBO 2* adhesions (appendectomy, abd hysterectomy) AFIB with pacemaker  HTN/HLD  Dementia  GERD & hiatal hernia  H/o seizures 2009 prior to pacemaker   1. Will repeat KUB this morning, but clinically improved (good BS, had BM yesterday and pain resolved), conservative treatment for now  2. Will try to clamp the tube today to see if she would tolerate it, may be able to d/c tube later today vs tomrorow, SLP eval given nurses noting dysphagia with pills due to mental status, add suppository  3. Cont. conservative management prior to proceeding with surgery (given her comorbidities/cardiac risks) unless she acutely worsens and needs and urgent/emergent surgery in which she may need rapid reversal of INR  4. SCD's and currently on warfarin per pharmacy dosing 5. Mobilize and IS (pt/ot ordered, but unable to participate yesterday 6. Appreciate cards recommendations     LOS: 2 days    Aris Georgia 09/10/2013, 8:42 AM Pager: [phone]

## 2013-09-10 NOTE — Progress Notes (Signed)
Had another BM. Will remove NGT. Abdomen NT. Patient examined and I agree with the assessment and plan  Violeta Gelinas, MD, MPH, FACS Pager: [phone]  09/10/2013 4:03 PM

## 2013-09-10 NOTE — Progress Notes (Signed)
INITIAL NUTRITION ASSESSMENT  DOCUMENTATION CODES Per approved criteria  -Not Applicable   INTERVENTION: Diet advancement per MD discretion Add Resource Breeze BID when diet advanced to clears Provide additional supplements if po intake poor with diet advancement  NUTRITION DIAGNOSIS: Inadequate oral intake related to inability to eat as evidenced by NPO status.   Goal: Pt to meet >/= 90% of their estimated nutrition needs   Monitor:  Diet advancement PO intake Weight  Reason for Assessment: Malnutrition Screening Tool, score of 3  79 y.o. female  Admitting Dx: Small bowel obstruction  ASSESSMENT: Per husband pt has been "eating like a bird" all summer. She usually eats 3 meals daily but, small amounts such as 1/3rd of a sandwich. She also snacks on Oreo cookies throughout the day. Per husband pt usually weighs 138 lbs.   Nutrition Focused Physical Exam:  Subcutaneous Fat:  Orbital Region: wnl Upper Arm Region: mild wasting Thoracic and Lumbar Region: NA  Muscle:  Temple Region: wnl Clavicle Bone Region: wnl Clavicle and Acromion Bone Region: wnl Scapular Bone Region: NA Dorsal Hand: NA (mittens) Patellar Region: wnl Anterior Thigh Region: wnl Posterior Calf Region: wnl  Edema: LLE edema  Height: Ht Readings from Last 1 Encounters:  09/09/13 5\' 3"  (1.6 m)    Weight: Wt Readings from Last 1 Encounters:  09/09/13 132 lb 4.4 oz (60 kg)    Ideal Body Weight: 115 lbs  % Ideal Body Weight: 115%  Wt Readings from Last 10 Encounters:  09/09/13 132 lb 4.4 oz (60 kg)  08/30/13 130 lb (58.968 kg)  08/16/13 130 lb (58.968 kg)  08/11/13 127 lb (57.607 kg)  07/15/13 131 lb (59.421 kg)  03/17/13 138 lb (62.596 kg)  03/17/13 138 lb (62.596 kg)  03/15/13 139 lb (63.05 kg)  02/04/12 135 lb (61.236 kg)  02/04/12 135 lb (61.236 kg)    Usual Body Weight: 138 lbs  % Usual Body Weight: 96%  BMI:  Body mass index is 23.44 kg/(m^2).  Estimated Nutritional  Needs: Kcal: 1400-1600 Protein:60-70 grams Fluid: 1.6-1.8 L/day  Skin: WDL  Diet Order: NPO  EDUCATION NEEDS: -No education needs identified at this time   Intake/Output Summary (Last 24 hours) at 09/10/13 1242 Last data filed at 09/10/13 0925  Gross per 24 hour  Intake    100 ml  Output    730 ml  Net   -630 ml    Last BM: PTA  Labs:   Recent Labs Lab 09/08/13 1245 09/09/13 0628 09/10/13 0720  NA 143 141 142  K 3.6 3.5 3.5  CL 107 108 106  CO2 26 23 25   BUN 28* 23 21  CREATININE 1.61* 1.25* 1.21*  CALCIUM 8.9 8.6 8.5  GLUCOSE 114* 114* 78    CBG (last 3)  No results found for this basename: GLUCAP,  in the last 72 hours  Scheduled Meds: . antiseptic oral rinse  15 mL Mouth Rinse q12n4p  . [START ON 09/11/2013] bisacodyl  10 mg Rectal Once  . chlorhexidine  15 mL Mouth Rinse BID  . colchicine  0.6 mg Oral Daily  . diltiazem  240 mg Oral QHS  . losartan  100 mg Oral Daily  . memantine  10 mg Oral BID  . omega-3 acid ethyl esters  2 g Oral Daily  . pantoprazole  80 mg Oral Q1200  . piperacillin-tazobactam (ZOSYN)  IV  3.375 g Intravenous Q8H  . sodium chloride  3 mL Intravenous Q12H  . sotalol  80 mg  Oral BID  . warfarin  1 mg Oral Custom  . warfarin  2 mg Oral Custom  . Warfarin - Pharmacist Dosing Inpatient   Does not apply q1800    Continuous Infusions:   Past Medical History  Diagnosis Date  . Hyperlipidemia   . Chronic LBP   . Gout 01/2012    right index and middle fingers  . Dysrhythmia     atrial fib  . Dementia   . Scoliosis   . Hypertension     under control  . Full dentures   . Wears glasses   . Pacemaker     dr croitoru-SE[facility]  . Arthritis   . GERD (gastroesophageal reflux disease)   . Atrial fibrillation   . Hiatal hernia   . Seizures 2009    seizure x 1; none since pacemaker insertion    Past Surgical History  Procedure Laterality Date  . Tonsillectomy    . Hernia repair    . Trigger finger release  04/24/2011     release A-1 pulley left index  . Carpal tunnel release  11/14/2009    right  . Appendectomy    . Abdominal hysterectomy    . Heel spur surgery      both feet  . Eye surgery      both cataracts  . Insert / replace / remove pacemaker  2009  . Shoulder arthroscopy  04/15/2006 - left    right:  10/18/2005, 09/24/2005  . Lumbar laminectomy/decompression microdiscectomy  02/06/2005; 10/21/2003    with fusions   . Back surgery  9604,5409  . Cardioversion  08/08/2010    for atrial fib.  . Trigger finger release  08/10/2003    right thumb  . Wrist arthroscopy  07/04/2010    left wrist; also left CTR  . Spinal growth rods    . Knee arthroscopy      right  . Trigger finger release  01/21/2012    Procedure: RELEASE TRIGGER FINGER/A-1 PULLEY;  Surgeon: Nicki Reaper, MD;  Location: [facility];  Service: Orthopedics;  Laterality: Right;  right ring finger  . I&d extremity  02/05/2012    Procedure: IRRIGATION AND DEBRIDEMENT EXTREMITY;  Surgeon: Nicki Reaper, MD;  Location: [facility];  Service: Orthopedics;  Laterality: Right;  debridement right index and middle fingers  . Trigger finger release Left 03/17/2013    Procedure: RELEASE TRIGGER FINGER/A-1 PULLEY LEFT MIDDLE, RING AND SMALL FINGERS;  Surgeon: Nicki Reaper, MD;  Location: [facility];  Service: Orthopedics;  Laterality: Left;    Ian Malkin RD, LDN Inpatient Clinical Dietitian Pager: [phone] After Hours Pager: [phone]

## 2013-09-10 NOTE — Progress Notes (Signed)
Subjective: No complaints. Resting comfortably.   Objective: Vital signs in last 24 hours: Temp:  [98.1 F (36.7 C)-99.1 F (37.3 C)] 98.4 F (36.9 C) (10/10 0500) Pulse Rate:  [72-107] 72 (10/10 0500) Resp:  [18-20] 20 (10/10 0500) BP: (113-154)/(70-84) 113/70 mmHg (10/10 0500) SpO2:  [91 %-94 %] 93 % (10/10 0500)    Intake/Output from previous day: 10/09 0701 - 10/10 0700 In: 100 [IV Piggyback:100] Out: 680 [Urine:650; Emesis/NG output:30] Intake/Output this shift: Total I/O In: -  Out: 50 [Emesis/NG output:50]  Medications Current Facility-Administered Medications  Medication Dose Route Frequency Provider Last Rate Last Dose  . acetaminophen (TYLENOL) tablet 650 mg  650 mg Oral Q6H PRN Cristal Ford, MD   650 mg at 09/09/13 2154   Or  . acetaminophen (TYLENOL) suppository 650 mg  650 mg Rectal Q6H PRN Cristal Ford, MD      . antiseptic oral rinse (BIOTENE) solution 15 mL  15 mL Mouth Rinse q12n4p Jeralyn Bennett, MD   15 mL at 09/09/13 1531  . bisacodyl (DULCOLAX) suppository 10 mg  10 mg Rectal Once Megan Dort, PA-C      . [START ON 09/11/2013] bisacodyl (DULCOLAX) suppository 10 mg  10 mg Rectal Once Megan Dort, PA-C      . chlorhexidine (PERIDEX) 0.12 % solution 15 mL  15 mL Mouth Rinse BID Jeralyn Bennett, MD   15 mL at 09/10/13 0805  . colchicine tablet 0.6 mg  0.6 mg Oral Daily Cristal Ford, MD   0.6 mg at 09/09/13 1002  . cyclobenzaprine (FLEXERIL) tablet 10 mg  10 mg Oral TID PRN Cristal Ford, MD      . diltiazem (CARDIZEM CD) 24 hr capsule 240 mg  240 mg Oral QHS Cristal Ford, MD   240 mg at 09/09/13 2155  . losartan (COZAAR) tablet 100 mg  100 mg Oral Daily Cristal Ford, MD   100 mg at 09/09/13 1002  . memantine (NAMENDA) tablet 10 mg  10 mg Oral BID Cristal Ford, MD   10 mg at 09/09/13 2155  . omega-3 acid ethyl esters (LOVAZA) capsule 2 g  2 g Oral Daily Cristal Ford, MD   2 g at 09/09/13 1003  . ondansetron (ZOFRAN) tablet 4 mg  4 mg Oral  Q6H PRN Cristal Ford, MD       Or  . ondansetron (ZOFRAN) injection 4 mg  4 mg Intravenous Q6H PRN Cristal Ford, MD      . pantoprazole (PROTONIX) EC tablet 80 mg  80 mg Oral Q1200 Cristal Ford, MD      . piperacillin-tazobactam (ZOSYN) IVPB 3.375 g  3.375 g Intravenous Q8H Thuy Dien Dang, RPH   3.375 g at 09/10/13 0556  . sodium chloride 0.9 % injection 3 mL  3 mL Intravenous Q12H Cristal Ford, MD   3 mL at 09/09/13 2156  . sotalol (BETAPACE) tablet 80 mg  80 mg Oral BID Cristal Ford, MD   80 mg at 09/09/13 2155  . warfarin (COUMADIN) tablet 1 mg  1 mg Oral Custom Crystal [place], [facility]      . warfarin (COUMADIN) tablet 2 mg  2 mg Oral Custom Crystal Gratz, RPH   2 mg at 09/09/13 1728  . Warfarin - Pharmacist Dosing Inpatient   Does not apply [id number] Cristal Ford, MD        PE: General appearance: alert, cooperative and no distress  Lungs: clear to auscultation bilaterally Heart: irregularly irregular rhythm Abdomen: soft but slightly distended, hypoactive BS, slight tenderness Extremities: no LEE Pulses: 2+ and symmetric Skin: warm and dry Neurologic: Dementia at baseline  Lab Results:   Recent Labs  09/08/13 1245 09/09/13 0628  WBC 9.5 5.9  HGB 11.6* 9.8*  HCT 33.6* 29.0*  PLT 206 165   BMET  Recent Labs  09/08/13 1245 09/09/13 0628 09/10/13 0720  NA 143 141 142  K 3.6 3.5 3.5  CL 107 108 106  CO2 26 23 25   GLUCOSE 114* 114* 78  BUN 28* 23 21  CREATININE 1.61* 1.25* 1.21*  CALCIUM 8.9 8.6 8.5   PT/INR  Recent Labs  09/08/13 1336 09/09/13 0628 09/10/13 0720  LABPROT 24.5* 28.4* 29.9*  INR 2.29* 2.78* 2.98*    Assessment/Plan  Principal Problem:   Small bowel obstruction Active Problems:   Hyperlipidemia   Hypertension   Diverticulosis   Atrial fibrillation, paroxysmal   Pacemaker dual chamber implanted August 2009 for sinus node dysfunction [employer] Enrhythm   Diastolic dysfunction   Abdominal pain   Nausea  and vomiting   Diarrhea   CKD (chronic kidney disease), stage III   Dementia  Plan: Admitted for SBO. Abdominal plain film yesterday demonstrated multiple dilated loops of small bowel in the central abdomen, similar to prior CT. Repeat abdominal plain film ordered for today to assess signs of improvement. NGT remains in place. The patient was cleared by Dr. Allyson Sabal yesterday to undergo surgery if needed. If surgery is to be elected, may consider holding warfarin and starting IV heparin once INR <2.0. She remains in atrial fibrillation, but ventricular rates are better controlled today in the 80s-90s. BP is stable. We will continue to follow along.     LOS: 2 days    Brittainy M. Sharol Harness, PA-C 09/10/2013 8:46 AM  I have seen and examined the patient along with Brittainy M. Sharol Harness, PA-C.  I have reviewed the chart, notes and new data.  I agree with PA's note.  PLAN: Continue current management if able to take PO meds. IV diltiazem can be used if necessary for AF with RVR.  Thurmon Fair, MD, [facility] [phone] 09/10/2013, 10:31 AM

## 2013-09-10 NOTE — Evaluation (Signed)
Physical Therapy Evaluation Patient Details Name: Heather [name] MRN: [id number] DOB: 14-Jun-1933 Today's Date: 09/10/2013 Time: 2130-8657 PT Time Calculation (min): 22 min  PT Assessment / Plan / Recommendation History of Present Illness  Patient is a 79 y/o female admitted with abdominal pain, diarrhea and vomiting positive for SBO.  PMH positive for hypertension, A. fib on chronic anticoagulation, chronic low back pain, mild dementia, hypertension, status post pacemaker, arthritis, GERD, and scoliosis   Clinical Impression  Patient presents with weakness and increased fall risk with mobility.  Spouse reports multiple falls in the past, but current medical issues likely contributing as well with increased cognitive decline (initially unable to recall name of her spouse.)  Feel she will benefit from skilled PT in the acute setting to maximize independence and safety for return home with family assist.  Likely will be safer with walker, if we can introduce it at this stage of dementia.    PT Assessment  Patient needs continued PT services    Follow Up Recommendations  Home health PT;Supervision/Assistance - 24 hour    Does the patient have the potential to tolerate intense rehabilitation    None  Barriers to Discharge  None      Equipment Recommendations  Rolling walker with 5" wheels    Recommendations for Other Services   None  Frequency Min 3X/week    Precautions / Restrictions Precautions Precautions: Fall Precaution Comments: NG Tube, c-diff precautions   Pertinent Vitals/Pain No current complaints, though spouse reports long history of back issues      Mobility  Bed Mobility Bed Mobility: Sit to Supine;Scooting to HOB Supine to Sit: 4: Min assist;With rails;HOB elevated Sitting - Scoot to Edge of Bed: 4: Min guard;With rail Sit to Supine: 4: Min guard Scooting to HOB: 4: Min assist Details for Bed Mobility Assistance: cues for safe positioning in center of bed and to  scoot to head of bed with bed in trendelenberg Transfers Transfers: Sit to Stand;Stand to Sit Sit to Stand: 4: Min assist;From toilet Stand to Sit: 4: Min assist;To bed Details for Transfer Assistance: assist for safety, pt grabbing items to hold onto with right UE Ambulation/Gait Ambulation/Gait Assistance: 3: Mod assist;4: Min assist;1: +2 Total assist Ambulation/Gait: Patient Percentage: 70% Ambulation Distance (Feet): 140 Feet Assistive device: 1 person hand held assist;2 person hand held assist Ambulation/Gait Assistance Details: grabbing items with right hand (when providing left HHA, then switched to right HHA, and pt better briefly, then reaching for wall with left UE and OT assisted with left HHA.  Seemed fearful of falling and generally weak Gait Pattern: Step-through pattern;Decreased stride length;Wide base of support        PT Diagnosis: Abnormality of gait;Generalized weakness  PT Problem List: Decreased strength;Decreased activity tolerance;Decreased safety awareness;Decreased balance;Decreased mobility;Decreased knowledge of use of DME;Decreased knowledge of precautions PT Treatment Interventions: DME instruction;Balance training;Gait training;Stair training;Functional mobility training;Patient/family education;Therapeutic activities;Therapeutic exercise     PT Goals(Current goals can be found in the care plan section) Acute Rehab PT Goals Patient Stated Goal: none stated PT Goal Formulation: With patient/family Time For Goal Achievement: 09/24/13 Potential to Achieve Goals: Good  Visit Information  Last PT Received On: 09/10/13 Assistance Needed: +1 PT/OT Co-Evaluation/Treatment: Yes History of Present Illness: Patient is a 79 y/o female admitted with abdominal pain, diarrhea and vomiting positive for SBO.  PMH positive for hypertension, A. fib on chronic anticoagulation, chronic low back pain, mild dementia, hypertension, status post pacemaker, arthritis, GERD, and  scoliosis  Prior Functioning  Home Living Family/patient expects to be discharged to:: Private residence Living Arrangements: Spouse/significant other Type of Home: House Home Access: Stairs to enter Secretary/administrator of Steps: 3 Entrance Stairs-Rails: Right Home Layout: One level Home Equipment: Cane - single point Additional Comments: tub shower with curtain and low toilet Prior Function Level of Independence: Independent with assistive device(s) Comments: doesn't cook due to dementia; sons help out as well Communication Communication: No difficulties Dominant Hand: Right    Cognition  Cognition Arousal/Alertness: Awake/alert Behavior During Therapy: WFL for tasks assessed/performed Overall Cognitive Status: History of cognitive impairments - at baseline    Extremity/Trunk Assessment Upper Extremity Assessment Upper Extremity Assessment: Overall WFL for tasks assessed Lower Extremity Assessment Lower Extremity Assessment: Generalized weakness Cervical / Trunk Assessment Cervical / Trunk Assessment: Normal   Balance Balance Balance Assessed: Yes Static Standing Balance Static Standing - Balance Support: During functional activity;No upper extremity supported Static Standing - Level of Assistance: 5: Stand by assistance Static Standing - Comment/# of Minutes: pt leaning up against sink to wash hands  End of Session PT - End of Session Equipment Utilized During Treatment: Gait belt Activity Tolerance: Patient limited by fatigue Patient left: in bed;with call bell/phone within reach;with family/visitor present;with nursing/sitter in room  GP     [facility] 09/10/2013, 4:05 PM [place], [place] [phone] 09/10/2013

## 2013-09-10 NOTE — Evaluation (Signed)
Clinical/Bedside Swallow Evaluation Patient Details  Name: Heather [name] MRN: [id number] Date of Birth: March 26, 1933  Today's Date: 09/10/2013 Time: 4098-1191 SLP Time Calculation (min): 20 min  Past Medical History:  Past Medical History  Diagnosis Date  . Hyperlipidemia   . Chronic LBP   . Gout 01/2012    right index and middle fingers  . Dysrhythmia     atrial fib  . Dementia   . Scoliosis   . Hypertension     under control  . Full dentures   . Wears glasses   . Pacemaker     dr croitoru-[facility]  . Arthritis   . GERD (gastroesophageal reflux disease)   . Atrial fibrillation   . Hiatal hernia   . Seizures 2009    seizure x 1; none since pacemaker insertion   Past Surgical History:  Past Surgical History  Procedure Laterality Date  . Tonsillectomy    . Hernia repair    . Trigger finger release  04/24/2011    release A-1 pulley left index  . Carpal tunnel release  11/14/2009    right  . Appendectomy    . Abdominal hysterectomy    . Heel spur surgery      both feet  . Eye surgery      both cataracts  . Insert / replace / remove pacemaker  2009  . Shoulder arthroscopy  04/15/2006 - left    right:  10/18/2005, 09/24/2005  . Lumbar laminectomy/decompression microdiscectomy  02/06/2005; 10/21/2003    with fusions   . Back surgery  [id number]  . Cardioversion  08/08/2010    for atrial fib.  . Trigger finger release  08/10/2003    right thumb  . Wrist arthroscopy  07/04/2010    left wrist; also left CTR  . Spinal growth rods    . Knee arthroscopy      right  . Trigger finger release  01/21/2012    Procedure: RELEASE TRIGGER FINGER/A-1 PULLEY;  Surgeon: Nicki Reaper, MD;  Location: [facility];  Service: Orthopedics;  Laterality: Right;  right ring finger  . I&d extremity  02/05/2012    Procedure: IRRIGATION AND DEBRIDEMENT EXTREMITY;  Surgeon: Nicki Reaper, MD;  Location: [facility];  Service: Orthopedics;  Laterality: Right;  debridement right index  and middle fingers  . Trigger finger release Left 03/17/2013    Procedure: RELEASE TRIGGER FINGER/A-1 PULLEY LEFT MIDDLE, RING AND SMALL FINGERS;  Surgeon: Nicki Reaper, MD;  Location: [facility];  Service: Orthopedics;  Laterality: Left;   HPI:  79 y.o. Caucasian female with history of hypertension, A. fib on chronic anticoagulation, chronic low back pain, mild dementia, hypertension, status post pacemaker, arthritis, GERD, and scoliosis who presents with the above complaints.  Patient symptoms started yesterday with abdominal pain.  She started having diarrhea and then started vomiting. Husband noted that both her stools and emesis were coffee-ground in color. He contacted her primary care physician who instructed the patient come in to the emergency department for further evaluation. In the emergency department patient had imaging which showed high-grade small bowel obstruction. Imaging also showed patchy infiltrate and consolidation in the dependent right middle and lower lobes concerning for aspiration.  CT abdomen of pelvis commented on lungs which showed patchy infiltrate and consolidation in the dependent right middle and lower lobes concerning for aspiration given the clinical history of small bowel obstruction and emesis.  Husband reports decreased liquid intake due to "bad taste".  He reports she also complains of burning sensation after eating at times.   Assessment / Plan / Recommendation Clinical Impression  Swallow assessment with large bore NGT in and clamped by RN.  PA-C reports NGT may be able to be removed if she tolerating clamping today.  No indications of aspiration.  Periods of decreased alertness but overall endurance appears functional and suspect she is able to protect airway and that epiglottis is able to clear the tube.  Swallow safety will be increased once tube is removed, however spoke with PA-C that she appears appopriate to initiate clear liquids as planned.   Reviewed reflux precautions with husband to decrease risk of aspirating reflux.  No follow up needed and medical staff can upgrade diet as appropriate. Thank you.    Aspiration Risk  Moderate (mild with tube removed)    Diet Recommendation Thin liquid (clear liquids per MD)   Liquid Administration via: Cup;Straw Medication Administration: Whole meds with puree (crush if large) Supervision: Intermittent supervision to cue for compensatory strategies Compensations: Slow rate;Small sips/bites Postural Changes and/or Swallow Maneuvers: Seated upright 90 degrees;Upright 30-60 min after meal    Other  Recommendations Oral Care Recommendations: Oral care BID   Follow Up Recommendations  None    Frequency and Duration        Pertinent Vitals/Pain none         Swallow Study         Oral/Motor/Sensory Function Overall Oral Motor/Sensory Function: Appears within functional limits for tasks assessed   Ice Chips Ice chips: Not tested   Thin Liquid Thin Liquid: Within functional limits    Nectar Thick Nectar Thick Liquid: Not tested   Honey Thick Honey Thick Liquid: Not tested   Puree Puree: Not tested   Solid   GO    Solid: Not tested       Royce Macadamia M.Ed [employer] [phone]  09/10/2013

## 2013-09-10 NOTE — Progress Notes (Addendum)
[facility] PROGRESS NOTE  CHRISA HASSAN ZOX:[id number] DOB: December 13, 1932 DOA: 09/08/2013 PCP: Leo Grosser, MD  Assessment/Plan: 1. Small bowel obstruction. Patient seems improved today, abdominal exam showing nondistended abdomen with mild generalized tenderness to palpation. Patient is passing flatus. Abdominal x-ray showed a slight interval increase in market gaseous distention of multiple loops of small bowel. Surgery following, will pursue conservative management. Plan to clamp the NG tube today, trial of clears, consider discontinuing NG tube later today if patient continues to do well. 2. Paroxysmal atrial fibrillation, status post pacemaker implant. Continue calcium channel blocker therapy and sotalol.   3. Suspected aspiration pneumonitis. Patient presented with multiple episodes of nausea and vomiting. Imaging studies revealed right-sided infiltrate. Continue empiric antibiotic therapy with IV Zosyn.  4. Hypertension. Blood pressure stable with most recent blood pressure 113/70, continue Cardizem and losartan. 5. Anticoagulation. Patient with history of Afib. INR at 2.98, pharmacy consulted. 6. Acute renal failure. Improved after receiving IV fluids, likely secondary to prerenal azotemia. Creatinine is stable at 1.21 with BUN of 21..   Code Status: Full code Family Communication: Plan discussed with patient's husband present at bedside    Consultants:  General surgery   Antibiotics:  Zosyn (09/09/2013)  HPI/Subjective: Patient is awake alert, did answer a few of my questions. She has a history of advanced dementia.  Objective: Filed Vitals:   09/10/13 0500  BP: 113/70  Pulse: 72  Temp: 98.4 F (36.9 C)  Resp: 20    Intake/Output Summary (Last 24 hours) at 09/10/13 1037 Last data filed at 09/10/13 0925  Gross per 24 hour  Intake    100 ml  Output    730 ml  Net   -630 ml   Filed Weights   09/09/13 0528  Weight: 60 kg (132 lb 4.4 oz)     Exam:   General:  Patient appearing comfortable, in no acute distress. She is arousable however with history of advanced dementia, not responding to my questions.  Cardiovascular: Irregular rate and rhythm normal S1-S2 tachycardic  Respiratory: Patient having right sided crackles, normal respiratory effort of  Abdomen: Abdomen appears is soft, did not appreciate significant tenderness to palpation. No distention noted as well.  Musculoskeletal: Present range of motion to all extremity  Extremities: No edema  Data Reviewed: Basic Metabolic Panel:  Recent Labs Lab 09/08/13 1245 09/09/13 0628 09/10/13 0720  NA 143 141 142  K 3.6 3.5 3.5  CL 107 108 106  CO2 26 23 25   GLUCOSE 114* 114* 78  BUN 28* 23 21  CREATININE 1.61* 1.25* 1.21*  CALCIUM 8.9 8.6 8.5   Liver Function Tests:  Recent Labs Lab 09/08/13 1245  AST 17  ALT 10  ALKPHOS 91  BILITOT 0.4  PROT 6.5  ALBUMIN 3.3*   No results found for this basename: LIPASE, AMYLASE,  in the last 168 hours No results found for this basename: AMMONIA,  in the last 168 hours CBC:  Recent Labs Lab 09/08/13 1245 09/09/13 0628  WBC 9.5 5.9  NEUTROABS 7.8*  --   HGB 11.6* 9.8*  HCT 33.6* 29.0*  MCV 84.8 85.3  PLT 206 165   Cardiac Enzymes: No results found for this basename: CKTOTAL, CKMB, CKMBINDEX, TROPONINI,  in the last 168 hours BNP (last 3 results) No results found for this basename: PROBNP,  in the last 8760 hours CBG: No results found for this basename: GLUCAP,  in the last 168 hours  Recent Results (from the past 240 hour(s))  CULTURE, BLOOD (ROUTINE X 2)     Status: None   Collection Time    09/08/13 11:15 PM      Result Value Range Status   Specimen Description BLOOD RIGHT ARM   Final   Special Requests BOTTLES DRAWN AEROBIC ONLY 6CC   Final   Culture  Setup Time     Final   Value: 09/09/2013 03:39     Performed at [facility]   Culture     Final   Value:        BLOOD CULTURE  RECEIVED NO GROWTH TO DATE CULTURE WILL BE HELD FOR 5 DAYS BEFORE ISSUING A FINAL NEGATIVE REPORT     Performed at [facility]   Report Status PENDING   Incomplete  CULTURE, BLOOD (ROUTINE X 2)     Status: None   Collection Time    09/09/13  1:05 AM      Result Value Range Status   Specimen Description BLOOD RIGHT ARM   Final   Special Requests BOTTLES DRAWN AEROBIC AND ANAEROBIC 10CC EACH   Final   Culture  Setup Time     Final   Value: 09/09/2013 10:28     Performed at [facility]   Culture     Final   Value:        BLOOD CULTURE RECEIVED NO GROWTH TO DATE CULTURE WILL BE HELD FOR 5 DAYS BEFORE ISSUING A FINAL NEGATIVE REPORT     Performed at [facility]   Report Status PENDING   Incomplete     Studies: Ct Abdomen Pelvis Wo Contrast  09/08/2013   CLINICAL DATA:  Nausea and vomiting  EXAM: CT ABDOMEN AND PELVIS WITHOUT CONTRAST  TECHNIQUE: Multidetector CT imaging of the abdomen and pelvis was performed following the standard protocol without intravenous contrast.  COMPARISON:  Most recent prior CT abdomen/ pelvis 08/04/2010  FINDINGS: Lower Chest: Patchy airspace disease and consolidation in the inferior aspect of the right lower and middle lobes. Mild dependent atelectasis noted in the left lower lobe. There is an associated small right pleural effusion. Mild left atrial enlargement. Incompletely imaged cardiac rhythm enhanced device with leads terminating in the right ventricular apex and right atrium. No pericardial effusion. Ingested oral contrast material is noted throughout the length of the visualized thoracic esophagus suggesting reflux.  Abdomen: Unenhanced CT was performed per clinician order. Lack of IV contrast limits sensitivity and specificity, especially for evaluation of abdominal/pelvic solid viscera. Stomach is mildly distended with ingested contrast. No acute abnormality. Unremarkable CT appearance of the spleen, adrenal glands and the pancreas.  Stable subcentimeter hypoattenuating lesion in hepatic segment 2 dating back to 2011 is highly likely benign. No new acute lesion identified. High attenuation material layering within the gallbladder may reflect sludge and/ or small stones. No intra or extrahepatic biliary ductal dilatation.  Renal cortical atrophy bilaterally. No hydronephrosis or nephrolithiasis. Extrarenal pelvis on the right. Evaluation slightly limited by streak artifact related to posterior lumbar fixation hardware.  Multiple loops of dilated and fluid-filled small bowel with air-fluid levels noted in the left hemiabdomen. The the probable transition point in the left lower quadrant. No free air. Small amount of free fluid in the perisplenic space and anatomic pelvis. Extensive colonic diverticular disease without evidence of active inflammation.  Pelvis: Bladder is distended with urine. Surgical changes of prior hysterectomy. Trace free fluid. Probable bilateral oophorectomy.  Bones/Soft Tissues: No acute fracture or aggressive appearing lytic or blastic osseous lesion.  L1-L5 posterior lumbar interbody fusion with bilateral pedicle screw and rod construct. Interbody grafts are noted at each level. The bones appear osteopenic. Detailed evaluation is limited by streak artifact. Degenerative changes noted at T12-L1.  Vascular: Limited evaluation in the absence of intravenous contrast. Atherosclerotic vascular calcifications without aneurysmal dilatation.  IMPRESSION: 1. High-grade small bowel obstruction with a small amount of associated free fluid but no free air. Probable transition point in the left lower quadrant. Suspect underlying adhesive disease. 2. Patchy infiltrate and consolidation in the dependent right middle and lower lobes concerning for aspiration given the clinical history of small bowel obstruction and emesis. 3. Reflux of oral contrast material throughout the visualized thoracic esophagus. 4. Gallbladder sludge and/ or small  stones 5. Colonic diverticular disease without CT evidence of active inflammation. 6. Additional ancillary findings as above. These results were called by telephone at the time of interpretation on 09/08/2013 at 6:33 PM to Dr. Dr. Renae Gloss, who verbally acknowledged these results.   Electronically Signed   By: Malachy Moan M.D.   On: 09/08/2013 18:34   Dg Abd 2 Views  09/09/2013   CLINICAL DATA:  Small bowel obstruction, NG tube  EXAM: ABDOMEN - 2 VIEW  COMPARISON:  CT abdomen pelvis dated 09/08/2013.  FINDINGS: Enteric tube terminates in the gastric body.  Multiple dilated loops of small bowel in the central abdomen, similar to prior CT, suggesting small bowel obstruction.  No evidence of free air on the lateral decubitus view.  Lumbar spine fixation hardware.  IMPRESSION: Enteric tube terminates in the gastric body.  Multiple dilated loops of small bowel in the central abdomen, similar to prior CT, suggesting small bowel obstruction.   Electronically Signed   By: Charline Bills M.D.   On: 09/09/2013 08:35   Dg Abd Portable 2v  09/10/2013   CLINICAL DATA:  Vomiting ; small bowel obstruction.  EXAM: PORTABLE ABDOMEN - 2 VIEW  COMPARISON:  Abdominal radiograph 09/09/2013  FINDINGS: Slight interval increase in gaseous distention of multiple loops of small bowel within the abdomen measuring up to 5.6 cm. Residual contrast material is demonstrated within the ascending colon. The NG tube is present with tip and side-port coursing towards the gastric antrum/ proximal duodenum. Decubitus view demonstrates no definite free intraperitoneal air. Lumbar spinal fusion hardware re- demonstrated.  IMPRESSION: 1. Slight interval increase in marked gaseous distention of multiple loops of small bowel compatible with small bowel obstruction. 2. No definite evidence for free intraperitoneal air. 3. NG tube has been advanced with the tip and side port projecting towards the right upper quadrant, likely within the gastric  antrum/ proximal duodenum. These results will be called to the ordering clinician or representative by the Radiologist Assistant, and communication documented in the PACS Dashboard.   Electronically Signed   By: Annia Belt M.D.   On: 09/10/2013 09:12    Scheduled Meds: . antiseptic oral rinse  15 mL Mouth Rinse q12n4p  . [START ON 09/11/2013] bisacodyl  10 mg Rectal Once  . chlorhexidine  15 mL Mouth Rinse BID  . colchicine  0.6 mg Oral Daily  . diltiazem  240 mg Oral QHS  . losartan  100 mg Oral Daily  . memantine  10 mg Oral BID  . omega-3 acid ethyl esters  2 g Oral Daily  . pantoprazole  80 mg Oral Q1200  . piperacillin-tazobactam (ZOSYN)  IV  3.375 g Intravenous Q8H  . sodium chloride  3 mL Intravenous Q12H  . sotalol  80  mg Oral BID  . warfarin  1 mg Oral Custom  . warfarin  2 mg Oral Custom  . Warfarin - Pharmacist Dosing Inpatient   Does not apply q1800   Continuous Infusions:    Principal Problem:   Small bowel obstruction Active Problems:   Hyperlipidemia   Hypertension   Diverticulosis   Atrial fibrillation, paroxysmal   Pacemaker dual chamber implanted August 2009 for sinus node dysfunction [employer] Enrhythm   Diastolic dysfunction   Abdominal pain   Nausea and vomiting   Diarrhea   CKD (chronic kidney disease), stage III   Dementia    Time spent: 35 minutes    Heather [name]  [facility] Pager [phone]. If 7PM-7AM, please contact night-coverage at [email or web address], password [facility] 09/10/2013, 10:37 AM  LOS: 2 days

## 2013-09-10 NOTE — Evaluation (Signed)
Occupational Therapy Evaluation Patient Details Name: Heather [name] MRN: [id number] DOB: 19-Feb-1933 Today's Date: 09/10/2013 Time: 4098-1191 OT Time Calculation (min): 35 min  OT Assessment / Plan / Recommendation History of present illness Patient is a 79 y/o female admitted with abdominal pain, diarrhea and vomiting positive for SBO.  PMH positive for hypertension, A. fib on chronic anticoagulation, chronic low back pain, mild dementia, hypertension, status post pacemaker, arthritis, GERD, and scoliosis    Clinical Impression   PT admitted with SBO. Pt currently with functional limitiations due to the deficits listed below (see OT problem list).  Pt will benefit from skilled OT to increase their independence and safety with adls and balance to allow discharge SNF. Pt currently with balance deficits that affect all adls. PTA pt was able to complete ambulation and adls without (A). Pt will need to be MOD I to d/c home.     OT Assessment  Patient needs continued OT Services    Follow Up Recommendations  SNF;Supervision/Assistance - 24 hour (possible d/c home if incr balance)    Barriers to Discharge      Equipment Recommendations  None recommended by OT    Recommendations for Other Services    Frequency  Min 2X/week    Precautions / Restrictions Precautions Precautions: Fall Precaution Comments: NG Tube, c-diff precautions   Pertinent Vitals/Pain No pain reported    ADL  Eating/Feeding: NPO Grooming: Wash/dry hands;Minimal assistance Where Assessed - Grooming: Supported Copywriter, advertising: Moderate assistance Toilet Transfer Method: Sit to stand Toilet Transfer Equipment: Regular height toilet;Grab bars Toileting - Clothing Manipulation and Hygiene: +1 Total assistance Where Assessed - Toileting Clothing Manipulation and Hygiene: Sit to stand from 3-in-1 or toilet Equipment Used: Gait belt (hand held (A) on right side) Transfers/Ambulation Related to ADLs: Pt  ambulated with (A) using rt ue support. Pt reaching for unsafe environmental supports. Pt needed cues for safety ADL Comments: pt supine on arrival. pt pleasant and agreeable to oob. Pt ambulated to bathroom and void small muscus stool. Pt standing at sink to wash hands. Pt provided mesh panties for ambulation and prevent void of bowel or bladder. Pt currently able to pass gass.    OT Diagnosis: Generalized weakness  OT Problem List: Decreased strength;Decreased activity tolerance;Impaired balance (sitting and/or standing);Decreased safety awareness;Decreased knowledge of use of DME or AE;Decreased knowledge of precautions;Pain OT Treatment Interventions: Self-care/ADL training;Therapeutic exercise;DME and/or AE instruction;Therapeutic activities;Patient/family education;Balance training   OT Goals(Current goals can be found in the care plan section) Acute Rehab OT Goals Patient Stated Goal: none stated OT Goal Formulation: With family Time For Goal Achievement: 09/24/13 Potential to Achieve Goals: Good  Visit Information  Last OT Received On: 09/10/13 Assistance Needed: +1 History of Present Illness: Patient is a 79 y/o female admitted with abdominal pain, diarrhea and vomiting positive for SBO.  PMH positive for hypertension, A. fib on chronic anticoagulation, chronic low back pain, mild dementia, hypertension, status post pacemaker, arthritis, GERD, and scoliosis        Prior Functioning     Home Living Family/patient expects to be discharged to:: Private residence Living Arrangements: Spouse/significant other Type of Home: House Home Access: Stairs to enter Secretary/administrator of Steps: 3 Entrance Stairs-Rails: Right Home Layout: One level Home Equipment: Cane - single point Additional Comments: tub shower with curtain and low toilet Prior Function Level of Independence: Independent with assistive device(s) Comments: doesn't cook due to dementia; sons help out as  well Communication Communication: No difficulties Dominant Hand:  Right         Vision/Perception Vision - Assessment Vision Assessment: Vision not tested   Cognition  Cognition Arousal/Alertness: Awake/alert Behavior During Therapy: WFL for tasks assessed/performed Overall Cognitive Status: History of cognitive impairments - at baseline    Extremity/Trunk Assessment Upper Extremity Assessment Upper Extremity Assessment: Overall WFL for tasks assessed Lower Extremity Assessment Lower Extremity Assessment: Generalized weakness Cervical / Trunk Assessment Cervical / Trunk Assessment: Normal     Mobility Bed Mobility Bed Mobility: Sit to Supine;Scooting to HOB Supine to Sit: 4: Min assist;With rails;HOB elevated Sitting - Scoot to Edge of Bed: 4: Min guard;With rail Sit to Supine: 4: Min guard Scooting to HOB: 4: Min assist Details for Bed Mobility Assistance: cues for safe positioning in center of bed and to scoot to head of bed with bed in trendelenberg Transfers Transfers: Sit to Stand;Stand to Sit Sit to Stand: 4: Min assist;From toilet Stand to Sit: 4: Min assist;To bed Details for Transfer Assistance: assist for safety, pt grabbing items to hold onto with right UE     Exercise     Balance Balance Balance Assessed: Yes Static Standing Balance Static Standing - Balance Support: During functional activity;No upper extremity supported Static Standing - Level of Assistance: 5: Stand by assistance Static Standing - Comment/# of Minutes: pt leaning up against sink to wash hands   End of Session OT - End of Session Activity Tolerance: Patient tolerated treatment well Patient left: in bed;with call bell/phone within reach;with bed alarm set Nurse Communication: Mobility status;Precautions  GO     Harolyn Rutherford 09/10/2013, 4:07 PM Pager: [phone]

## 2013-09-11 ENCOUNTER — Inpatient Hospital Stay (HOSPITAL_COMMUNITY): Payer: Medicare Other

## 2013-09-11 LAB — PROTIME-INR: INR: 4.36 — ABNORMAL HIGH (ref 0.00–1.49)

## 2013-09-11 MED ORDER — SODIUM CHLORIDE 0.9 % IV SOLN
INTRAVENOUS | Status: DC
  Administered 2013-09-11 – 2013-09-15 (×6): via INTRAVENOUS

## 2013-09-11 MED ORDER — ALUM & MAG HYDROXIDE-SIMETH 200-200-20 MG/5ML PO SUSP
30.0000 mL | ORAL | Status: DC | PRN
  Administered 2013-09-11 – 2013-09-13 (×3): 30 mL via ORAL
  Filled 2013-09-11 (×4): qty 30

## 2013-09-11 NOTE — Progress Notes (Signed)
Patient ID: Heather [name], female   DOB: 20-Jul-1933, 79 y.o.   MRN: [id number]    Subjective: Pt not feeling well today.  C/o a lot of belching.  Pain in epigastrium this morning, husband thinks from hiatal hernia.  Patient unable to fully explain why she doesn't feel good, but states that her belly doesn't feel well.  Objective: Vital signs in last 24 hours: Temp:  [97.7 F (36.5 C)-98.6 F (37 C)] 97.7 F (36.5 C) (10/11 0610) Pulse Rate:  [68-79] 68 (10/11 0610) Resp:  [16-20] 20 (10/11 0610) BP: (98-129)/(61-64) 98/61 mmHg (10/11 0610) SpO2:  [93 %-94 %] 94 % (10/11 0610)    Intake/Output from previous day: 10/10 0701 - 10/11 0700 In: 650 [P.O.:500; IV Piggyback:150] Out: 70 [Emesis/NG output:70] Intake/Output this shift:    PE: Abd: soft, some tenderness, few BS, mild distention  Lab Results:   Recent Labs  09/08/13 1245 09/09/13 0628  WBC 9.5 5.9  HGB 11.6* 9.8*  HCT 33.6* 29.0*  PLT 206 165   BMET  Recent Labs  09/09/13 0628 09/10/13 0720  NA 141 142  K 3.5 3.5  CL 108 106  CO2 23 25  GLUCOSE 114* 78  BUN 23 21  CREATININE 1.25* 1.21*  CALCIUM 8.6 8.5   PT/INR  Recent Labs  09/10/13 0720 09/11/13 0730  LABPROT 29.9* 40.0*  INR 2.98* 4.36*   CMP     Component Value Date/Time   NA 142 09/10/2013 0720   K 3.5 09/10/2013 0720   CL 106 09/10/2013 0720   CO2 25 09/10/2013 0720   GLUCOSE 78 09/10/2013 0720   BUN 21 09/10/2013 0720   CREATININE 1.21* 09/10/2013 0720   CALCIUM 8.5 09/10/2013 0720   PROT 6.5 09/08/2013 1245   ALBUMIN 3.3* 09/08/2013 1245   AST 17 09/08/2013 1245   ALT 10 09/08/2013 1245   ALKPHOS 91 09/08/2013 1245   BILITOT 0.4 09/08/2013 1245   GFRNONAA 42* 09/10/2013 0720   GFRAA 48* 09/10/2013 0720   Lipase     Component Value Date/Time   LIPASE 34 08/04/2010 1331       Studies/Results: Dg Abd Portable 2v  09/10/2013   CLINICAL DATA:  Vomiting ; small bowel obstruction.  EXAM: PORTABLE ABDOMEN - 2 VIEW  COMPARISON:   Abdominal radiograph 09/09/2013  FINDINGS: Slight interval increase in gaseous distention of multiple loops of small bowel within the abdomen measuring up to 5.6 cm. Residual contrast material is demonstrated within the ascending colon. The NG tube is present with tip and side-port coursing towards the gastric antrum/ proximal duodenum. Decubitus view demonstrates no definite free intraperitoneal air. Lumbar spinal fusion hardware re- demonstrated.  IMPRESSION: 1. Slight interval increase in marked gaseous distention of multiple loops of small bowel compatible with small bowel obstruction. 2. No definite evidence for free intraperitoneal air. 3. NG tube has been advanced with the tip and side port projecting towards the right upper quadrant, likely within the gastric antrum/ proximal duodenum. These results will be called to the ordering clinician or representative by the Radiologist Assistant, and communication documented in the PACS Dashboard.   Electronically Signed   By: Annia Belt M.D.   On: 09/10/2013 09:12    Anti-infectives: Anti-infectives   Start     Dose/Rate Route Frequency Ordered Stop   09/09/13 1430  piperacillin-tazobactam (ZOSYN) IVPB 3.375 g     3.375 g 12.5 mL/hr over 240 Minutes Intravenous 3 times per day 09/09/13 1348     09/08/13  2230  azithromycin (ZITHROMAX) 500 mg in dextrose 5 % 250 mL IVPB  Status:  Discontinued     500 mg 250 mL/hr over 60 Minutes Intravenous Every 24 hours 09/08/13 2108 09/09/13 1342   09/08/13 2200  cefTRIAXone (ROCEPHIN) 1 g in dextrose 5 % 50 mL IVPB  Status:  Discontinued     1 g 100 mL/hr over 30 Minutes Intravenous Every 24 hours 09/08/13 2108 09/09/13 1342       Assessment/Plan  1. SBO Patient Active Problem List   Diagnosis Date Noted  . Small bowel obstruction 09/08/2013  . Abdominal pain 09/08/2013  . Nausea and vomiting 09/08/2013  . Diarrhea 09/08/2013  . CKD (chronic kidney disease), stage III 09/08/2013  . Dementia 09/08/2013   . Pacemaker dual chamber implanted August 2009 for sinus node dysfunction Medtronic Enrhythm 08/11/2013  . Diastolic dysfunction 08/11/2013  . Atrial fibrillation, paroxysmal   . Hyperlipidemia   . Hypertension   . Heart murmur   . Diverticulosis    Plan: 1. Patient doesn't look or feel great this morning.  Didn't eat any liquids this morning and having a lot of belching.  Will repeat abdominal films today to see if she is having a recurrence of her SBO.  Cont on clears only for now   LOS: 3 days    [name] E 09/11/2013, 9:46 AM Pager: [phone]

## 2013-09-11 NOTE — Progress Notes (Signed)
ANTICOAGULATION CONSULT NOTE - Follow Up Consult  Pharmacy Consult for Coumadin Indication: atrial fibrillation  Allergies  Allergen Reactions  . Oxycodone Hcl Other (See Comments)    HALLUCINATIONS  . Prednisone Other (See Comments)    Hallucinations  . Aspirin Other (See Comments)    GI UPSET  . Codeine Other (See Comments)    GI UPSET  . Sulfa Antibiotics Other (See Comments)    GI UPSET    Patient Measurements: Height: 5\' 3"  (160 cm) Weight: 132 lb 4.4 oz (60 kg) IBW/kg (Calculated) : 52.4 Heparin Dosing Weight:    Vital Signs: Temp: 97.7 F (36.5 C) (10/11 0610) Temp src: Oral (10/11 0610) BP: 98/61 mmHg (10/11 0610) Pulse Rate: 68 (10/11 0610)  Labs:  Recent Labs  09/08/13 1245  09/09/13 0628 09/10/13 0720 09/11/13 0730  HGB 11.6*  --  9.8*  --   --   HCT 33.6*  --  29.0*  --   --   PLT 206  --  165  --   --   LABPROT  --   < > 28.4* 29.9* 40.0*  INR  --   < > 2.78* 2.98* 4.36*  CREATININE 1.61*  --  1.25* 1.21*  --   < > = values in this interval not displayed.  Estimated Creatinine Clearance: 31.7 ml/min (by C-G formula based on Cr of 1.21).  Assessment: 79 yo female with hx of afib admitted with high grade SBO with adhesion. Having coffee ground emesis and dark stools prior to admission. Continues on coumadin for afib. INR increased significantly and now is supratherapeutic. No new CBC today. No bleeding noted  Plan: 1. No coumadin tonight 2. F/u AM INR  Lysle Pearl, PharmD, BCPS Pager # [phone] 09/11/2013 8:38 AM

## 2013-09-11 NOTE — Progress Notes (Signed)
Also seen by Dr. Ramirez Shanieka Blea, MD, MPH, FACS Pager: [phone]  

## 2013-09-11 NOTE — Progress Notes (Signed)
[facility] PROGRESS NOTE  Heather [name] WUJ:[id number] DOB: [birth year] DOA: 09/08/2013 PCP: Leo Grosser, MD  Assessment/Plan: 1. Small bowel obstruction. Patient complained of epigastric pain today, and overall not feeling well to her stomach. Her NG tube was discontinued and she was started on clear liquids. To my knowledge she has not vomited. Surgery following, planning for repeat abdominal film today. 2. Paroxysmal atrial fibrillation, status post pacemaker implant. Continue calcium channel blocker therapy and sotalol.   3. Suspected aspiration pneumonitis. Patient presented with multiple episodes of nausea and vomiting. Imaging studies revealed right-sided infiltrate. Continue empiric antibiotic therapy with IV Zosyn.  4. Hypertension. Blood pressure low this morning at 90/61 will put holding parameters on antihypertensive agents. 5. Anticoagulation. Patient's INR supratherapeutic this morning at 4.36. Could be due to affective antibiotics as well as minimal by mouth intake. Pharmacy consultation for Coumadin dosing 6. Acute renal failure. Improved after receiving IV fluids, likely secondary to prerenal azotemia. Follow a.m. BMP   Code Status: Full code Family Communication: Plan discussed with patient's husband present at bedside    Consultants:  General surgery   Antibiotics:  Zosyn (09/09/2013)  HPI/Subjective: We helped patient out of bed to chair this morning. She complains of epigastric discomfort. NG tube was discontinued and she was placed on clears. Patient with advanced dementia, unable to provide accurate history.  Objective: Filed Vitals:   09/11/13 0610  BP: 98/61  Pulse: 68  Temp: 97.7 F (36.5 C)  Resp: 20    Intake/Output Summary (Last 24 hours) at 09/11/13 1100 Last data filed at 09/10/13 2300  Gross per 24 hour  Intake    650 ml  Output      0 ml  Net    650 ml   Filed Weights   09/09/13 0528  Weight: 60 kg (132 lb 4.4 oz)     Exam:   General:  Patient appearing comfortable, in no acute distress. She is arousable however with history of advanced dementia, not responding to my questions.  Cardiovascular: Irregular rate and rhythm normal S1-S2 tachycardic  Respiratory: Patient having right sided crackles, normal respiratory effort of  Abdomen: There is some mild to moderate tenderness to palpation over the epigastric region otherwise no palpable masses no rebound tenderness or guarding noted.  Musculoskeletal: Present range of motion to all extremity  Extremities: No edema  Data Reviewed: Basic Metabolic Panel:  Recent Labs Lab 09/08/13 1245 09/09/13 0628 09/10/13 0720  NA 143 141 142  K 3.6 3.5 3.5  CL 107 108 106  CO2 26 23 25   GLUCOSE 114* 114* 78  BUN 28* 23 21  CREATININE 1.61* 1.25* 1.21*  CALCIUM 8.9 8.6 8.5   Liver Function Tests:  Recent Labs Lab 09/08/13 1245  AST 17  ALT 10  ALKPHOS 91  BILITOT 0.4  PROT 6.5  ALBUMIN 3.3*   No results found for this basename: LIPASE, AMYLASE,  in the last 168 hours No results found for this basename: AMMONIA,  in the last 168 hours CBC:  Recent Labs Lab 09/08/13 1245 09/09/13 0628  WBC 9.5 5.9  NEUTROABS 7.8*  --   HGB 11.6* 9.8*  HCT 33.6* 29.0*  MCV 84.8 85.3  PLT 206 165   Cardiac Enzymes: No results found for this basename: CKTOTAL, CKMB, CKMBINDEX, TROPONINI,  in the last 168 hours BNP (last 3 results) No results found for this basename: PROBNP,  in the last 8760 hours CBG: No results found for this basename: GLUCAP,  in  the last 168 hours  Recent Results (from the past 240 hour(s))  CULTURE, BLOOD (ROUTINE X 2)     Status: None   Collection Time    09/08/13 11:15 PM      Result Value Range Status   Specimen Description BLOOD RIGHT ARM   Final   Special Requests BOTTLES DRAWN AEROBIC ONLY 6CC   Final   Culture  Setup Time     Final   Value: 09/09/2013 03:39     Performed at [facility]   Culture      Final   Value:        BLOOD CULTURE RECEIVED NO GROWTH TO DATE CULTURE WILL BE HELD FOR 5 DAYS BEFORE ISSUING A FINAL NEGATIVE REPORT     Performed at [facility]   Report Status PENDING   Incomplete  CULTURE, BLOOD (ROUTINE X 2)     Status: None   Collection Time    09/09/13  1:05 AM      Result Value Range Status   Specimen Description BLOOD RIGHT ARM   Final   Special Requests BOTTLES DRAWN AEROBIC AND ANAEROBIC 10CC EACH   Final   Culture  Setup Time     Final   Value: 09/09/2013 10:28     Performed at [facility]   Culture     Final   Value:        BLOOD CULTURE RECEIVED NO GROWTH TO DATE CULTURE WILL BE HELD FOR 5 DAYS BEFORE ISSUING A FINAL NEGATIVE REPORT     Performed at [facility]   Report Status PENDING   Incomplete     Studies: Dg Abd Portable 2v  09/10/2013   CLINICAL DATA:  Vomiting ; small bowel obstruction.  EXAM: PORTABLE ABDOMEN - 2 VIEW  COMPARISON:  Abdominal radiograph 09/09/2013  FINDINGS: Slight interval increase in gaseous distention of multiple loops of small bowel within the abdomen measuring up to 5.6 cm. Residual contrast material is demonstrated within the ascending colon. The NG tube is present with tip and side-port coursing towards the gastric antrum/ proximal duodenum. Decubitus view demonstrates no definite free intraperitoneal air. Lumbar spinal fusion hardware re- demonstrated.  IMPRESSION: 1. Slight interval increase in marked gaseous distention of multiple loops of small bowel compatible with small bowel obstruction. 2. No definite evidence for free intraperitoneal air. 3. NG tube has been advanced with the tip and side port projecting towards the right upper quadrant, likely within the gastric antrum/ proximal duodenum. These results will be called to the ordering clinician or representative by the Radiologist Assistant, and communication documented in the PACS Dashboard.   Electronically Signed   By: Annia Belt M.D.   On:  09/10/2013 09:12    Scheduled Meds: . antiseptic oral rinse  15 mL Mouth Rinse q12n4p  . chlorhexidine  15 mL Mouth Rinse BID  . colchicine  0.6 mg Oral Daily  . diltiazem  240 mg Oral QHS  . losartan  100 mg Oral Daily  . memantine  10 mg Oral BID  . omega-3 acid ethyl esters  2 g Oral Daily  . pantoprazole  80 mg Oral Q1200  . piperacillin-tazobactam (ZOSYN)  IV  3.375 g Intravenous Q8H  . sodium chloride  3 mL Intravenous Q12H  . sotalol  80 mg Oral BID  . Warfarin - Pharmacist Dosing Inpatient   Does not apply q1800   Continuous Infusions:    Principal Problem:   Small bowel obstruction  Active Problems:   Hyperlipidemia   Hypertension   Diverticulosis   Atrial fibrillation, paroxysmal   Pacemaker dual chamber implanted August 2009 for sinus node dysfunction Medtronic Enrhythm   Diastolic dysfunction   Abdominal pain   Nausea and vomiting   Diarrhea   CKD (chronic kidney disease), stage III   Dementia    Time spent: 35 minutes    Jeralyn Bennett  [facility] Pager [phone]. If 7PM-7AM, please contact night-coverage at [email or web address], password [facility] 09/11/2013, 11:00 AM  LOS: 3 days

## 2013-09-12 DIAGNOSIS — D684 Acquired coagulation factor deficiency: Secondary | ICD-10-CM

## 2013-09-12 DIAGNOSIS — T45515A Adverse effect of anticoagulants, initial encounter: Secondary | ICD-10-CM

## 2013-09-12 LAB — BASIC METABOLIC PANEL
Calcium: 8.4 mg/dL (ref 8.4–10.5)
Chloride: 105 mEq/L (ref 96–112)
Creatinine, Ser: 1.01 mg/dL (ref 0.50–1.10)
GFR calc Af Amer: 60 mL/min — ABNORMAL LOW (ref >90)
GFR calc non Af Amer: 52 mL/min — ABNORMAL LOW (ref >90)
Potassium: 3.2 mEq/L — ABNORMAL LOW (ref 3.5–5.1)
Sodium: 142 mEq/L (ref 135–145)

## 2013-09-12 LAB — CBC
HCT: 26.2 % — ABNORMAL LOW (ref 36.0–46.0)
MCV: 84.2 fL (ref 78.0–100.0)
Platelets: 192 10*3/uL (ref 150–400)
RBC: 3.11 MIL/uL — ABNORMAL LOW (ref 3.87–5.11)
RDW: 13.8 % (ref 11.5–15.5)
WBC: 6 10*3/uL (ref 4.0–10.5)

## 2013-09-12 LAB — PROTIME-INR: INR: 6.12 (ref 0.00–1.49)

## 2013-09-12 MED ORDER — PHYTONADIONE 5 MG PO TABS
5.0000 mg | ORAL_TABLET | Freq: Once | ORAL | Status: AC
  Administered 2013-09-12: 5 mg via ORAL
  Filled 2013-09-12: qty 1

## 2013-09-12 MED ORDER — POTASSIUM CHLORIDE 10 MEQ/100ML IV SOLN
10.0000 meq | INTRAVENOUS | Status: AC
  Administered 2013-09-12 (×2): 10 meq via INTRAVENOUS
  Filled 2013-09-12 (×2): qty 100

## 2013-09-12 MED ORDER — POTASSIUM CHLORIDE 10 MEQ/100ML IV SOLN: 10.0000 meq | INTRAVENOUS | Status: DC

## 2013-09-12 MED ORDER — DILTIAZEM HCL 60 MG PO TABS
60.0000 mg | ORAL_TABLET | Freq: Four times a day (QID) | ORAL | Status: DC
  Administered 2013-09-13 – 2013-09-16 (×13): 60 mg via ORAL
  Filled 2013-09-12 (×18): qty 1

## 2013-09-12 MED ORDER — PHYTONADIONE 5 MG PO TABS: 5.0000 mg | ORAL_TABLET | Freq: Once | ORAL | Status: DC

## 2013-09-12 MED ORDER — LORAZEPAM 2 MG/ML IJ SOLN
0.5000 mg | Freq: Once | INTRAMUSCULAR | Status: AC
  Administered 2013-09-12: 0.5 mg via INTRAVENOUS
  Filled 2013-09-12: qty 1

## 2013-09-12 NOTE — Progress Notes (Addendum)
Pt asleep. Events noted.   cta  Reg Soft, nd. hypoBS  pSBO Hypokalemia - replace potassium by iv Pt can have clears when awake. Repeat plain films in AM  Mary Sella. Andrey Campanile, MD, FACS General, Bariatric, & Minimally Invasive Surgery [facility], [place]

## 2013-09-12 NOTE — Progress Notes (Addendum)
[facility] PROGRESS NOTE  Heather [name] ZOX:[id number] DOB: 1933-03-26 DOA: 09/08/2013 PCP: Leo Grosser, MD  Assessment/Plan: 1. Small bowel obstruction. A repeat abdominal x-ray yesterday showed less bowel dilation than on previous days study, conservative approach has been followed. Unfortunately patient having poor by mouth intake in the last 24 hours. 2. Paroxysmal atrial fibrillation, status post pacemaker implant. Continue calcium channel blocker therapy and sotalol.   3. Suspected aspiration pneumonitis. Patient presented with multiple episodes of nausea and vomiting. Imaging studies revealed right-sided infiltrate. Continue empiric antibiotic therapy with IV Zosyn.  4. Hypertension. Blood pressure low this morning at 90/61 will put holding parameters on antihypertensive agents. 5. Anticoagulation. Patient's INR continues to trend up to 6.12 today. I have ordered 5 mg of vitamin K to be administered, followup on daily INRs. Pharmacy consulted for warfarin dosing. 6. Hypokalemia. Potassium, down to 3.2, plan to replace with IV potassium 7. Acute renal failure. Improved after receiving IV fluids, likely secondary to prerenal azotemia.    Code Status: Full code Family Communication: Plan discussed with patient's husband present at bedside    Consultants:  General surgery   Antibiotics:  Zosyn (09/09/2013)  HPI/Subjective: Patient is agitated this morning, having minimal by mouth intake since yesterday. I am unable to gather if she is having abdominal pain. Per nursing staff, patient has not had nausea or vomiting.  Objective: Filed Vitals:   09/12/13 0520  BP: 130/67  Pulse: 82  Temp: 98.6 F (37 C)  Resp: 18    Intake/Output Summary (Last 24 hours) at 09/12/13 1053 Last data filed at 09/12/13 0612  Gross per 24 hour  Intake   1137 ml  Output      0 ml  Net   1137 ml   Filed Weights   09/09/13 0528 09/12/13 0520  Weight: 60 kg (132 lb 4.4 oz) 60.3  kg (132 lb 15 oz)    Exam:   General:  Patient appearing comfortable, in no acute distress. She is arousable however with history of advanced dementia, not responding to my questions.  Cardiovascular: Irregular rate and rhythm normal S1-S2 tachycardic  Respiratory: Patient having right sided crackles, normal respiratory effort of  Abdomen: Patient did not report significant pain to palpation across her abdomen. Her abdomen was soft, nondistended, no palpable masses.  Musculoskeletal: Present range of motion to all extremity  Extremities: No edema  Data Reviewed: Basic Metabolic Panel:  Recent Labs Lab 09/08/13 1245 09/09/13 0628 09/10/13 0720 09/12/13 0600  NA 143 141 142 142  K 3.6 3.5 3.5 3.2*  CL 107 108 106 105  CO2 26 23 25 23   GLUCOSE 114* 114* 78 84  BUN 28* 23 21 15   CREATININE 1.61* 1.25* 1.21* 1.01  CALCIUM 8.9 8.6 8.5 8.4   Liver Function Tests:  Recent Labs Lab 09/08/13 1245  AST 17  ALT 10  ALKPHOS 91  BILITOT 0.4  PROT 6.5  ALBUMIN 3.3*   No results found for this basename: LIPASE, AMYLASE,  in the last 168 hours No results found for this basename: AMMONIA,  in the last 168 hours CBC:  Recent Labs Lab 09/08/13 1245 09/09/13 0628 09/12/13 0600  WBC 9.5 5.9 6.0  NEUTROABS 7.8*  --   --   HGB 11.6* 9.8* 8.9*  HCT 33.6* 29.0* 26.2*  MCV 84.8 85.3 84.2  PLT 206 165 192   Cardiac Enzymes: No results found for this basename: CKTOTAL, CKMB, CKMBINDEX, TROPONINI,  in the last 168 hours BNP (last  3 results) No results found for this basename: PROBNP,  in the last 8760 hours CBG: No results found for this basename: GLUCAP,  in the last 168 hours  Recent Results (from the past 240 hour(s))  CULTURE, BLOOD (ROUTINE X 2)     Status: None   Collection Time    09/08/13 11:15 PM      Result Value Range Status   Specimen Description BLOOD RIGHT ARM   Final   Special Requests BOTTLES DRAWN AEROBIC ONLY 6CC   Final   Culture  Setup Time      Final   Value: 09/09/2013 03:39     Performed at [facility]   Culture     Final   Value:        BLOOD CULTURE RECEIVED NO GROWTH TO DATE CULTURE WILL BE HELD FOR 5 DAYS BEFORE ISSUING A FINAL NEGATIVE REPORT     Performed at [facility]   Report Status PENDING   Incomplete  CULTURE, BLOOD (ROUTINE X 2)     Status: None   Collection Time    09/09/13  1:05 AM      Result Value Range Status   Specimen Description BLOOD RIGHT ARM   Final   Special Requests BOTTLES DRAWN AEROBIC AND ANAEROBIC 10CC EACH   Final   Culture  Setup Time     Final   Value: 09/09/2013 10:28     Performed at [facility]   Culture     Final   Value:        BLOOD CULTURE RECEIVED NO GROWTH TO DATE CULTURE WILL BE HELD FOR 5 DAYS BEFORE ISSUING A FINAL NEGATIVE REPORT     Performed at [facility]   Report Status PENDING   Incomplete     Studies: Dg Abd 2 Views  09/11/2013   CLINICAL DATA:  Followup small bowel obstruction.  EXAM: ABDOMEN - 2 VIEW  COMPARISON:  09/10/2013.  FINDINGS: Distended loops of small bowel are again noted in the central abdomen. There is less small bowel distention than there was on the previous day study. Air-fluid levels are noted on the erect view. There is stool within a nondistended colon.  No free air.  IMPRESSION: There is less small bowel dilation than on the previous day's study, suggesting mild improvement in the degree of small bowel obstruction. The partial small bowel obstruction, persists, however. No free air.   Electronically Signed   By: Amie Portland M.D.   On: 09/11/2013 13:34    Scheduled Meds: . antiseptic oral rinse  15 mL Mouth Rinse q12n4p  . chlorhexidine  15 mL Mouth Rinse BID  . colchicine  0.6 mg Oral Daily  . diltiazem  240 mg Oral QHS  . losartan  100 mg Oral Daily  . memantine  10 mg Oral BID  . omega-3 acid ethyl esters  2 g Oral Daily  . pantoprazole  80 mg Oral Q1200  . piperacillin-tazobactam (ZOSYN)  IV  3.375 g  Intravenous Q8H  . sodium chloride  3 mL Intravenous Q12H  . sotalol  80 mg Oral BID  . Warfarin - Pharmacist Dosing Inpatient   Does not apply q1800   Continuous Infusions: . sodium chloride 75 mL/hr at 09/11/13 1605    Principal Problem:   Small bowel obstruction Active Problems:   Hyperlipidemia   Hypertension   Diverticulosis   Atrial fibrillation, paroxysmal   Pacemaker dual chamber implanted August 2009 for sinus node  dysfunction Medtronic Enrhythm   Diastolic dysfunction   Abdominal pain   Nausea and vomiting   Diarrhea   CKD (chronic kidney disease), stage III   Dementia    Time spent: 35 minutes    Jeralyn Bennett  [facility] Pager [phone]. If 7PM-7AM, please contact night-coverage at [email or web address], password [facility] 09/12/2013, 10:53 AM  LOS: 4 days

## 2013-09-12 NOTE — Progress Notes (Signed)
[facility]  DAILY PROGRESS NOTE   Subjective:  Confused and agitated. No clear signs of pain.  Objective:  Temp:  [97.9 F (36.6 C)-98.6 F (37 C)] 98.6 F (37 C) (10/12 0520) Pulse Rate:  [58-82] 82 (10/12 0520) Resp:  [16-18] 18 (10/12 0520) BP: (130-146)/(63-70) 130/67 mmHg (10/12 0520) SpO2:  [94 %-97 %] 94 % (10/12 0520) Weight:  [132 lb 15 oz (60.3 kg)] 132 lb 15 oz (60.3 kg) (10/12 0520) Weight change:   Intake/Output from previous day: 10/11 0701 - 10/12 0700 In: 1137 [I.V.:1037; IV Piggyback:100] Out: -   Intake/Output from this shift:    Medications: Current Facility-Administered Medications  Medication Dose Route Frequency Provider Last Rate Last Dose  . 0.9 %  sodium chloride infusion   Intravenous Continuous Jeralyn Bennett, MD 75 mL/hr at 09/11/13 1605    . acetaminophen (TYLENOL) tablet 650 mg  650 mg Oral Q6H PRN Cristal Ford, MD   650 mg at 09/10/13 2339   Or  . acetaminophen (TYLENOL) suppository 650 mg  650 mg Rectal Q6H PRN Cristal Ford, MD      . alum & mag hydroxide-simeth (MAALOX/MYLANTA) 200-200-20 MG/5ML suspension 30 mL  30 mL Oral Q4H PRN Jeralyn Bennett, MD   30 mL at 09/11/13 2257  . antiseptic oral rinse (BIOTENE) solution 15 mL  15 mL Mouth Rinse q12n4p Jeralyn Bennett, MD   15 mL at 09/10/13 1650  . chlorhexidine (PERIDEX) 0.12 % solution 15 mL  15 mL Mouth Rinse BID Jeralyn Bennett, MD   15 mL at 09/12/13 0834  . colchicine tablet 0.6 mg  0.6 mg Oral Daily Cristal Ford, MD   0.6 mg at 09/11/13 1055  . cyclobenzaprine (FLEXERIL) tablet 10 mg  10 mg Oral TID PRN Cristal Ford, MD   10 mg at 09/11/13 2102  . diltiazem (CARDIZEM CD) 24 hr capsule 240 mg  240 mg Oral QHS Cristal Ford, MD   240 mg at 09/11/13 2245  . losartan (COZAAR) tablet 100 mg  100 mg Oral Daily Cristal Ford, MD   100 mg at 09/11/13 1055  . memantine (NAMENDA) tablet 10 mg  10 mg Oral BID Cristal Ford, MD   10 mg at 09/11/13 2248  .  omega-3 acid ethyl esters (LOVAZA) capsule 2 g  2 g Oral Daily Cristal Ford, MD   2 g at 09/10/13 1004  . ondansetron (ZOFRAN) tablet 4 mg  4 mg Oral Q6H PRN Cristal Ford, MD       Or  . ondansetron (ZOFRAN) injection 4 mg  4 mg Intravenous Q6H PRN Cristal Ford, MD   4 mg at 09/10/13 1654  . pantoprazole (PROTONIX) EC tablet 80 mg  80 mg Oral Q1200 Cristal Ford, MD   80 mg at 09/11/13 1057  . piperacillin-tazobactam (ZOSYN) IVPB 3.375 g  3.375 g Intravenous Q8H Lennon Alstrom, RPH   3.375 g at 09/12/13 2952  . sodium chloride 0.9 % injection 3 mL  3 mL Intravenous Q12H Cristal Ford, MD   3 mL at 09/10/13 1004  . sotalol (BETAPACE) tablet 80 mg  80 mg Oral BID Cristal Ford, MD   80 mg at 09/11/13 2249  . Warfarin - Pharmacist Dosing Inpatient   Does not apply q1800 Cristal Ford, MD        Physical Exam: General appearance: uncooperative and sedated Neck: no adenopathy, no carotid bruit,  no JVD, supple, symmetrical, trachea midline and thyroid not enlarged, symmetric, no tenderness/mass/nodules Lungs: clear to auscultation bilaterally Heart: irregularly irregular rhythm Abdomen: soft, non-tender; bowel sounds normal; no masses,  no organomegaly Extremities: extremities normal, atraumatic, no cyanosis or edema Pulses: 2+ and symmetric Skin: poor turgor Neurologic: unable to obtain  Lab Results: Results for orders placed during the hospital encounter of 09/08/13 (from the past 48 hour(s))  PROTIME-INR     Status: Abnormal   Collection Time    09/11/13  7:30 AM      Result Value Range   Prothrombin Time 40.0 (*) 11.6 - 15.2 seconds   INR 4.36 (*) 0.00 - 1.49  PROTIME-INR     Status: Abnormal   Collection Time    09/12/13  6:00 AM      Result Value Range   Prothrombin Time 51.8 (*) 11.6 - 15.2 seconds   INR 6.12 (*) 0.00 - 1.49   Comment: CRITICAL RESULT CALLED TO, READ BACK BY AND VERIFIED WITH:     MCHAMZE J.,RN 09/12/13 0741 BY JONESJ  CBC     Status: Abnormal    Collection Time    09/12/13  6:00 AM      Result Value Range   WBC 6.0  4.0 - 10.5 K/uL   RBC 3.11 (*) 3.87 - 5.11 MIL/uL   Hemoglobin 8.9 (*) 12.0 - 15.0 g/dL   HCT 78.2 (*) 95.6 - 21.3 %   MCV 84.2  78.0 - 100.0 fL   MCH 28.6  26.0 - 34.0 pg   MCHC 34.0  30.0 - 36.0 g/dL   RDW 08.6  57.8 - 46.9 %   Platelets 192  150 - 400 K/uL  BASIC METABOLIC PANEL     Status: Abnormal   Collection Time    09/12/13  6:00 AM      Result Value Range   Sodium 142  135 - 145 mEq/L   Potassium 3.2 (*) 3.5 - 5.1 mEq/L   Chloride 105  96 - 112 mEq/L   CO2 23  19 - 32 mEq/L   Glucose, Bld 84  70 - 99 mg/dL   BUN 15  6 - 23 mg/dL   Creatinine, Ser 6.29  0.50 - 1.10 mg/dL   Calcium 8.4  8.4 - 52.8 mg/dL   GFR calc non Af Amer 52 (*) >90 mL/min   GFR calc Af Amer 60 (*) >90 mL/min   Comment: (NOTE)     The eGFR has been calculated using the CKD EPI equation.     This calculation has not been validated in all clinical situations.     eGFR's persistently <90 mL/min signify possible Chronic Kidney     Disease.    Imaging: Dg Abd 2 Views  09/11/2013   CLINICAL DATA:  Followup small bowel obstruction.  EXAM: ABDOMEN - 2 VIEW  COMPARISON:  09/10/2013.  FINDINGS: Distended loops of small bowel are again noted in the central abdomen. There is less small bowel distention than there was on the previous day study. Air-fluid levels are noted on the erect view. There is stool within a nondistended colon.  No free air.  IMPRESSION: There is less small bowel dilation than on the previous day's study, suggesting mild improvement in the degree of small bowel obstruction. The partial small bowel obstruction, persists, however. No free air.   Electronically Signed   By: Amie Portland M.D.   On: 09/11/2013 13:34    Assessment:  1. Principal Problem: 2.  Small bowel obstruction 3. Active Problems: 4.   Hyperlipidemia 5.   Hypertension 6.   Diverticulosis 7.   Atrial fibrillation, paroxysmal 8.   Pacemaker dual  chamber implanted August 2009 for sinus node dysfunction [employer] Enrhythm 9.   Diastolic dysfunction 10.   Abdominal pain 11.   Nausea and vomiting 12.   Diarrhea 13.   CKD (chronic kidney disease), stage III 14.   Dementia 15.   Plan:  1. No overt bleeding 2. Has received vit K - will have a rather unpredictable response as antibiotics continue and PO intake is poor, follow INR daily 3. Rate control is good, no changes planned to other cardiac meds at this time.  Time Spent Directly with Patient:  30  minutes  Length of Stay:  LOS: 4 days    Heather [name] 09/12/2013, 11:23 AM

## 2013-09-12 NOTE — Progress Notes (Signed)
Patient ID: Heather [name], female   DOB: [birth year], 79 y.o.   MRN: [id number]    Subjective: Pt very agitated today.  Unable to answer questions.  Hasn't eaten much of her diet. Husband said she passed more flatus yesterday.    Objective: Vital signs in last 24 hours: Temp:  [97.9 F (36.6 C)-98.6 F (37 C)] 98.6 F (37 C) (10/12 0520) Pulse Rate:  [58-82] 82 (10/12 0520) Resp:  [16-18] 18 (10/12 0520) BP: (130-146)/(63-70) 130/67 mmHg (10/12 0520) SpO2:  [94 %-97 %] 94 % (10/12 0520) Weight:  [132 lb 15 oz (60.3 kg)] 132 lb 15 oz (60.3 kg) (10/12 0520) Last BM Date: 09/09/13  Intake/Output from previous day: 09-29-23 0701 - 10/12 0700 In: 1137 [I.V.:1037; IV Piggyback:100] Out: -  Intake/Output this shift:    PE: Abd: softer, doesn't seem tender with palpation, some BS, ND  Lab Results:   Recent Labs  09/12/13 0600  WBC 6.0  HGB 8.9*  HCT 26.2*  PLT 192   BMET  Recent Labs  09/10/13 0720  NA 142  K 3.5  CL 106  CO2 25  GLUCOSE 78  BUN 21  CREATININE 1.21*  CALCIUM 8.5   PT/INR  Recent Labs  2013/09/28 0730 09/12/13 0600  LABPROT 40.0* 51.8*  INR 4.36* 6.12*   CMP     Component Value Date/Time   NA 142 09/10/2013 0720   K 3.5 09/10/2013 0720   CL 106 09/10/2013 0720   CO2 25 09/10/2013 0720   GLUCOSE 78 09/10/2013 0720   BUN 21 09/10/2013 0720   CREATININE 1.21* 09/10/2013 0720   CALCIUM 8.5 09/10/2013 0720   PROT 6.5 09/08/2013 1245   ALBUMIN 3.3* 09/08/2013 1245   AST 17 09/08/2013 1245   ALT 10 09/08/2013 1245   ALKPHOS 91 09/08/2013 1245   BILITOT 0.4 09/08/2013 1245   GFRNONAA 42* 09/10/2013 0720   GFRAA 48* 09/10/2013 0720   Lipase     Component Value Date/Time   LIPASE 34 08/04/2010 1331       Studies/Results: Dg Abd 2 Views  28-Sep-2013   CLINICAL DATA:  Followup small bowel obstruction.  EXAM: ABDOMEN - 2 VIEW  COMPARISON:  09/10/2013.  FINDINGS: Distended loops of small bowel are again noted in the central abdomen. There is less  small bowel distention than there was on the previous day study. Air-fluid levels are noted on the erect view. There is stool within a nondistended colon.  No free air.  IMPRESSION: There is less small bowel dilation than on the previous day's study, suggesting mild improvement in the degree of small bowel obstruction. The partial small bowel obstruction, persists, however. No free air.   Electronically Signed   By: Amie Portland M.D.   On: 2013-09-28 13:34   Dg Abd Portable 2v  09/10/2013   CLINICAL DATA:  Vomiting ; small bowel obstruction.  EXAM: PORTABLE ABDOMEN - 2 VIEW  COMPARISON:  Abdominal radiograph 09/09/2013  FINDINGS: Slight interval increase in gaseous distention of multiple loops of small bowel within the abdomen measuring up to 5.6 cm. Residual contrast material is demonstrated within the ascending colon. The NG tube is present with tip and side-port coursing towards the gastric antrum/ proximal duodenum. Decubitus view demonstrates no definite free intraperitoneal air. Lumbar spinal fusion hardware re- demonstrated.  IMPRESSION: 1. Slight interval increase in marked gaseous distention of multiple loops of small bowel compatible with small bowel obstruction. 2. No definite evidence for free intraperitoneal air. 3. NG  tube has been advanced with the tip and side port projecting towards the right upper quadrant, likely within the gastric antrum/ proximal duodenum. These results will be called to the ordering clinician or representative by the Radiologist Assistant, and communication documented in the PACS Dashboard.   Electronically Signed   By: Annia Belt M.D.   On: 09/10/2013 09:12    Anti-infectives: Anti-infectives   Start     Dose/Rate Route Frequency Ordered Stop   09/09/13 1430  piperacillin-tazobactam (ZOSYN) IVPB 3.375 g     3.375 g 12.5 mL/hr over 240 Minutes Intravenous 3 times per day 09/09/13 1348     09/08/13 2230  azithromycin (ZITHROMAX) 500 mg in dextrose 5 % 250 mL IVPB   Status:  Discontinued     500 mg 250 mL/hr over 60 Minutes Intravenous Every 24 hours 09/08/13 2108 09/09/13 1342   09/08/13 2200  cefTRIAXone (ROCEPHIN) 1 g in dextrose 5 % 50 mL IVPB  Status:  Discontinued     1 g 100 mL/hr over 30 Minutes Intravenous Every 24 hours 09/08/13 2108 09/09/13 1342       Assessment/Plan  1. SBO, appears to be somewhat improved 2. supratherapeutic INR, 6.12 today 3. Acute delirium on chronic dementia  Plan: 1. Patient is difficult to assess today given acute agitation.  Her abdomen seems softer today.  Will continue clear liquids today to see if she will eat them and how she does with them.  If well, then hopefully she can be advanced tomorrow. 2. Agree with holding coumadin, patient at risk for spontaneous bleed with INR of 6.   LOS: 4 days    Macauley Mossberg E 09/12/2013, 8:46 AM Pager: [phone]

## 2013-09-12 NOTE — Progress Notes (Addendum)
ANTICOAGULATION CONSULT NOTE - Follow Up Consult  Pharmacy Consult for Coumadin + zosyn Indication: atrial fibrillation & PNA  Allergies  Allergen Reactions  . Oxycodone Hcl Other (See Comments)    HALLUCINATIONS  . Prednisone Other (See Comments)    Hallucinations  . Aspirin Other (See Comments)    GI UPSET  . Codeine Other (See Comments)    GI UPSET  . Sulfa Antibiotics Other (See Comments)    GI UPSET    Patient Measurements: Height: 5\' 3"  (160 cm) Weight: 132 lb 15 oz (60.3 kg) IBW/kg (Calculated) : 52.4 Heparin Dosing Weight:    Vital Signs: Temp: 98.6 F (37 C) (10/12 0520) Temp src: Oral (10/12 0520) BP: 130/67 mmHg (10/12 0520) Pulse Rate: 82 (10/12 0520)  Labs:  Recent Labs  09/10/13 0720 09/11/13 0730 09/12/13 0600  HGB  --   --  8.9*  HCT  --   --  26.2*  PLT  --   --  192  LABPROT 29.9* 40.0* 51.8*  INR 2.98* 4.36* 6.12*  CREATININE 1.21*  --   --     Estimated Creatinine Clearance: 31.7 ml/min (by C-G formula based on Cr of 1.21).  Assessment: 79 yo female with hx of afib admitted with high grade SBO with adhesion. Having coffee ground emesis and dark stools prior to admission. Continues on coumadin for afib. INR continues to increase despite held dose. INR is 6.12 today. H/H is down to 8.9/26.2, plts 192 but no overt bleeding noted.  She also continues on zosyn day #4 for possible pneumonia. Pt is afebrile and WBC is WNL. Dose is appropriate. Cultures are negative to date.   Plan: 1. No coumadin tonight 2. F/u AM INR 3. Continue zosyn 3.375gm IV Q8H (4 hr inf) 4. F/u renal fxn, C&S, clinical status and planned LOT  Lysle Pearl, PharmD, BCPS Pager # [phone] 09/12/2013 7:48 AM

## 2013-09-13 ENCOUNTER — Inpatient Hospital Stay (HOSPITAL_COMMUNITY): Payer: Medicare Other

## 2013-09-13 LAB — BASIC METABOLIC PANEL
BUN: 10 mg/dL (ref 6–23)
Calcium: 8.5 mg/dL (ref 8.4–10.5)
Chloride: 106 mEq/L (ref 96–112)
Creatinine, Ser: 0.91 mg/dL (ref 0.50–1.10)
GFR calc Af Amer: 68 mL/min — ABNORMAL LOW (ref >90)
GFR calc non Af Amer: 59 mL/min — ABNORMAL LOW (ref >90)
Glucose, Bld: 96 mg/dL (ref 70–99)

## 2013-09-13 LAB — CBC
HCT: 25.8 % — ABNORMAL LOW (ref 36.0–46.0)
MCHC: 34.1 g/dL (ref 30.0–36.0)
Platelets: 196 10*3/uL (ref 150–400)
RDW: 14.2 % (ref 11.5–15.5)

## 2013-09-13 LAB — PROTIME-INR
INR: 2.45 — ABNORMAL HIGH (ref 0.00–1.49)
Prothrombin Time: 25.8 seconds — ABNORMAL HIGH (ref 11.6–15.2)

## 2013-09-13 MED ORDER — LORAZEPAM 2 MG/ML IJ SOLN
0.5000 mg | Freq: Once | INTRAMUSCULAR | Status: AC
  Administered 2013-09-13: 0.5 mg via INTRAVENOUS
  Filled 2013-09-13: qty 1

## 2013-09-13 MED ORDER — WARFARIN SODIUM 1 MG PO TABS
1.0000 mg | ORAL_TABLET | Freq: Once | ORAL | Status: AC
  Administered 2013-09-13: 1 mg via ORAL
  Filled 2013-09-13: qty 1

## 2013-09-13 MED ORDER — AMOXICILLIN-POT CLAVULANATE 875-125 MG PO TABS
1.0000 | ORAL_TABLET | Freq: Two times a day (BID) | ORAL | Status: DC
  Administered 2013-09-13 – 2013-09-16 (×7): 1 via ORAL
  Filled 2013-09-13 (×9): qty 1

## 2013-09-13 MED ORDER — LORAZEPAM 2 MG/ML IJ SOLN
0.5000 mg | Freq: Once | INTRAMUSCULAR | Status: AC
  Administered 2013-09-14: 0.5 mg via INTRAVENOUS
  Filled 2013-09-13: qty 1

## 2013-09-13 NOTE — Progress Notes (Signed)
Physical Therapy Treatment Patient Details Name: Heather [name] MRN: [id number] DOB: 07/03/1933 Today's Date: 09/13/2013 Time: 1478-2956 PT Time Calculation (min): 18 min  PT Assessment / Plan / Recommendation  History of Present Illness Patient is a 79 y/o female admitted with abdominal pain, diarrhea and vomiting positive for SBO.  PMH positive for hypertension, A. fib on chronic anticoagulation, chronic low back pain, mild dementia, hypertension, status post pacemaker, arthritis, GERD, and scoliosis    PT Comments   Patient ambulated with increased encouragement today. Patient demonstrates increased activity tolerance despite pain. Will continue to work with patient and progress as tolerated.   Follow Up Recommendations  Home health PT;Supervision/Assistance - 24 hour     Does the patient have the potential to tolerate intense rehabilitation     Barriers to Discharge        Equipment Recommendations  Rolling walker with 5" wheels    Recommendations for Other Services    Frequency Min 3X/week   Progress towards PT Goals Progress towards PT goals: Progressing toward goals  Plan Current plan remains appropriate    Precautions / Restrictions Precautions Precautions: Fall Restrictions Weight Bearing Restrictions: No   Pertinent Vitals/Pain Pain in right abdominal area, no value given    Mobility  Bed Mobility Bed Mobility: Supine to Sit;Sitting - Scoot to Edge of Bed;Sit to Supine Supine to Sit: 4: Min assist;With rails;HOB elevated Sitting - Scoot to Edge of Bed: 4: Min guard;With rail Sit to Supine: 4: Min guard;HOB elevated;With rail Details for Bed Mobility Assistance: Pt using bed rail to help progress to EOB. Pt needed (A) to manage lines and leads Transfers Transfers: Sit to Stand;Stand to Sit Sit to Stand: 4: Min assist;From toilet Stand to Sit: 4: Min assist;To bed Details for Transfer Assistance: assist for safety, pt grabbing items to hold onto with right  UE Ambulation/Gait Ambulation/Gait Assistance: 3: Mod assist;4: Min assist;1: +2 Total assist Ambulation Distance (Feet): 160 Feet Assistive device: 1 person hand held assist Ambulation/Gait Assistance Details: assist for stability, cues for use of IV pole for support Gait Pattern: Step-through pattern;Decreased stride length;Wide base of support Gait velocity: decreased    Exercises     PT Diagnosis:    PT Problem List:   PT Treatment Interventions:     PT Goals (current goals can now be found in the care plan section) Acute Rehab PT Goals Patient Stated Goal: none state PT Goal Formulation: With patient/family Time For Goal Achievement: 09/24/13 Potential to Achieve Goals: Good  Visit Information  Last PT Received On: 09/13/13 Assistance Needed: +1 History of Present Illness: Patient is a 79 y/o female admitted with abdominal pain, diarrhea and vomiting positive for SBO.  PMH positive for hypertension, A. fib on chronic anticoagulation, chronic low back pain, mild dementia, hypertension, status post pacemaker, arthritis, GERD, and scoliosis     Subjective Data  Patient Stated Goal: none state   Cognition  Cognition Arousal/Alertness: Awake/alert Behavior During Therapy: WFL for tasks assessed/performed Overall Cognitive Status: History of cognitive impairments - at baseline    Balance  Balance Balance Assessed: Yes Static Standing Balance Static Standing - Balance Support: During functional activity;No upper extremity supported Static Standing - Level of Assistance: 5: Stand by assistance Static Standing - Comment/# of Minutes: standing at window during rest break  End of Session PT - End of Session Equipment Utilized During Treatment: Gait belt Activity Tolerance: Patient limited by fatigue Patient left: in bed;with call bell/phone within reach;with bed alarm set;with family/visitor  present Nurse Communication: Mobility status   GP     Fabio Asa 09/13/2013, 12:20 PM Charlotte Crumb, PT DPT  [phone]

## 2013-09-13 NOTE — Progress Notes (Signed)
ANTICOAGULATION CONSULT NOTE - Follow Up Consult  Pharmacy Consult for Coumadin Indication: atrial fibrillation  Allergies  Allergen Reactions  . Oxycodone Hcl Other (See Comments)    HALLUCINATIONS  . Prednisone Other (See Comments)    Hallucinations  . Aspirin Other (See Comments)    GI UPSET  . Codeine Other (See Comments)    GI UPSET  . Sulfa Antibiotics Other (See Comments)    GI UPSET    Patient Measurements: Height: 5\' 3"  (160 cm) Weight: 135 lb 5.8 oz (61.4 kg) IBW/kg (Calculated) : 52.4 Heparin Dosing Weight:    Vital Signs: Temp: 97.6 F (36.4 C) (10/13 0605) Temp src: Oral (10/13 0605) BP: 136/65 mmHg (10/13 0605) Pulse Rate: 92 (10/13 0605)  Labs:  Recent Labs  09/11/13 0730 09/12/13 0600 09/13/13 0615  HGB  --  8.9* 8.8*  HCT  --  26.2* 25.8*  PLT  --  192 196  LABPROT 40.0* 51.8* 25.8*  INR 4.36* 6.12* 2.45*  CREATININE  --  1.01 0.91    Estimated Creatinine Clearance: 42.1 ml/min (by C-G formula based on Cr of 0.91).   Assessment: 79 yo female with hx of Afib admitted with high grade SBO with adhesion. Having coffee ground emesis and dark stools prior to admission.  PMH: HLD, gout, afib, dementia, HTN, OA, GERD, seizures  AC: Warfarin for afib - INR 6.12 down to 2.45 today, no bleeding noted (PTA dose 1mg  MWFSa and 2mg  TTSun with admit INR 2.29) - MD ordered vitamin K 5mg  PO 10/12.  ID: Zosyn D#[id number] PNA - Tmax 99, WBC 6.3, CrCl 42  Zosyn 10/9 >> CTX 10/9 >> 10/9 Azith 10/9 >> 10/9  10/9 Blood - pending  CV: Hx HTN, HLD, afib - VSS - dilt, losartan, lovaza, sotalol. CHADS2=2.  Endo: No hx - CBGs good, colchicine  GI/Nutr: Hx GERD - Plan conservative treatment for now for SBO, LFTs WNL, po PPI, passing flatus, +BS, +BM, SBO slowly improving. Plan to advance to full liquids  Neuro: Hx dementia, seizures, chronic pain - Namenda. Can become agitated.  Renal: Scr 0.91  Heme/Onc: See AC. Hgb stable.  Best Practices: Coumadin,  SCD, PPI, MC  Goal of Therapy:  INR 2-3 Monitor platelets by anticoagulation protocol: Yes   Plan:  Coumadin 1mg  po x 1 today.  Merilynn Finland, [employer] 09/13/2013,8:53 AM

## 2013-09-13 NOTE — Progress Notes (Signed)
Patient ID: EVELENA MASCI, female   DOB: [birth year], 79 y.o.   MRN: [id number]    Subjective: Patient is sleeping.  Son states that she is passing flatus.  Hasn't c/o nausea.  Objective: Vital signs in last 24 hours: Temp:  [97.6 F (36.4 C)-99 F (37.2 C)] 97.6 F (36.4 C) 25-Sep-2023 0605) Pulse Rate:  [73-92] 92 Sep 25, 2023 0605) Resp:  [18] 18 09/25/23 0605) BP: (136-149)/(65-107) 136/65 mmHg 09-25-2023 0605) SpO2:  [93 %-96 %] 96 % 09-25-2023 0605) Weight:  [135 lb 5.8 oz (61.4 kg)] 135 lb 5.8 oz (61.4 kg) 09/25/23 0605) Last BM Date: 09/11/13  Intake/Output from previous day: 10/12 0701 September 25, 2023 0700 In: 2315 [P.O.:480; I.V.:1785; IV Piggyback:50] Out: -  Intake/Output this shift:    PE: Abd: soft, minimally tender, +BS, some lower bloating, but otherwise ND  Lab Results:   Recent Labs  09/12/13 0600 September 24, 2013 0615  WBC 6.0 6.3  HGB 8.9* 8.8*  HCT 26.2* 25.8*  PLT 192 196   BMET  Recent Labs  09/12/13 0600 09/24/2013 0615  NA 142 139  K 3.2* 3.7  CL 105 106  CO2 23 22  GLUCOSE 84 96  BUN 15 10  CREATININE 1.01 0.91  CALCIUM 8.4 8.5   PT/INR  Recent Labs  09/12/13 0600 09-24-2013 0615  LABPROT 51.8* 25.8*  INR 6.12* 2.45*   CMP     Component Value Date/Time   NA 139 Sep 24, 2013 0615   K 3.7 September 24, 2013 0615   CL 106 September 24, 2013 0615   CO2 22 24-Sep-2013 0615   GLUCOSE 96 09/24/2013 0615   BUN 10 2013-09-24 0615   CREATININE 0.91 Sep 24, 2013 0615   CALCIUM 8.5 2013-09-24 0615   PROT 6.5 09/08/2013 1245   ALBUMIN 3.3* 09/08/2013 1245   AST 17 09/08/2013 1245   ALT 10 09/08/2013 1245   ALKPHOS 91 09/08/2013 1245   BILITOT 0.4 09/08/2013 1245   GFRNONAA 59* Sep 24, 2013 0615   GFRAA 68* 09-24-13 0615   Lipase     Component Value Date/Time   LIPASE 34 08/04/2010 1331       Studies/Results: Dg Abd 2 Views  09/24/2013   CLINICAL DATA:  Abdominal distention.  EXAM: ABDOMEN - 2 VIEW  COMPARISON:  09/11/2013  FINDINGS: Mild diffuse gaseous distention of bowel,  predominantly small bowel. No real change since prior study, suggesting small bowel obstruction pattern. No free air organomegaly. Postoperative changes in the lumbar spine.  IMPRESSION: Stable small bowel distention.   Electronically Signed   By: Charlett Nose M.D.   On: 24-Sep-2013 07:29   Dg Abd 2 Views  09/11/2013   CLINICAL DATA:  Followup small bowel obstruction.  EXAM: ABDOMEN - 2 VIEW  COMPARISON:  09/10/2013.  FINDINGS: Distended loops of small bowel are again noted in the central abdomen. There is less small bowel distention than there was on the previous day study. Air-fluid levels are noted on the erect view. There is stool within a nondistended colon.  No free air.  IMPRESSION: There is less small bowel dilation than on the previous day's study, suggesting mild improvement in the degree of small bowel obstruction. The partial small bowel obstruction, persists, however. No free air.   Electronically Signed   By: Amie Portland M.D.   On: 09/11/2013 13:34    Anti-infectives: Anti-infectives   Start     Dose/Rate Route Frequency Ordered Stop   09/09/13 1430  piperacillin-tazobactam (ZOSYN) IVPB 3.375 g     3.375 g 12.5 mL/hr over  240 Minutes Intravenous 3 times per day 09/09/13 1348     09/08/13 2230  azithromycin (ZITHROMAX) 500 mg in dextrose 5 % 250 mL IVPB  Status:  Discontinued     500 mg 250 mL/hr over 60 Minutes Intravenous Every 24 hours 09/08/13 2108 09/09/13 1342   09/08/13 2200  cefTRIAXone (ROCEPHIN) 1 g in dextrose 5 % 50 mL IVPB  Status:  Discontinued     1 g 100 mL/hr over 30 Minutes Intravenous Every 24 hours 09/08/13 2108 09/09/13 1342       Assessment/Plan  1. SBO, slow improvements 2. Asp PNA 3. Dementia  Plan: 1. The patient seems to be slowly improving.  Her films today look somewhat better.  She still has some small bowel dilatation, but she has more colonic air.  Will advance to full liquids and see how she does. 2. She needs to mobilize   LOS: 5 days     Alysia Scism E 09/13/2013, 8:37 AM Pager: [phone]

## 2013-09-13 NOTE — Progress Notes (Signed)
Occupational Therapy Treatment Patient Details Name: Heather [name] MRN: [id number] DOB: Apr 03, 1933 Today's Date: 09/13/2013 Time: 8295-6213 OT Time Calculation (min): 36 min  OT Assessment / Plan / Recommendation  History of present illness Patient is a 79 y/o female admitted with abdominal pain, diarrhea and vomiting positive for SBO.  PMH positive for hypertension, A. fib on chronic anticoagulation, chronic low back pain, mild dementia, hypertension, status post pacemaker, arthritis, GERD, and scoliosis    OT comments  Pt performed grooming tasks as well as toileting. Pt ambulated in hallway. Pt with decreased activity tolerance/not feeling well.   Follow Up Recommendations  SNF;Supervision/Assistance - 24 hour    Barriers to Discharge       Equipment Recommendations  None recommended by OT    Recommendations for Other Services    Frequency Min 2X/week   Progress towards OT Goals Progress towards OT goals: Progressing toward goals (goals updated)  Plan Discharge plan remains appropriate    Precautions / Restrictions Precautions Precautions: Fall Restrictions Weight Bearing Restrictions: No   Pertinent Vitals/Pain Pain in stomach. Nurse notified.     ADL  Grooming: Wash/dry face;Brushing hair;Min guard Where Assessed - Grooming: Supported standing Upper Body Bathing: Set up;Supervision/safety Where Assessed - Upper Body Bathing: Supported sitting Lower Body Bathing: Min guard Where Assessed - Lower Body Bathing: Supported sit to Pharmacist, hospital: Minimal Dentist Method: Sit to stand Toilet Transfer Equipment: Raised toilet seat with arms (or 3-in-1 over toilet);Comfort height toilet;Grab bars Toileting - Clothing Manipulation and Hygiene: Moderate assistance Where Assessed - Toileting Clothing Manipulation and Hygiene: Sit on 3-in-1 or toilet;Standing (clothing-standing and hygiene-sitting) Equipment Used: Gait belt;Rolling  walker Transfers/Ambulation Related to ADLs: Walked without walker initially-very unstead, so then used walker and provided cues and assistance for use/safety with walker ADL Comments: Pt ambulated to bathroom and performed toileting tasks. Pt then performed grooming at sink. Pt seemed fatigued but able to perform tasks and then walk in hallway with OT. Pt bathed off sitting on 3 in 1.  Cues for pt to wash soap off of face.     OT Diagnosis:    OT Problem List:   OT Treatment Interventions:     OT Goals(current goals can now be found in the care plan section) Acute Rehab OT Goals Patient Stated Goal: none stated OT Goal Formulation: With family Time For Goal Achievement: 09/24/13 Potential to Achieve Goals: Good ADL Goals Pt Will Perform Grooming: standing;with set-up;with supervision Pt Will Perform Upper Body Bathing: with modified independence;sitting Pt Will Transfer to Toilet: with min assist;regular height toilet  Visit Information  Last OT Received On: 09/13/13 Assistance Needed: +1 History of Present Illness: Patient is a 79 y/o female admitted with abdominal pain, diarrhea and vomiting positive for SBO.  PMH positive for hypertension, A. fib on chronic anticoagulation, chronic low back pain, mild dementia, hypertension, status post pacemaker, arthritis, GERD, and scoliosis     Subjective Data      Prior Functioning       Cognition  Cognition Arousal/Alertness: Awake/alert Behavior During Therapy: WFL for tasks assessed/performed Overall Cognitive Status: History of cognitive impairments - at baseline    Mobility  Bed Mobility Bed Mobility: Supine to Sit;Sitting - Scoot to Edge of Bed Supine to Sit: 4: Min assist Sitting - Scoot to Edge of Bed: 4: Min guard Details for Bed Mobility Assistance: Helped with trunk and minimally with legs. Transfers Transfers: Sit to Stand;Stand to Sit Sit to Stand: 4: Min assist;From bed;From  chair/3-in-1;From toilet Stand to Sit: 4:  Min assist;4: Min guard;To chair/3-in-1;To toilet Details for Transfer Assistance: Cues and assist with hand placement and cues for technique.    Exercises      Balance     End of Session OT - End of Session Equipment Utilized During Treatment: Gait belt;Rolling walker Activity Tolerance: Patient limited by fatigue Patient left: in chair;with call bell/phone within reach;with family/visitor present Nurse Communication: Other (comment) (pt sitting in chair)  GO     Earlie Raveling OTR/L [phone] 09/13/2013, 4:51 PM

## 2013-09-13 NOTE — Progress Notes (Signed)
I have seen and examined the pt and agree with PA-Osborne's progress note. Having bowel function Adv diet

## 2013-09-13 NOTE — Progress Notes (Signed)
[facility] PROGRESS NOTE  Heather [name] ZOX:[id number] DOB: September 15, 1933 DOA: 09/08/2013 PCP: Leo Grosser, MD  Assessment/Plan: 1. Small bowel obstruction. Repeat abdominal x-ray showing stable small bowel obstruction with no change from yesterday's study. Diet advanced to full liquid. Surgery following, will continue conservative management. 2. Paroxysmal atrial fibrillation, status post pacemaker implant. Continue calcium channel blocker therapy and sotalol.   3. Suspected aspiration pneumonitis. Patient presented with multiple episodes of nausea and vomiting. Imaging studies revealed right-sided infiltrate. Will transition to oral antibiotic therapy with Augmentin..  4. Hypertension. Blood pressures improved, patient having her last blood pressure 136/65. Patient is on losartan and diazepam 5. Anticoagulation. INR was supratherapeutic yesterday for which 5 mg of vitamin K was administered. INR now within therapeutic range at 2.45, pharmacy following.  6. Hypokalemia. Potassium, down to 3.2 yesterday for which she received IV potassium. Improved on this mornings lab work to 3 point 7. Acute renal failure. Improved after receiving IV fluids, likely secondary to prerenal azotemia.    Code Status: Full code Family Communication: Plan discussed with patient's husband present at bedside    Consultants:  General surgery   Antibiotics:  Zosyn (09/09/2013)  HPI/Subjective: We ambulated patient around her room today which she fairly tolerated. Patient complaining of lower back pain as well as ongoing abdominal discomfort.  Objective: Filed Vitals:   09/13/13 0605  BP: 136/65  Pulse: 92  Temp: 97.6 F (36.4 C)  Resp: 18    Intake/Output Summary (Last 24 hours) at 09/13/13 1221 Last data filed at 09/13/13 0900  Gross per 24 hour  Intake   2075 ml  Output    100 ml  Net   1975 ml   Filed Weights   09/09/13 0528 09/12/13 0520 09/13/13 0605  Weight: 60 kg (132 lb  4.4 oz) 60.3 kg (132 lb 15 oz) 61.4 kg (135 lb 5.8 oz)    Exam:   General:  Patient appearing comfortable, in no acute distress. She is arousable however with history of advanced dementia, not responding to my questions.  Cardiovascular: Irregular rate and rhythm normal S1-S2 tachycardic  Respiratory: Patient having right sided crackles, normal respiratory effort of  Abdomen: Patient did not report significant pain to palpation across her abdomen. Her abdomen was soft, nondistended, no palpable masses.  Musculoskeletal: Present range of motion to all extremity  Extremities: No edema  Data Reviewed: Basic Metabolic Panel:  Recent Labs Lab 09/08/13 1245 09/09/13 0628 09/10/13 0720 09/12/13 0600 09/13/13 0615  NA 143 141 142 142 139  K 3.6 3.5 3.5 3.2* 3.7  CL 107 108 106 105 106  CO2 26 23 25 23 22   GLUCOSE 114* 114* 78 84 96  BUN 28* 23 21 15 10   CREATININE 1.61* 1.25* 1.21* 1.01 0.91  CALCIUM 8.9 8.6 8.5 8.4 8.5   Liver Function Tests:  Recent Labs Lab 09/08/13 1245  AST 17  ALT 10  ALKPHOS 91  BILITOT 0.4  PROT 6.5  ALBUMIN 3.3*   No results found for this basename: LIPASE, AMYLASE,  in the last 168 hours No results found for this basename: AMMONIA,  in the last 168 hours CBC:  Recent Labs Lab 09/08/13 1245 09/09/13 0628 09/12/13 0600 09/13/13 0615  WBC 9.5 5.9 6.0 6.3  NEUTROABS 7.8*  --   --   --   HGB 11.6* 9.8* 8.9* 8.8*  HCT 33.6* 29.0* 26.2* 25.8*  MCV 84.8 85.3 84.2 84.6  PLT 206 165 192 196   Cardiac Enzymes: No  results found for this basename: CKTOTAL, CKMB, CKMBINDEX, TROPONINI,  in the last 168 hours BNP (last 3 results) No results found for this basename: PROBNP,  in the last 8760 hours CBG: No results found for this basename: GLUCAP,  in the last 168 hours  Recent Results (from the past 240 hour(s))  CULTURE, BLOOD (ROUTINE X 2)     Status: None   Collection Time    09/08/13 11:15 PM      Result Value Range Status   Specimen  Description BLOOD RIGHT ARM   Final   Special Requests BOTTLES DRAWN AEROBIC ONLY 6CC   Final   Culture  Setup Time     Final   Value: 09/09/2013 03:39     Performed at [facility]   Culture     Final   Value:        BLOOD CULTURE RECEIVED NO GROWTH TO DATE CULTURE WILL BE HELD FOR 5 DAYS BEFORE ISSUING A FINAL NEGATIVE REPORT     Performed at [facility]   Report Status PENDING   Incomplete  CULTURE, BLOOD (ROUTINE X 2)     Status: None   Collection Time    09/09/13  1:05 AM      Result Value Range Status   Specimen Description BLOOD RIGHT ARM   Final   Special Requests BOTTLES DRAWN AEROBIC AND ANAEROBIC 10CC EACH   Final   Culture  Setup Time     Final   Value: 09/09/2013 10:28     Performed at [facility]   Culture     Final   Value:        BLOOD CULTURE RECEIVED NO GROWTH TO DATE CULTURE WILL BE HELD FOR 5 DAYS BEFORE ISSUING A FINAL NEGATIVE REPORT     Performed at [facility]   Report Status PENDING   Incomplete     Studies: Dg Abd 2 Views  09/13/2013   CLINICAL DATA:  Abdominal distention.  EXAM: ABDOMEN - 2 VIEW  COMPARISON:  09/11/2013  FINDINGS: Mild diffuse gaseous distention of bowel, predominantly small bowel. No real change since prior study, suggesting small bowel obstruction pattern. No free air organomegaly. Postoperative changes in the lumbar spine.  IMPRESSION: Stable small bowel distention.   Electronically Signed   By: Charlett Nose M.D.   On: 09/13/2013 07:29   Dg Abd 2 Views  09/11/2013   CLINICAL DATA:  Followup small bowel obstruction.  EXAM: ABDOMEN - 2 VIEW  COMPARISON:  09/10/2013.  FINDINGS: Distended loops of small bowel are again noted in the central abdomen. There is less small bowel distention than there was on the previous day study. Air-fluid levels are noted on the erect view. There is stool within a nondistended colon.  No free air.  IMPRESSION: There is less small bowel dilation than on the previous day's study,  suggesting mild improvement in the degree of small bowel obstruction. The partial small bowel obstruction, persists, however. No free air.   Electronically Signed   By: Amie Portland M.D.   On: 09/11/2013 13:34    Scheduled Meds: . antiseptic oral rinse  15 mL Mouth Rinse q12n4p  . chlorhexidine  15 mL Mouth Rinse BID  . colchicine  0.6 mg Oral Daily  . diltiazem  60 mg Oral Q6H  . losartan  100 mg Oral Daily  . memantine  10 mg Oral BID  . omega-3 acid ethyl esters  2 g Oral Daily  .  pantoprazole  80 mg Oral Q1200  . piperacillin-tazobactam (ZOSYN)  IV  3.375 g Intravenous Q8H  . sodium chloride  3 mL Intravenous Q12H  . sotalol  80 mg Oral BID  . warfarin  1 mg Oral ONCE-1800  . Warfarin - Pharmacist Dosing Inpatient   Does not apply q1800   Continuous Infusions: . sodium chloride 75 mL/hr at 09/13/13 0747    Principal Problem:   Small bowel obstruction Active Problems:   Hyperlipidemia   Hypertension   Diverticulosis   Atrial fibrillation, paroxysmal   Pacemaker dual chamber implanted August 2009 for sinus node dysfunction [employer] Enrhythm   Diastolic dysfunction   Abdominal pain   Nausea and vomiting   Diarrhea   CKD (chronic kidney disease), stage III   Dementia    Time spent: 35 minutes    Jeralyn Bennett  [facility] Pager [phone]. If 7PM-7AM, please contact night-coverage at [email or web address], password [facility] 09/13/2013, 12:21 PM  LOS: 5 days

## 2013-09-13 NOTE — Progress Notes (Signed)
Assisted pt up to [facility]- pt passed gas, passed a very small BM and urine appeared to be cloudy and pink in color

## 2013-09-14 LAB — CBC
HCT: 24.8 % — ABNORMAL LOW (ref 36.0–46.0)
MCHC: 33.9 g/dL (ref 30.0–36.0)
Platelets: 175 10*3/uL (ref 150–400)
RDW: 14.4 % (ref 11.5–15.5)

## 2013-09-14 LAB — BASIC METABOLIC PANEL
BUN: 10 mg/dL (ref 6–23)
CO2: 23 mEq/L (ref 19–32)
GFR calc Af Amer: 52 mL/min — ABNORMAL LOW (ref >90)
GFR calc non Af Amer: 45 mL/min — ABNORMAL LOW (ref >90)
Potassium: 3.8 mEq/L (ref 3.5–5.1)
Sodium: 143 mEq/L (ref 135–145)

## 2013-09-14 LAB — PROTIME-INR
INR: 2.67 — ABNORMAL HIGH (ref 0.00–1.49)
Prothrombin Time: 27.5 seconds — ABNORMAL HIGH (ref 11.6–15.2)

## 2013-09-14 MED ORDER — MORPHINE SULFATE 2 MG/ML IJ SOLN
1.0000 mg | INTRAMUSCULAR | Status: DC | PRN
  Administered 2013-09-14 (×2): 1 mg via INTRAVENOUS
  Filled 2013-09-14 (×2): qty 1

## 2013-09-14 MED ORDER — ENSURE COMPLETE PO LIQD
237.0000 mL | Freq: Two times a day (BID) | ORAL | Status: DC
  Administered 2013-09-14 – 2013-09-16 (×5): 237 mL via ORAL

## 2013-09-14 MED ORDER — WARFARIN 0.5 MG HALF TABLET
0.5000 mg | ORAL_TABLET | Freq: Once | ORAL | Status: AC
  Administered 2013-09-14: 0.5 mg via ORAL
  Filled 2013-09-14: qty 1

## 2013-09-14 NOTE — Progress Notes (Signed)
ANTICOAGULATION CONSULT NOTE - Follow Up Consult  Pharmacy Consult for Coumadin Indication: atrial fibrillation  Allergies  Allergen Reactions  . Oxycodone Hcl Other (See Comments)    HALLUCINATIONS  . Prednisone Other (See Comments)    Hallucinations  . Aspirin Other (See Comments)    GI UPSET  . Codeine Other (See Comments)    GI UPSET  . Sulfa Antibiotics Other (See Comments)    GI UPSET    Patient Measurements: Height: 5\' 3"  (160 cm) Weight: 141 lb 15.6 oz (64.4 kg) IBW/kg (Calculated) : 52.4 Heparin Dosing Weight:    Vital Signs: Temp: 97.4 F (36.3 C) (10/14 0555) Temp src: Oral (10/14 0555) BP: 140/74 mmHg (10/14 0555) Pulse Rate: 85 (10/14 0555)  Labs:  Recent Labs  09/12/13 0600 09/13/13 0615 09/14/13 0500  HGB 8.9* 8.8* 8.4*  HCT 26.2* 25.8* 24.8*  PLT 192 196 175  LABPROT 51.8* 25.8* 27.5*  INR 6.12* 2.45* 2.67*  CREATININE 1.01 0.91 1.14*    Estimated Creatinine Clearance: 36.7 ml/min (by C-G formula based on Cr of 1.14).  Assessment: 79 yo female with hx of Afib admitted with high grade SBO with adhesion. Having coffee ground emesis and dark stools prior to admission.  PMH: HLD, gout, afib, dementia, HTN, OA, GERD, seizures  AC: Warfarin for afib - INR 2.67 today, no bleeding noted (PTA dose 1mg  MWFSa and 2mg  TTSun with admit INR 2.29) - MD ordered vitamin K 5mg  PO 10/12.  ID: s/p abx for asp PNA - Tmax 99.2, WBC 7.1 Zosyn 10/9 >>10/13 CTX 10/9 >> 10/9 Azith 10/9 >> 10/9  10/9 Blood - pending  CV: Hx HTN, HLD, afib - VSS - dilt, losartan, lovaza, sotalol. CHADS2=2.  Endo: No hx - Glucose 100, colchicine  GI/Nutr: Hx GERD - Plan conservative treatment for now for SBO, LFTs WNL, po PPI, passing flatus, +BS, +BM, SBO slowly improving. Plan to advance to full diet  Neuro: Hx dementia, seizures, chronic pain - Namenda. Can become agitated.  Renal: Scr 0.91now up to 1.14 today  Heme/Onc: Hgb down to 8.4  Best Practices: Coumadin, SCD,  PPI, MC  Goal of Therapy:  INR 2-3 Monitor platelets by anticoagulation protocol: Yes   Plan:  Coumadin 0.5mg  po x 1 today  Colton Tassin S. Merilynn Finland, PharmD, BCPS Clinical Staff Pharmacist Pager [phone]  Misty Stanley Stillinger 09/14/2013,10:43 AM

## 2013-09-14 NOTE — Progress Notes (Signed)
I have seen and examined the pt and agree with PA-Dort's progress note. SBO resolved Call with questions

## 2013-09-14 NOTE — Progress Notes (Signed)
Physical Therapy Treatment Patient Details Name: DEMARIE UHLIG MRN: [id number] DOB: 1933/02/26 Today's Date: 09/14/2013 Time: 4098-1191 PT Time Calculation (min): 20 min  PT Assessment / Plan / Recommendation  History of Present Illness Patient is a 79 y/o female admitted with abdominal pain, diarrhea and vomiting positive for SBO.  PMH positive for hypertension, A. fib on chronic anticoagulation, chronic low back pain, mild dementia, hypertension, status post pacemaker, arthritis, GERD, and scoliosis    PT Comments   Patient demonstrates significant functional decline since previous session yesterday. Patient unable to performed any activities without increased assist. Ambulation requires +2 assist today and could not reach distance met in prior session. If patient continues to decline, may need to consider ST SNF upon discharge. Will continue to work with patient and progress activity as tolerated.  Follow Up Recommendations  Home health PT;Supervision/Assistance - 24 hour (may need to consider SNF based on function today)           Equipment Recommendations  Rolling walker with 5" wheels       Frequency Min 3X/week   Progress towards PT Goals Progress towards PT goals: Not progressing toward goals - comment  Plan Current plan remains appropriate    Precautions / Restrictions Precautions Precautions: Fall Restrictions Weight Bearing Restrictions: No   Pertinent Vitals/Pain No pain at this time, SpO2 on rm air 92%    Mobility  Bed Mobility Bed Mobility: Supine to Sit;Sitting - Scoot to Edge of Bed Supine to Sit: 3: Mod assist Sitting - Scoot to Edge of Bed: 4: Min assist Details for Bed Mobility Assistance: Increased assist needed today Transfers Transfers: Sit to Stand;Stand to Sit Sit to Stand: 3: Mod assist Stand to Sit: 3: Mod assist Details for Transfer Assistance: patient assisted to upright, very unsteady and decreased functional transfers compared to prior  sessions Ambulation/Gait Ambulation/Gait Assistance: 1: +2 Total assist Ambulation/Gait: Patient Percentage: 60% Ambulation Distance (Feet): 70 Feet Assistive device: 2 person hand held assist Ambulation/Gait Assistance Details: patient with decline in ambulation ability, very unsteady and limited with activity tolerance. 2 person assist required today Gait Pattern: Step-through pattern;Decreased stride length;Wide base of support Gait velocity: decreased     PT Goals (current goals can now be found in the care plan section) Acute Rehab PT Goals Patient Stated Goal: none state PT Goal Formulation: With patient/family Time For Goal Achievement: 09/24/13 Potential to Achieve Goals: Fair  Visit Information  Last PT Received On: 09/14/13 Assistance Needed: +1 History of Present Illness: Patient is a 79 y/o female admitted with abdominal pain, diarrhea and vomiting positive for SBO.  PMH positive for hypertension, A. fib on chronic anticoagulation, chronic low back pain, mild dementia, hypertension, status post pacemaker, arthritis, GERD, and scoliosis     Subjective Data  Patient Stated Goal: none state   Cognition  Cognition Arousal/Alertness: Awake/alert Behavior During Therapy: WFL for tasks assessed/performed Overall Cognitive Status: History of cognitive impairments - at baseline    Balance  Balance Balance Assessed: Yes Static Standing Balance Static Standing - Balance Support: During functional activity;No upper extremity supported Static Standing - Level of Assistance: 3: Mod assist Static Standing - Comment/# of Minutes: patient unable to maintain static standing without assist  End of Session PT - End of Session Equipment Utilized During Treatment: Gait belt Activity Tolerance: Patient limited by fatigue Patient left: in chair;with call bell/phone within reach;with family/visitor present Nurse Communication: Mobility status   GP     Fabio Asa 09/14/2013,  3:22  PM Alben Deeds, [place] DPT  [phone]

## 2013-09-14 NOTE — Progress Notes (Signed)
Subjective: Pt pleasantly demented.  Not able to tell me if she's in pain.  NG out.  Tolerating full liquids, although appetite is low.  Husband at bedside seems to think she's better.  Objective: Vital signs in last 24 hours: Temp:  [97.4 F (36.3 C)-99.2 F (37.3 C)] 97.4 F (36.3 C) (10/14 0555) Pulse Rate:  [85-92] 85 (10/14 0555) Resp:  [18] 18 (10/14 0555) BP: (140-179)/(74-81) 140/74 mmHg (10/14 0555) SpO2:  [93 %-96 %] 96 % (10/14 0555) Weight:  [141 lb 15.6 oz (64.4 kg)] 141 lb 15.6 oz (64.4 kg) (10/14 0555) Last BM Date: 09/11/13  Intake/Output from previous day: 2023/09/28 0701 - 10/14 0700 In: 1590 [P.O.:240; I.V.:1350] Out: 100 [Urine:100] Intake/Output this shift:    PE: Gen:  Alert, NAD, pleasant Abd: Soft, NT/ND, +BS, no HSM   Lab Results:   Recent Labs  09-27-13 0615 09/14/13 0500  WBC 6.3 7.1  HGB 8.8* 8.4*  HCT 25.8* 24.8*  PLT 196 175   BMET  Recent Labs  Sep 27, 2013 0615 09/14/13 0500  NA 139 143  K 3.7 3.8  CL 106 111  CO2 22 23  GLUCOSE 96 100*  BUN 10 10  CREATININE 0.91 1.14*  CALCIUM 8.5 8.2*   PT/INR  Recent Labs  2013/09/27 0615 09/14/13 0500  LABPROT 25.8* 27.5*  INR 2.45* 2.67*   CMP     Component Value Date/Time   NA 143 09/14/2013 0500   K 3.8 09/14/2013 0500   CL 111 09/14/2013 0500   CO2 23 09/14/2013 0500   GLUCOSE 100* 09/14/2013 0500   BUN 10 09/14/2013 0500   CREATININE 1.14* 09/14/2013 0500   CALCIUM 8.2* 09/14/2013 0500   PROT 6.5 09/08/2013 1245   ALBUMIN 3.3* 09/08/2013 1245   AST 17 09/08/2013 1245   ALT 10 09/08/2013 1245   ALKPHOS 91 09/08/2013 1245   BILITOT 0.4 09/08/2013 1245   GFRNONAA 45* 09/14/2013 0500   GFRAA 52* 09/14/2013 0500   Lipase     Component Value Date/Time   LIPASE 34 08/04/2010 1331       Studies/Results: Dg Abd 2 Views  09-27-2013   CLINICAL DATA:  Abdominal distention.  EXAM: ABDOMEN - 2 VIEW  COMPARISON:  09/11/2013  FINDINGS: Mild diffuse gaseous distention of bowel,  predominantly small bowel. No real change since prior study, suggesting small bowel obstruction pattern. No free air organomegaly. Postoperative changes in the lumbar spine.  IMPRESSION: Stable small bowel distention.   Electronically Signed   By: Charlett Nose M.D.   On: 2013-09-27 07:29    Anti-infectives: Anti-infectives   Start     Dose/Rate Route Frequency Ordered Stop   Sep 27, 2013 1330  amoxicillin-clavulanate (AUGMENTIN) 875-125 MG per tablet 1 tablet     1 tablet Oral Every 12 hours Sep 27, 2013 1226     09/09/13 1430  piperacillin-tazobactam (ZOSYN) IVPB 3.375 g  Status:  Discontinued     3.375 g 12.5 mL/hr over 240 Minutes Intravenous 3 times per day 09/09/13 1348 2013/09/27 1226   09/08/13 2230  azithromycin (ZITHROMAX) 500 mg in dextrose 5 % 250 mL IVPB  Status:  Discontinued     500 mg 250 mL/hr over 60 Minutes Intravenous Every 24 hours 09/08/13 2108 09/09/13 1342   09/08/13 2200  cefTRIAXone (ROCEPHIN) 1 g in dextrose 5 % 50 mL IVPB  Status:  Discontinued     1 g 100 mL/hr over 30 Minutes Intravenous Every 24 hours 09/08/13 2108 09/09/13 1342  Assessment/Plan High grade SBO 2* adhesions (appendectomy, abd hysterectomy) - improved Aspiration PNA AFIB with pacemaker  HTN/HLD  Dementia  GERD & hiatal hernia  H/o seizures 2009 prior to pacemaker   1. Much improved, still having agitation and confusion issues 2. Advance to [facility] diet at lunch 3. SCD's and currently on warfarin per pharmacy dosing   4. Mobilize and IS 5. Seeing as her SBO is now resolved and she is eating and having BM's, will sign off, call with questions concerns      LOS: 6 days    DORT, Kadeja Granada 09/14/2013, 8:11 AM Pager: [phone]

## 2013-09-14 NOTE — Progress Notes (Addendum)
Dr/NP notified regarding pt status (restless, irritable, anxious, increased B/P).  Ativan ordered and given. Will continue to monitor.

## 2013-09-14 NOTE — Clinical Social Work Note (Signed)
CSW attempted to speak with pt's husband, Maddelyn Rocca [phone]), regarding pt's discharge disposition once medically discharged from [facility]. CSW will continue to follow and assist with discharge planning needs.  Darlyn Chamber, LCSWA 6N/3S Clinical Social Worker [phone]

## 2013-09-14 NOTE — Progress Notes (Signed)
NUTRITION FOLLOW UP  Intervention:   1.  General healthful diet; encourage intake as able. Pt requires assistance with meals. 2.  Supplements; Ensure Complete po BID, each supplement provides 350 kcal and 13 grams of protein.  Nutrition Dx:   Inadequate oral intake, ongoing.   Monitor:   1.  Food/Beverage; pt meeting >/=90% estimated needs with tolerance. well tolerated 2.  Wt/wt change; monitor trends  Assessment:   RD met with husband at bedside who reports pt ate a few bites for dinner last night, and a pudding cup this morning which is significant improvement since admission.  Husband is hopeful this is the start of her recovery.  Pt is to receive Heart Healthy diet today for lunch.  Pt is sleeping at time of visit.  Husband states she is "getting rowdy" at night and sleeping during the day.  Pt requires assistance with meals.  RD to order supplements for patient- will request RN use with med administration.   Height: Ht Readings from Last 1 Encounters:  09/09/13 5\' 3"  (1.6 m)    Weight Status:   Wt Readings from Last 1 Encounters:  09/14/13 141 lb 15.6 oz (64.4 kg)    Re-estimated needs:  Kcal: 1400-1600 Protein: 60-70g Fluid: >1.6 L/day  Skin: generalized edema, no other issues noted  Diet Order: Cardiac   Intake/Output Summary (Last 24 hours) at 09/14/13 1129 Last data filed at 09/14/13 0500  Gross per 24 hour  Intake   1590 ml  Output      0 ml  Net   1590 ml    Last BM: 10/11   Labs:   Recent Labs Lab 09/12/13 0600 09/13/13 0615 09/14/13 0500  NA 142 139 143  K 3.2* 3.7 3.8  CL 105 106 111  CO2 23 22 23   BUN 15 10 10   CREATININE 1.01 0.91 1.14*  CALCIUM 8.4 8.5 8.2*  GLUCOSE 84 96 100*    CBG (last 3)  No results found for this basename: GLUCAP,  in the last 72 hours  Scheduled Meds: . amoxicillin-clavulanate  1 tablet Oral Q12H  . antiseptic oral rinse  15 mL Mouth Rinse q12n4p  . chlorhexidine  15 mL Mouth Rinse BID  . colchicine   0.6 mg Oral Daily  . diltiazem  60 mg Oral Q6H  . losartan  100 mg Oral Daily  . memantine  10 mg Oral BID  . omega-3 acid ethyl esters  2 g Oral Daily  . pantoprazole  80 mg Oral Q1200  . sodium chloride  3 mL Intravenous Q12H  . sotalol  80 mg Oral BID  . warfarin  0.5 mg Oral ONCE-1800  . Warfarin - Pharmacist Dosing Inpatient   Does not apply q1800    Continuous Infusions: . sodium chloride 75 mL/hr at 09/13/13 0747    Loyce Dys, MS RD LDN Clinical Inpatient Dietitian Pager: [phone] Weekend/After hours pager: [phone]

## 2013-09-14 NOTE — Progress Notes (Signed)
[facility] PROGRESS NOTE  Heather [name] YQI:[id number] DOB: [birth year] DOA: 09/08/2013 PCP: Lynnea Ferrier TOM, MD   In brief, patient is a pleasant 79 year old female with a past medical history of advanced dementia, age of fibrillation on chronic anticoagulation, who was admitted to the medicine service on 09/08/2013. She presented with complaints of abdominal pain, nausea vomiting and diarrhea. A CT scan of abdomen and pelvis done on admission showed a high-grade small bowel obstruction with a small amount of associated free fluid but no free air. There is also a patchy infiltrate and consolidation in the dependent right middle and lower lobes, concerning for aspiration. General surgery was consulted he recommended admission to the medicine service with surgical consultation. Initially she was managed conservatively, keeping patient n.p.o., NG tube placement for decompression, IV fluids and supportive care. Cardiology was consulted for cardiac clearance and eventually required surgical intervention. She showed slow but gradual improvement. Abdominal x-rays were repeated during this hospitalization to followup on small bowel obstruction. She has a history of abdominal surgeries including appendectomy and abdominal hysterectomy which has resulted in adhesions leading to small bowel obstruction. Physical therapy has been working with her during this hospitalization as she and related down the hallway and back yesterday. She is taking small amounts of food liquids today, will continue monitoring, advance diet, anticipate discharge in the next several days.  Assessment/Plan: 1. Small bowel obstruction. Patient tolerating a full liquid diet, plan to advance her diet as tolerated. 2. Paroxysmal atrial fibrillation, status post pacemaker implant. Continue calcium channel blocker therapy and sotalol.   3. Suspected aspiration pneumonia. Continue oral Augmentin therapy, stable from a respiratory  standpoint.  4. Hypertension. Blood pressures are stable with her last blood pressure 140/70. Continue losartan and diltiazem 5. Anticoagulation. Patient's INR is supratherapeutic during this hospitalization for which he received 5 mg of vitamin K, this morning his INR therapeutic at 2.67. We'll continue following pharmacy's recommendations 6. Hypokalemia. Resolved 7. Acute renal failure. Improved after receiving IV fluids, likely secondary to prerenal azotemia.  Continue encouraging by mouth intake  Code Status: Full code Family Communication: Plan discussed with patient's husband present at bedside    Consultants:  General surgery  Cardiology   Antibiotics:  Zosyn discontinued on 09/13/2013  Augmentin    HPI/Subjective: Patient ambulated with physical therapy yesterday. She seems to be tolerating full liquids a little better today. History of advanced dementia, poor historian. Husband present at bedside was updated, questions answered.   Objective: Filed Vitals:   09/14/13 0555  BP: 140/74  Pulse: 85  Temp: 97.4 F (36.3 C)  Resp: 18    Intake/Output Summary (Last 24 hours) at 09/14/13 1254 Last data filed at 09/14/13 0500  Gross per 24 hour  Intake   1590 ml  Output      0 ml  Net   1590 ml   Filed Weights   09/12/13 0520 09/13/13 0605 09/14/13 0555  Weight: 60.3 kg (132 lb 15 oz) 61.4 kg (135 lb 5.8 oz) 64.4 kg (141 lb 15.6 oz)    Exam:   General:  Patient appearing comfortable, in no acute distress. She is arousable however with history of advanced dementia, not responding to my questions.  Cardiovascular: Irregular rate and rhythm normal S1-S2 tachycardic  Respiratory: Patient having right sided crackles, normal respiratory effort of  Abdomen: Patient did not report significant pain to palpation across her abdomen. Her abdomen was soft, nondistended, no palpable masses.  Musculoskeletal: Present range of motion to all extremity  Extremities: No  edema  Data Reviewed: Basic Metabolic Panel:  Recent Labs Lab 09/09/13 0628 09/10/13 0720 09/12/13 0600 09/13/13 0615 09/14/13 0500  NA 141 142 142 139 143  K 3.5 3.5 3.2* 3.7 3.8  CL 108 106 105 106 111  CO2 23 25 23 22 23   GLUCOSE 114* 78 84 96 100*  BUN 23 21 15 10 10   CREATININE 1.25* 1.21* 1.01 0.91 1.14*  CALCIUM 8.6 8.5 8.4 8.5 8.2*   Liver Function Tests:  Recent Labs Lab 09/08/13 1245  AST 17  ALT 10  ALKPHOS 91  BILITOT 0.4  PROT 6.5  ALBUMIN 3.3*   No results found for this basename: LIPASE, AMYLASE,  in the last 168 hours No results found for this basename: AMMONIA,  in the last 168 hours CBC:  Recent Labs Lab 09/08/13 1245 09/09/13 0628 09/12/13 0600 09/13/13 0615 09/14/13 0500  WBC 9.5 5.9 6.0 6.3 7.1  NEUTROABS 7.8*  --   --   --   --   HGB 11.6* 9.8* 8.9* 8.8* 8.4*  HCT 33.6* 29.0* 26.2* 25.8* 24.8*  MCV 84.8 85.3 84.2 84.6 84.4  PLT 206 165 192 196 175   Cardiac Enzymes: No results found for this basename: CKTOTAL, CKMB, CKMBINDEX, TROPONINI,  in the last 168 hours BNP (last 3 results) No results found for this basename: PROBNP,  in the last 8760 hours CBG: No results found for this basename: GLUCAP,  in the last 168 hours  Recent Results (from the past 240 hour(s))  CULTURE, BLOOD (ROUTINE X 2)     Status: None   Collection Time    09/08/13 11:15 PM      Result Value Range Status   Specimen Description BLOOD RIGHT ARM   Final   Special Requests BOTTLES DRAWN AEROBIC ONLY 6CC   Final   Culture  Setup Time     Final   Value: 09/09/2013 03:39     Performed at [facility]   Culture     Final   Value:        BLOOD CULTURE RECEIVED NO GROWTH TO DATE CULTURE WILL BE HELD FOR 5 DAYS BEFORE ISSUING A FINAL NEGATIVE REPORT     Performed at [facility]   Report Status PENDING   Incomplete  CULTURE, BLOOD (ROUTINE X 2)     Status: None   Collection Time    09/09/13  1:05 AM      Result Value Range Status   Specimen  Description BLOOD RIGHT ARM   Final   Special Requests BOTTLES DRAWN AEROBIC AND ANAEROBIC 10CC EACH   Final   Culture  Setup Time     Final   Value: 09/09/2013 10:28     Performed at [facility]   Culture     Final   Value:        BLOOD CULTURE RECEIVED NO GROWTH TO DATE CULTURE WILL BE HELD FOR 5 DAYS BEFORE ISSUING A FINAL NEGATIVE REPORT     Performed at [facility]   Report Status PENDING   Incomplete     Studies: Dg Abd 2 Views  09/13/2013   CLINICAL DATA:  Abdominal distention.  EXAM: ABDOMEN - 2 VIEW  COMPARISON:  09/11/2013  FINDINGS: Mild diffuse gaseous distention of bowel, predominantly small bowel. No real change since prior study, suggesting small bowel obstruction pattern. No free air organomegaly. Postoperative changes in the lumbar spine.  IMPRESSION: Stable small bowel distention.  Electronically Signed   By: Charlett Nose M.D.   On: 09/13/2013 07:29    Scheduled Meds: . amoxicillin-clavulanate  1 tablet Oral Q12H  . antiseptic oral rinse  15 mL Mouth Rinse q12n4p  . chlorhexidine  15 mL Mouth Rinse BID  . colchicine  0.6 mg Oral Daily  . diltiazem  60 mg Oral Q6H  . feeding supplement (ENSURE COMPLETE)  237 mL Oral BID BM  . losartan  100 mg Oral Daily  . memantine  10 mg Oral BID  . omega-3 acid ethyl esters  2 g Oral Daily  . pantoprazole  80 mg Oral Q1200  . sodium chloride  3 mL Intravenous Q12H  . sotalol  80 mg Oral BID  . warfarin  0.5 mg Oral ONCE-1800  . Warfarin - Pharmacist Dosing Inpatient   Does not apply q1800   Continuous Infusions: . sodium chloride 75 mL/hr at 09/14/13 1130    Principal Problem:   Small bowel obstruction Active Problems:   Hyperlipidemia   Hypertension   Diverticulosis   Atrial fibrillation, paroxysmal   Pacemaker dual chamber implanted August 2009 for sinus node dysfunction Medtronic Enrhythm   Diastolic dysfunction   Abdominal pain   Nausea and vomiting   Diarrhea   CKD (chronic kidney disease),  stage III   Dementia    Time spent: 35 minutes    Jeralyn Bennett  [facility] Pager [phone]. If 7PM-7AM, please contact night-coverage at [email or web address], password [facility] 09/14/2013, 12:53 PM  LOS: 6 days

## 2013-09-15 LAB — CULTURE, BLOOD (ROUTINE X 2)
Culture: NO GROWTH
Culture: NO GROWTH

## 2013-09-15 LAB — PROTIME-INR: Prothrombin Time: 32.7 seconds — ABNORMAL HIGH (ref 11.6–15.2)

## 2013-09-15 NOTE — Progress Notes (Addendum)
  Patient 's son Bethann Berkshire called back  , patient does have a rolling walker at home , cancelled current order. They would like to use [facility] . Ronny Flurry RN BSN [phone]      Spoke with patient and her son at bedside .  Son states family plans to take her home at discharge and can provide 24 hour care . Provided son with list of home health agencies . Son with speak to patient's husband and let NCM know which agency they decide on.  Family does want rolling walker ordered , they do not want bedside commode.   Confirmed face sheet information with son .    Ronny Flurry RN BSN [phone]

## 2013-09-15 NOTE — Progress Notes (Signed)
CSW (Clinical Child psychotherapist) informed by [facility] that pt son wanting pt to dc home with HH. At this time, no further social work needs are identified. CSW signing off. Please reconsult if new needs arise.  Malyna Budney, LCSWA [phone]

## 2013-09-15 NOTE — Progress Notes (Signed)
[facility] PROGRESS NOTE  Heather [name] ZOX:[id number] DOB: 1933/09/23 DOA: 09/08/2013 PCP: Heather Ferrier TOM, MD  Assessment/Plan: In brief, patient is a pleasant 79 year old female with a past medical history of advanced dementia, age of fibrillation on chronic anticoagulation, who was admitted to the medicine service on 09/08/2013. She presented with complaints of abdominal pain, nausea vomiting and diarrhea. A CT scan of abdomen and pelvis done on admission showed a high-grade small bowel obstruction with a small amount of associated free fluid but no free air. There is also a patchy infiltrate and consolidation in the dependent right middle and lower lobes, concerning for aspiration. General surgery was consulted he recommended admission to the medicine service with surgical consultation. Initially she was managed conservatively, keeping patient n.p.o., NG tube placement for decompression, IV fluids and supportive care. Cardiology was consulted for cardiac clearance and eventually required surgical intervention. She showed slow but gradual improvement. Abdominal x-rays were repeated during this hospitalization to followup on small bowel obstruction. She has a history of abdominal surgeries including appendectomy and abdominal hysterectomy which has resulted in adhesions leading to small bowel obstruction.   1-Small bowel obstruction; resolved with medical management. Tolerating diet.   2-Paroxysmal atrial fibrillation status post pacemaker implant. Continue calcium channel blocker therapy and sotalol. Coumadin per pharmacy. INR at 3.   3-Hypertension; continue with Cardizem, cozaar, sotalol.   4-Suspected aspiration pneumonia. Continue oral Augmentin therapy, stable from a respiratory standpoint.   4-Hypokalemia. Resolved  5-Acute renal failure. Improved after receiving IV fluids, likely secondary to prerenal azotemia. Continue encouraging by mouth intake.   Code Status: presume full  code.  Family Communication: care discussed with patient.  Disposition Plan: home in 24 hours.    Consultants: General surgery Cardiology   Procedures:  none  Antibiotics:  Augmentin 10-13  HPI/Subjective: Eating, no worsening abdominal pain.   Objective: Filed Vitals:   09/15/13 1336  BP: 137/63  Pulse: 54  Temp: 99.9 F (37.7 C)  Resp: 16    Intake/Output Summary (Last 24 hours) at 09/15/13 1701 Last data filed at 09/15/13 1451  Gross per 24 hour  Intake 1979.25 ml  Output      0 ml  Net 1979.25 ml   Filed Weights   09/13/13 0605 09/14/13 0555 09/15/13 0604  Weight: 61.4 kg (135 lb 5.8 oz) 64.4 kg (141 lb 15.6 oz) 63.4 kg (139 lb 12.4 oz)    Exam:   General:  No distress.   Cardiovascular: S 1, S 2 RRR  Respiratory: CTA  Abdomen: BS present, soft, NT, NR  Musculoskeletal: no edema.   Data Reviewed: Basic Metabolic Panel:  Recent Labs Lab 09/09/13 0628 09/10/13 0720 09/12/13 0600 09/13/13 0615 09/14/13 0500  NA 141 142 142 139 143  K 3.5 3.5 3.2* 3.7 3.8  CL 108 106 105 106 111  CO2 23 25 23 22 23   GLUCOSE 114* 78 84 96 100*  BUN 23 21 15 10 10   CREATININE 1.25* 1.21* 1.01 0.91 1.14*  CALCIUM 8.6 8.5 8.4 8.5 8.2*   Liver Function Tests: No results found for this basename: AST, ALT, ALKPHOS, BILITOT, PROT, ALBUMIN,  in the last 168 hours No results found for this basename: LIPASE, AMYLASE,  in the last 168 hours No results found for this basename: AMMONIA,  in the last 168 hours CBC:  Recent Labs Lab 09/09/13 0628 09/12/13 0600 09/13/13 0615 09/14/13 0500  WBC 5.9 6.0 6.3 7.1  HGB 9.8* 8.9* 8.8* 8.4*  HCT 29.0* 26.2* 25.8*  24.8*  MCV 85.3 84.2 84.6 84.4  PLT 165 192 196 175   Cardiac Enzymes: No results found for this basename: CKTOTAL, CKMB, CKMBINDEX, TROPONINI,  in the last 168 hours BNP (last 3 results) No results found for this basename: PROBNP,  in the last 8760 hours CBG: No results found for this basename:  GLUCAP,  in the last 168 hours  Recent Results (from the past 240 hour(s))  CULTURE, BLOOD (ROUTINE X 2)     Status: None   Collection Time    09/08/13 11:15 PM      Result Value Range Status   Specimen Description BLOOD RIGHT ARM   Final   Special Requests BOTTLES DRAWN AEROBIC ONLY 6CC   Final   Culture  Setup Time     Final   Value: 09/09/2013 03:39     Performed at [facility]   Culture     Final   Value: NO GROWTH 5 DAYS     Performed at [facility]   Report Status 09/15/2013 FINAL   Final  CULTURE, BLOOD (ROUTINE X 2)     Status: None   Collection Time    09/09/13  1:05 AM      Result Value Range Status   Specimen Description BLOOD RIGHT ARM   Final   Special Requests BOTTLES DRAWN AEROBIC AND ANAEROBIC 10CC EACH   Final   Culture  Setup Time     Final   Value: 09/09/2013 10:28     Performed at [facility]   Culture     Final   Value: NO GROWTH 5 DAYS     Performed at [facility]   Report Status 09/15/2013 FINAL   Final     Studies: No results found.  Scheduled Meds: . amoxicillin-clavulanate  1 tablet Oral Q12H  . colchicine  0.6 mg Oral Daily  . diltiazem  60 mg Oral Q6H  . feeding supplement (ENSURE COMPLETE)  237 mL Oral BID BM  . losartan  100 mg Oral Daily  . memantine  10 mg Oral BID  . omega-3 acid ethyl esters  2 g Oral Daily  . pantoprazole  80 mg Oral Q1200  . sodium chloride  3 mL Intravenous Q12H  . sotalol  80 mg Oral BID  . Warfarin - Pharmacist Dosing Inpatient   Does not apply q1800   Continuous Infusions: . sodium chloride 75 mL/hr at 09/15/13 1657    Principal Problem:   Small bowel obstruction Active Problems:   Hyperlipidemia   Hypertension   Diverticulosis   Atrial fibrillation, paroxysmal   Pacemaker dual chamber implanted August 2009 for sinus node dysfunction Medtronic Enrhythm   Diastolic dysfunction   Abdominal pain   Nausea and vomiting   Diarrhea   CKD (chronic kidney disease), stage  III   Dementia    Time spent: 25 minutes.     Heather [name]  [facility] Pager [phone]. If 7PM-7AM, please contact night-coverage at [email or web address], password [facility] 09/15/2013, 5:01 PM  LOS: 7 days

## 2013-09-15 NOTE — Progress Notes (Signed)
ANTICOAGULATION CONSULT NOTE - Follow Up Consult  Pharmacy Consult for Coumadin Indication: atrial fibrillation  Allergies  Allergen Reactions  . Oxycodone Hcl Other (See Comments)    HALLUCINATIONS  . Prednisone Other (See Comments)    Hallucinations  . Aspirin Other (See Comments)    GI UPSET  . Codeine Other (See Comments)    GI UPSET  . Sulfa Antibiotics Other (See Comments)    GI UPSET    Patient Measurements: Height: 5\' 3"  (160 cm) Weight: 139 lb 12.4 oz (63.4 kg) IBW/kg (Calculated) : 52.4 Heparin Dosing Weight:    Vital Signs: Temp: 98.1 F (36.7 C) (10/15 0604) Temp src: Oral (10/15 0604) BP: 154/73 mmHg (10/15 0604) Pulse Rate: 99 (10/15 0604)  Labs:  Recent Labs  09/13/13 0615 09/14/13 0500 09/15/13 0435  HGB 8.8* 8.4*  --   HCT 25.8* 24.8*  --   PLT 196 175  --   LABPROT 25.8* 27.5* 32.7*  INR 2.45* 2.67* 3.35*  CREATININE 0.91 1.14*  --     Estimated Creatinine Clearance: 36.5 ml/min (by C-G formula based on Cr of 1.14).  Assessment: 79 yo female with hx of Afib admitted with high grade SBO with adhesion. Having coffee ground emesis and dark stools prior to admission.  PMH: HLD, gout, afib, dementia, HTN, OA, GERD, seizures  AC: Warfarin for afib - INR 3.35 today, no bleeding noted (PTA dose 1mg  MWFSa and 2mg  TTSun with admit INR 2.29) - MD ordered vitamin K 5mg  PO on 10/12. Husband states patient has always been very hard to regulate INR due to her not eating.  ID: s/p abx for asp PNA - Tmax 100.3, WBC 7.1 Zosyn 10/9 >>10/13 CTX 10/9 >> 10/9 Azith 10/9 >> 10/9 Augmentin 10/14>>  10/9 Blood - Negative  CV: Hx HTN, HLD, afib, PPM - 154/73, HR 99 - dilt, losartan, lovaza, sotalol. CHADS2=2.  Endo: No hx - Glucose 100, colchicine  GI/Nutr: Hx GERD - Plan conservative treatment for now for SBO, LFTs WNL, po PPI, SBO slowly improving. Plan to advance to full diet. Still not eating much. Likes Oreos and vanilla ice cream.  Neuro: Hx  dementia, seizures, chronic pain - Namenda. Can become agitated.  Renal: Scr 0.91 now up to 1.14   Heme/Onc: Hgb down to 8.4  Best Practices: Coumadin, SCD, PPI, MC  Goal of Therapy:  INR 2-3 Monitor platelets by anticoagulation protocol: Yes   Plan:  Hold Coumadin today  Aundraya Dripps S. Merilynn Finland, PharmD, [facility] Clinical Staff Pharmacist Pager [phone]  Misty Stanley Stillinger 09/15/2013,9:13 AM

## 2013-09-15 NOTE — Progress Notes (Signed)
CSW (Clinical Child psychotherapist) tried to speak with pt and pt family member who was at bedside about discharge plan. Family member was pt sister-in-law who reported she was only there to keep pt company and would not be able to assist. Pt sister-in-law told CSW that pt husband and/or son will be at the hospital later today and CSW should speak with them.  CSW to continue to follow and assist with discharge needs if appropriate.  Tinie Mcgloin, LCSWA [phone]

## 2013-09-16 LAB — BASIC METABOLIC PANEL
BUN: 6 mg/dL (ref 6–23)
CO2: 22 mEq/L (ref 19–32)
Chloride: 109 mEq/L (ref 96–112)
Creatinine, Ser: 0.8 mg/dL (ref 0.50–1.10)

## 2013-09-16 LAB — PROTIME-INR: Prothrombin Time: 29.4 seconds — ABNORMAL HIGH (ref 11.6–15.2)

## 2013-09-16 MED ORDER — WARFARIN SODIUM 1 MG PO TABS: ORAL_TABLET | ORAL | Status: DC

## 2013-09-16 MED ORDER — POLYETHYLENE GLYCOL 3350 17 G PO PACK: 17.0000 g | PACK | Freq: Every day | ORAL | Status: DC

## 2013-09-16 MED ORDER — SOTALOL HCL 80 MG PO TABS: 80.0000 mg | ORAL_TABLET | Freq: Two times a day (BID) | ORAL | Status: DC

## 2013-09-16 MED ORDER — POTASSIUM CHLORIDE CRYS ER 20 MEQ PO TBCR
40.0000 meq | EXTENDED_RELEASE_TABLET | Freq: Once | ORAL | Status: AC
  Administered 2013-09-16: 40 meq via ORAL
  Filled 2013-09-16: qty 2

## 2013-09-16 MED ORDER — SOTALOL HCL 80 MG PO TABS: 80.0000 mg | ORAL_TABLET | Freq: Every day | ORAL | Status: DC

## 2013-09-16 MED ORDER — AMOXICILLIN-POT CLAVULANATE 875-125 MG PO TABS: 1.0000 | ORAL_TABLET | Freq: Two times a day (BID) | ORAL | Status: DC

## 2013-09-16 NOTE — Care Management Note (Signed)
  Page 1 of 1   09/16/2013     9:53:08 AM   CARE MANAGEMENT NOTE 09/16/2013  Patient:  Heather [name], Heather [name]   Account Number:  [id number]  Date Initiated:  09/14/2013  Documentation initiated by:  Ronny Flurry  Subjective/Objective Assessment:     Action/Plan:   Anticipated DC Date:  09/16/2013   Anticipated DC Plan:  HOME W HOME HEALTH SERVICES         Choice offered to / List presented to:  C-4 Adult Children        HH arranged  HH-1 RN  HH-2 PT  HH-3 OT  HH-4 NURSE'S AIDE      HH agency  [facility]   Status of service:  Completed, signed off Medicare Important Message given?   (If response is "NO", the following Medicare IM given date fields will be blank) Date Medicare IM given:   Date Additional Medicare IM given:    Discharge Disposition:    Per UR Regulation:    If discussed at Long Length of Stay Meetings, dates discussed:    Comments:  09-14-13 Suggestion from [facility] referral . Discussed in progression with DR Vanessa Barbara . Patient doing much better today , no need for LTAC at this time . Ronny Flurry RN BSN [phone]

## 2013-09-16 NOTE — Progress Notes (Signed)
DC home with husband, verbally understood DC instructions, no questions ask 

## 2013-09-16 NOTE — Progress Notes (Signed)
ANTICOAGULATION CONSULT NOTE - Follow Up Consult  Pharmacy Consult for Coumadin Indication: atrial fibrillation  Allergies  Allergen Reactions  . Oxycodone Hcl Other (See Comments)    HALLUCINATIONS  . Prednisone Other (See Comments)    Hallucinations  . Aspirin Other (See Comments)    GI UPSET  . Codeine Other (See Comments)    GI UPSET  . Sulfa Antibiotics Other (See Comments)    GI UPSET    Patient Measurements: Height: 5\' 3"  (160 cm) Weight: 146 lb 2.6 oz (66.3 kg) IBW/kg (Calculated) : 52.4 Heparin Dosing Weight:    Vital Signs: Temp: 98.7 F (37.1 C) (10/16 0535) Temp src: Oral (10/16 0535) BP: 150/99 mmHg (10/16 0535) Pulse Rate: 106 (10/16 0535)  Labs:  Recent Labs  09/14/13 0500 09/15/13 0435 09/16/13 0526  HGB 8.4*  --   --   HCT 24.8*  --   --   PLT 175  --   --   LABPROT 27.5* 32.7* 29.4*  INR 2.67* 3.35* 2.91*  CREATININE 1.14*  --  0.80    Estimated Creatinine Clearance: 53.1 ml/min (by C-G formula based on Cr of 0.8).  Assessment: 79 yo female with hx of Afib admitted with high grade SBO with adhesion. Having coffee ground emesis and dark stools prior to admission.  PMH: HLD, gout, afib, dementia, HTN, OA, GERD, seizures  AC: Warfarin for afib - INR 2.91 down today, no bleeding noted (PTA dose 1mg  MWFSa and 2mg  TTSun with admit INR 2.29) - MD ordered vitamin K 5mg  PO on 10/12. Husband states patient has always been very hard to regulate INR due to her not eating.  ID: s/p abx for asp PNA - Tmax 100.2, WBC 7.1 Zosyn 10/9 >>10/13 CTX 10/9 >> 10/9 Azith 10/9 >> 10/9 Augmentin 10/14>>  10/9 Blood - Negative  CV: Hx HTN, HLD, afib, PPM - 150/99, HR 54-106 - dilt, losartan, lovaza, sotalol. CHADS2=2.  Endo: No hx - Glucose 87, colchicine  GI/Nutr: Hx GERD - Plan conservative treatment for now for SBO, LFTs WNL, po PPI, SBO slowly improving. Plan to advance to full diet. Still not eating much. Likes Oreos and vanilla ice cream.  Neuro: Hx  dementia, seizures, chronic pain - Namenda. Can become agitated.  Renal: Scr back down to 0.8  Heme/Onc: Hgb down to 8.4  Best Practices: Coumadin, SCD, PPI, MC  Goal of Therapy:  INR 2-3 Monitor platelets by anticoagulation protocol: Yes   Plan:  Continue to hold Coumadin one more day; super sensistive  Heather [name], PharmD, BCPS Clinical Staff Pharmacist Pager [phone]  Heather [name] 09/16/2013,9:32 AM

## 2013-09-16 NOTE — Progress Notes (Signed)
Physical Therapy Treatment Patient Details Name: Heather [name] MRN: [id number] DOB: Apr 26, 1933 Today's Date: 09/16/2013 Time: 4132-4401 PT Time Calculation (min): 18 min  PT Assessment / Plan / Recommendation  History of Present Illness Patient is a 79 y/o female admitted with abdominal pain, diarrhea and vomiting positive for SBO.  PMH positive for hypertension, A. fib on chronic anticoagulation, chronic low back pain, mild dementia, hypertension, status post pacemaker, arthritis, GERD, and scoliosis    PT Comments   Patient planning to DC home today with assistance from family. Patient needed encouragement to walk but evenetually agreeable. Husband stated that had everything needed for home and he felt safe taking her home at this level and assisting her as needed  Follow Up Recommendations  Home health PT;Supervision/Assistance - 24 hour     Does the patient have the potential to tolerate intense rehabilitation     Barriers to Discharge        Equipment Recommendations  Rolling walker with 5" wheels    Recommendations for Other Services    Frequency Min 3X/week   Progress towards PT Goals Progress towards PT goals: Progressing toward goals  Plan Current plan remains appropriate    Precautions / Restrictions Precautions Precautions: Fall   Pertinent Vitals/Pain no apparent distress    Mobility  Bed Mobility Supine to Sit: 6: Modified independent (Device/Increase time) Transfers Sit to Stand: 4: Min guard Stand to Sit: 4: Min guard Ambulation/Gait Ambulation/Gait Assistance: 4: Min assist Ambulation Distance (Feet): 80 Feet Assistive device: Rolling walker Ambulation/Gait Assistance Details: A with RW positioning Gait Pattern: Step-through pattern;Decreased stride length;Wide base of support Gait velocity: decreased    Exercises     PT Diagnosis:    PT Problem List:   PT Treatment Interventions:     PT Goals (current goals can now be found in the care plan  section)    Visit Information  Last PT Received On: 09/16/13 Assistance Needed: +1 History of Present Illness: Patient is a 79 y/o female admitted with abdominal pain, diarrhea and vomiting positive for SBO.  PMH positive for hypertension, A. fib on chronic anticoagulation, chronic low back pain, mild dementia, hypertension, status post pacemaker, arthritis, GERD, and scoliosis     Subjective Data      Cognition  Cognition Arousal/Alertness: Awake/alert Behavior During Therapy: WFL for tasks assessed/performed Overall Cognitive Status: History of cognitive impairments - at baseline    Balance     End of Session PT - End of Session Equipment Utilized During Treatment: Gait belt Activity Tolerance: Patient tolerated treatment well Patient left: in chair;with call bell/phone within reach   GP     Robinette, Heather [name] 09/16/2013, 1:41 PM 09/16/2013 Fredrich Birks PTA [phone] pager [phone] office

## 2013-09-16 NOTE — Discharge Summary (Signed)
Physician Discharge Summary  Heather [name] WUJ:[id number] DOB: 08-04-1933 DOA: 09/08/2013  PCP: Leo Grosser, MD  Admit date: 09/08/2013 Discharge date: 09/16/2013  Time spent: 35 minutes  Recommendations for Outpatient Follow-up:  1. Need B-met to follow K level.  2. INR to adjust coumadin doses.   Discharge Diagnoses:    Small bowel obstruction   Aspiration PNA.    Hyperlipidemia   Hypertension   Diverticulosis   Atrial fibrillation, paroxysmal   Pacemaker dual chamber implanted August 2009 for sinus node dysfunction    [employer] Enrhythm   Diastolic dysfunction   Acute on CKD (chronic kidney disease), stage III   Dementia   Discharge Condition: stable.   Diet recommendation: Heart Healthy  Filed Weights   09/14/13 0555 09/15/13 0604 09/16/13 0535  Weight: 64.4 kg (141 lb 15.6 oz) 63.4 kg (139 lb 12.4 oz) 66.3 kg (146 lb 2.6 oz)    History of present illness:  Heather [name] is a 79 y.o. Caucasian female with history of hypertension, A. fib on chronic anticoagulation, chronic low back pain, mild dementia, hypertension, status post pacemaker, arthritis, GERD, and scoliosis who presents with the above complaints. Patient due to her dementia cannot provide any history. Most of the history was obtained from her patient's husband at bedside. Patient symptoms started yesterday with abdominal pain. She had persistent abdominal pain during the night. This morning at 9 a.m. she started having diarrhea and then started vomiting. Husband noted that both her stools and emesis were coffee-ground in color. He contacted her primary care physician who instructed the patient come in to the emergency department for further evaluation. She denies any recent fevers, chills, chest pain, shortness of breath, headaches or vision changes. In the emergency department patient had imaging which showed high-grade small bowel obstruction. Imaging also showed patchy infiltrate and consolidation in the  dependent right middle and lower lobes concerning for aspiration. Hospitalist service was asked to admit the patient for further care and management.   Hospital Course:  In brief, patient is a pleasant 79 year old female with a past medical history of advanced dementia, age of fibrillation on chronic anticoagulation, who was admitted to the medicine service on 09/08/2013. She presented with complaints of abdominal pain, nausea vomiting and diarrhea. A CT scan of abdomen and pelvis done on admission showed a high-grade small bowel obstruction with a small amount of associated free fluid but no free air. There is also a patchy infiltrate and consolidation in the dependent right middle and lower lobes, concerning for aspiration. General surgery was consulted he recommended admission to the medicine service with surgical consultation. Initially she was managed conservatively, keeping patient n.p.o., NG tube placement for decompression, IV fluids and supportive care. Cardiology was consulted for cardiac clearance and eventually required surgical intervention. She showed slow but gradual improvement. Abdominal x-rays were repeated during this hospitalization to followup on small bowel obstruction. She has a history of abdominal surgeries including appendectomy and abdominal hysterectomy which has resulted in adhesions leading to small bowel obstruction.   1-Small bowel obstruction; resolved with medical management. Tolerating diet.  2-Paroxysmal atrial fibrillation status post pacemaker implant. Continue calcium channel blocker therapy and sotalol. Coumadin per pharmacy. INR at 2.9. Hold coumadin today. I have order HH to check INR 10-17, call result to PCP to determine next dose of coumadin.    3-Hypertension; continue with Cardizem, cozaar, sotalol.  4-Suspected aspiration pneumonia. Continue oral Augmentin therapy day 4. Will provide 3 more days., stable from a respiratory standpoint.  4-Hypokalemia.  Replete with 40 meq times 2 doses prior to discharge.   5-Acute renal failure. Improved after receiving IV fluids, likely secondary to prerenal azotemia. Continue encouraging by mouth intake.   Procedures:  none  Consultations:  Surgery  Discharge Exam: Filed Vitals:   09/16/13 0535  BP: 150/99  Pulse: 106  Temp: 98.7 F (37.1 C)  Resp: 18    General: no distress.  Cardiovascular: S 1, S 2 RRR Respiratory: CTA Abdomen: soft, NT, ND  Discharge Instructions  Discharge Orders   Future Appointments Provider Department Dept Phone   09/30/2013 10:30 AM Donita Brooks, MD [facility] [phone]   Future Orders Complete By Expires   Diet - low sodium heart healthy  As directed    Increase activity slowly  As directed        Medication List         amoxicillin-clavulanate 875-125 MG per tablet  Commonly known as:  AUGMENTIN  Take 1 tablet by mouth every 12 (twelve) hours.     colchicine 0.6 MG tablet  Take 0.6 mg by mouth daily.     cyclobenzaprine 10 MG tablet  Commonly known as:  FLEXERIL  Take 10 mg by mouth 3 (three) times daily as needed for muscle spasms.     diltiazem 240 MG 24 hr capsule  Commonly known as:  CARDIZEM CD  Take 1 capsule (240 mg total) by mouth daily. PM     esomeprazole 40 MG capsule  Commonly known as:  NEXIUM  Take 1 capsule (40 mg total) by mouth daily before breakfast.     fish oil-omega-3 fatty acids 1000 MG capsule  Take 2 g by mouth daily.     furosemide 40 MG tablet  Commonly known as:  LASIX  Take 40 mg by mouth daily as needed for fluid.     losartan 50 MG tablet  Commonly known as:  COZAAR  Take 100 mg by mouth daily. AM     memantine 10 MG tablet  Commonly known as:  NAMENDA  Take 10 mg by mouth 2 (two) times daily.     potassium chloride SA 20 MEQ tablet  Commonly known as:  K-DUR,KLOR-CON  Take 20 mEq by mouth as needed.     sotalol 80 MG tablet  Commonly known as:  BETAPACE  Take 80 mg  by mouth 2 (two) times daily.     warfarin 1 MG tablet  Commonly known as:  COUMADIN  Do not take coumadin tonight 10-16. Need INR in 24 hours to determine coumadin doses.       Allergies  Allergen Reactions  . Oxycodone Hcl Other (See Comments)    HALLUCINATIONS  . Prednisone Other (See Comments)    Hallucinations  . Aspirin Other (See Comments)    GI UPSET  . Codeine Other (See Comments)    GI UPSET  . Sulfa Antibiotics Other (See Comments)    GI UPSET      The results of significant diagnostics from this hospitalization (including imaging, microbiology, ancillary and laboratory) are listed below for reference.    Significant Diagnostic Studies: Ct Abdomen Pelvis Wo Contrast  09/08/2013   CLINICAL DATA:  Nausea and vomiting  EXAM: CT ABDOMEN AND PELVIS WITHOUT CONTRAST  TECHNIQUE: Multidetector CT imaging of the abdomen and pelvis was performed following the standard protocol without intravenous contrast.  COMPARISON:  Most recent prior CT abdomen/ pelvis 08/04/2010  FINDINGS: Lower Chest: Patchy airspace disease and consolidation in  the inferior aspect of the right lower and middle lobes. Mild dependent atelectasis noted in the left lower lobe. There is an associated small right pleural effusion. Mild left atrial enlargement. Incompletely imaged cardiac rhythm enhanced device with leads terminating in the right ventricular apex and right atrium. No pericardial effusion. Ingested oral contrast material is noted throughout the length of the visualized thoracic esophagus suggesting reflux.  Abdomen: Unenhanced CT was performed per clinician order. Lack of IV contrast limits sensitivity and specificity, especially for evaluation of abdominal/pelvic solid viscera. Stomach is mildly distended with ingested contrast. No acute abnormality. Unremarkable CT appearance of the spleen, adrenal glands and the pancreas. Stable subcentimeter hypoattenuating lesion in hepatic segment 2 dating back to  2011 is highly likely benign. No new acute lesion identified. High attenuation material layering within the gallbladder may reflect sludge and/ or small stones. No intra or extrahepatic biliary ductal dilatation.  Renal cortical atrophy bilaterally. No hydronephrosis or nephrolithiasis. Extrarenal pelvis on the right. Evaluation slightly limited by streak artifact related to posterior lumbar fixation hardware.  Multiple loops of dilated and fluid-filled small bowel with air-fluid levels noted in the left hemiabdomen. The the probable transition point in the left lower quadrant. No free air. Small amount of free fluid in the perisplenic space and anatomic pelvis. Extensive colonic diverticular disease without evidence of active inflammation.  Pelvis: Bladder is distended with urine. Surgical changes of prior hysterectomy. Trace free fluid. Probable bilateral oophorectomy.  Bones/Soft Tissues: No acute fracture or aggressive appearing lytic or blastic osseous lesion. L1-L5 posterior lumbar interbody fusion with bilateral pedicle screw and rod construct. Interbody grafts are noted at each level. The bones appear osteopenic. Detailed evaluation is limited by streak artifact. Degenerative changes noted at T12-L1.  Vascular: Limited evaluation in the absence of intravenous contrast. Atherosclerotic vascular calcifications without aneurysmal dilatation.  IMPRESSION: 1. High-grade small bowel obstruction with a small amount of associated free fluid but no free air. Probable transition point in the left lower quadrant. Suspect underlying adhesive disease. 2. Patchy infiltrate and consolidation in the dependent right middle and lower lobes concerning for aspiration given the clinical history of small bowel obstruction and emesis. 3. Reflux of oral contrast material throughout the visualized thoracic esophagus. 4. Gallbladder sludge and/ or small stones 5. Colonic diverticular disease without CT evidence of active  inflammation. 6. Additional ancillary findings as above. These results were called by telephone at the time of interpretation on 09/08/2013 at 6:33 PM to Dr. Dr. Renae Gloss, who verbally acknowledged these results.   Electronically Signed   By: Malachy Moan M.D.   On: 09/08/2013 18:34   Dg Abd 2 Views  09/13/2013   CLINICAL DATA:  Abdominal distention.  EXAM: ABDOMEN - 2 VIEW  COMPARISON:  09/11/2013  FINDINGS: Mild diffuse gaseous distention of bowel, predominantly small bowel. No real change since prior study, suggesting small bowel obstruction pattern. No free air organomegaly. Postoperative changes in the lumbar spine.  IMPRESSION: Stable small bowel distention.   Electronically Signed   By: Charlett Nose M.D.   On: 09/13/2013 07:29   Dg Abd 2 Views  09/11/2013   CLINICAL DATA:  Followup small bowel obstruction.  EXAM: ABDOMEN - 2 VIEW  COMPARISON:  09/10/2013.  FINDINGS: Distended loops of small bowel are again noted in the central abdomen. There is less small bowel distention than there was on the previous day study. Air-fluid levels are noted on the erect view. There is stool within a nondistended colon.  No free  air.  IMPRESSION: There is less small bowel dilation than on the previous day's study, suggesting mild improvement in the degree of small bowel obstruction. The partial small bowel obstruction, persists, however. No free air.   Electronically Signed   By: Amie Portland M.D.   On: 09/11/2013 13:34   Dg Abd 2 Views  09/09/2013   CLINICAL DATA:  Small bowel obstruction, NG tube  EXAM: ABDOMEN - 2 VIEW  COMPARISON:  CT abdomen pelvis dated 09/08/2013.  FINDINGS: Enteric tube terminates in the gastric body.  Multiple dilated loops of small bowel in the central abdomen, similar to prior CT, suggesting small bowel obstruction.  No evidence of free air on the lateral decubitus view.  Lumbar spine fixation hardware.  IMPRESSION: Enteric tube terminates in the gastric body.  Multiple dilated loops  of small bowel in the central abdomen, similar to prior CT, suggesting small bowel obstruction.   Electronically Signed   By: Charline Bills M.D.   On: 09/09/2013 08:35   Dg Abd Portable 2v  09/10/2013   CLINICAL DATA:  Vomiting ; small bowel obstruction.  EXAM: PORTABLE ABDOMEN - 2 VIEW  COMPARISON:  Abdominal radiograph 09/09/2013  FINDINGS: Slight interval increase in gaseous distention of multiple loops of small bowel within the abdomen measuring up to 5.6 cm. Residual contrast material is demonstrated within the ascending colon. The NG tube is present with tip and side-port coursing towards the gastric antrum/ proximal duodenum. Decubitus view demonstrates no definite free intraperitoneal air. Lumbar spinal fusion hardware re- demonstrated.  IMPRESSION: 1. Slight interval increase in marked gaseous distention of multiple loops of small bowel compatible with small bowel obstruction. 2. No definite evidence for free intraperitoneal air. 3. NG tube has been advanced with the tip and side port projecting towards the right upper quadrant, likely within the gastric antrum/ proximal duodenum. These results will be called to the ordering clinician or representative by the Radiologist Assistant, and communication documented in the PACS Dashboard.   Electronically Signed   By: Annia Belt M.D.   On: 09/10/2013 09:12    Microbiology: Recent Results (from the past 240 hour(s))  CULTURE, BLOOD (ROUTINE X 2)     Status: None   Collection Time    09/08/13 11:15 PM      Result Value Range Status   Specimen Description BLOOD RIGHT ARM   Final   Special Requests BOTTLES DRAWN AEROBIC ONLY 6CC   Final   Culture  Setup Time     Final   Value: 09/09/2013 03:39     Performed at [facility]   Culture     Final   Value: NO GROWTH 5 DAYS     Performed at [facility]   Report Status 09/15/2013 FINAL   Final  CULTURE, BLOOD (ROUTINE X 2)     Status: None   Collection Time    09/09/13  1:05 AM       Result Value Range Status   Specimen Description BLOOD RIGHT ARM   Final   Special Requests BOTTLES DRAWN AEROBIC AND ANAEROBIC 10CC EACH   Final   Culture  Setup Time     Final   Value: 09/09/2013 10:28     Performed at [facility]   Culture     Final   Value: NO GROWTH 5 DAYS     Performed at [facility]   Report Status 09/15/2013 FINAL   Final     Labs: Basic Metabolic  Panel:  Recent Labs Lab 09/10/13 0720 09/12/13 0600 09/13/13 0615 09/14/13 0500 09/16/13 0526  NA 142 142 139 143 141  K 3.5 3.2* 3.7 3.8 3.0*  CL 106 105 106 111 109  CO2 25 23 22 23 22   GLUCOSE 78 84 96 100* 87  BUN 21 15 10 10 6   CREATININE 1.21* 1.01 0.91 1.14* 0.80  CALCIUM 8.5 8.4 8.5 8.2* 8.0*   Liver Function Tests: No results found for this basename: AST, ALT, ALKPHOS, BILITOT, PROT, ALBUMIN,  in the last 168 hours No results found for this basename: LIPASE, AMYLASE,  in the last 168 hours No results found for this basename: AMMONIA,  in the last 168 hours CBC:  Recent Labs Lab 09/12/13 0600 09/13/13 0615 09/14/13 0500  WBC 6.0 6.3 7.1  HGB 8.9* 8.8* 8.4*  HCT 26.2* 25.8* 24.8*  MCV 84.2 84.6 84.4  PLT 192 196 175   Cardiac Enzymes: No results found for this basename: CKTOTAL, CKMB, CKMBINDEX, TROPONINI,  in the last 168 hours BNP: BNP (last 3 results) No results found for this basename: PROBNP,  in the last 8760 hours CBG: No results found for this basename: GLUCAP,  in the last 168 hours     Signed:  Ceilidh Torregrossa  [facility] 09/16/2013, 11:12 AM

## 2013-09-17 ENCOUNTER — Telehealth: Payer: Self-pay | Admitting: Family Medicine

## 2013-09-17 DIAGNOSIS — J690 Pneumonitis due to inhalation of food and vomit: Secondary | ICD-10-CM

## 2013-09-17 DIAGNOSIS — K5730 Diverticulosis of large intestine without perforation or abscess without bleeding: Secondary | ICD-10-CM

## 2013-09-17 DIAGNOSIS — I4891 Unspecified atrial fibrillation: Secondary | ICD-10-CM

## 2013-09-17 DIAGNOSIS — F0390 Unspecified dementia without behavioral disturbance: Secondary | ICD-10-CM

## 2013-09-17 NOTE — Telephone Encounter (Signed)
Clydie Braun from [facility] called stating that pts INR is 2.7 wants to know what to do? She is taking 1mg  tablet at home, but did not give to pt last night.

## 2013-09-17 NOTE — Telephone Encounter (Signed)
Home Health nurse given verbal order for patient to maintain 1 mg MWFSa  And 2 mg TTSun.  Recheck INR one week

## 2013-09-17 NOTE — Telephone Encounter (Signed)
Heather [name] says was D/C from [facility] and told to hold meds until INR done then have PCP regulate dose.  Has 1 mg tabs and current bottle says three tabs daily.   Note from hosp seem to indicate was on 1 mg MWFS  And 2 mg TTSun.   What dose do yoou want now??

## 2013-09-17 NOTE — Telephone Encounter (Signed)
This is therapeutic.  Recheck INR in 1 week.  Continue present dose.

## 2013-09-24 ENCOUNTER — Telehealth: Payer: Self-pay | Admitting: Family Medicine

## 2013-09-24 NOTE — Telephone Encounter (Signed)
This coumadin dose is fine, recheck in 2 weeks.

## 2013-09-24 NOTE — Telephone Encounter (Signed)
Nurse call about PT 27.9  INR 2.3  Current Cumidine 1 mg om M,W,F,S and 2 mg on T,TH,SUN. Please call Nurse back and let her know if the dosage needs to be changed or keep the same .  Nurse Hinda Kehr   [phone]

## 2013-09-24 NOTE — Telephone Encounter (Signed)
Do we need to make changes?

## 2013-09-24 NOTE — Telephone Encounter (Signed)
Heather [name] is aware of message

## 2013-09-30 ENCOUNTER — Ambulatory Visit (INDEPENDENT_AMBULATORY_CARE_PROVIDER_SITE_OTHER): Payer: Medicare Other | Admitting: Family Medicine

## 2013-09-30 ENCOUNTER — Encounter: Payer: Self-pay | Admitting: Family Medicine

## 2013-09-30 VITALS — BP 110/68 | HR 60 | Temp 97.0°F | Resp 16 | Ht 61.0 in | Wt 125.0 lb

## 2013-09-30 DIAGNOSIS — L8991 Pressure ulcer of unspecified site, stage 1: Secondary | ICD-10-CM

## 2013-09-30 DIAGNOSIS — R630 Anorexia: Secondary | ICD-10-CM

## 2013-09-30 DIAGNOSIS — I4891 Unspecified atrial fibrillation: Secondary | ICD-10-CM

## 2013-09-30 DIAGNOSIS — L8990 Pressure ulcer of unspecified site, unspecified stage: Secondary | ICD-10-CM

## 2013-09-30 LAB — PT WITH INR/FINGERSTICK: INR, fingerstick: 2.3 — ABNORMAL HIGH (ref 0.80–1.20)

## 2013-09-30 MED ORDER — MEGESTROL ACETATE 400 MG/10ML PO SUSP: 400.0000 mg | Freq: Every day | ORAL | Status: DC

## 2013-09-30 NOTE — Progress Notes (Signed)
Subjective:    Patient ID: Heather [name], female    DOB: Jul 18, 1933, 79 y.o.   MRN: [id number]  HPI Patient was recently admitted with a small bowel obstruction. History of hospital conservatively with an NG tube and bowel rest. She is here today for followup. Her husband is concerned because she is not eating. She lost 5 pounds since her hospitalization. She has no appetite. They have tried Ensure without success. Just has a sore developing on the bottom of her right foot. Is located on the plantar aspect of the head of the third MTP joint. It is approximately 7 mm in size and consists of a subcutaneous bruise without ulceration.  There is no evidence of cellulitis, osteomyelitis, or infection. She is here today to recheck her Coumadin. She is to take 1 mg on Monday Wednesday Fridays and Saturdays. She takes 10 mg on Tuesday Thursday and Sunday. Her INR today in clinic is 2.3 and therapeutic. Past Medical History  Diagnosis Date  . Hyperlipidemia   . Chronic LBP   . Gout 01/2012    right index and middle fingers  . Dysrhythmia     atrial fib  . Dementia   . Scoliosis   . Hypertension     under control  . Full dentures   . Wears glasses   . Pacemaker     dr croitoru-[facility]  . Arthritis   . GERD (gastroesophageal reflux disease)   . Atrial fibrillation   . Hiatal hernia   . Seizures 2009    seizure x 1; none since pacemaker insertion   Past Surgical History  Procedure Laterality Date  . Tonsillectomy    . Hernia repair    . Trigger finger release  04/24/2011    release A-1 pulley left index  . Carpal tunnel release  11/14/2009    right  . Appendectomy    . Abdominal hysterectomy    . Heel spur surgery      both feet  . Eye surgery      both cataracts  . Insert / replace / remove pacemaker  2009  . Shoulder arthroscopy  04/15/2006 - left    right:  10/18/2005, 09/24/2005  . Lumbar laminectomy/decompression microdiscectomy  02/06/2005; 10/21/2003    with fusions   . Back surgery   4098,1191  . Cardioversion  08/08/2010    for atrial fib.  . Trigger finger release  08/10/2003    right thumb  . Wrist arthroscopy  07/04/2010    left wrist; also left CTR  . Spinal growth rods    . Knee arthroscopy      right  . Trigger finger release  01/21/2012    Procedure: RELEASE TRIGGER FINGER/A-1 PULLEY;  Surgeon: Nicki Reaper, MD;  Location: [facility];  Service: Orthopedics;  Laterality: Right;  right ring finger  . I&d extremity  02/05/2012    Procedure: IRRIGATION AND DEBRIDEMENT EXTREMITY;  Surgeon: Nicki Reaper, MD;  Location: [facility];  Service: Orthopedics;  Laterality: Right;  debridement right index and middle fingers  . Trigger finger release Left 03/17/2013    Procedure: RELEASE TRIGGER FINGER/A-1 PULLEY LEFT MIDDLE, RING AND SMALL FINGERS;  Surgeon: Nicki Reaper, MD;  Location: [facility];  Service: Orthopedics;  Laterality: Left;   Current Outpatient Prescriptions on File Prior to Visit  Medication Sig Dispense Refill  . colchicine 0.6 MG tablet Take 0.6 mg by mouth daily.      . cyclobenzaprine (  FLEXERIL) 10 MG tablet Take 10 mg by mouth 3 (three) times daily as needed for muscle spasms.      Marland Kitchen diltiazem (CARDIZEM CD) 240 MG 24 hr capsule Take 1 capsule (240 mg total) by mouth daily. PM  30 capsule  4  . esomeprazole (NEXIUM) 40 MG capsule Take 1 capsule (40 mg total) by mouth daily before breakfast.  30 capsule  11  . fish oil-omega-3 fatty acids 1000 MG capsule Take 2 g by mouth daily.      . furosemide (LASIX) 40 MG tablet Take 40 mg by mouth daily as needed for fluid.      Marland Kitchen losartan (COZAAR) 50 MG tablet Take 100 mg by mouth daily. AM      . memantine (NAMENDA) 10 MG tablet Take 10 mg by mouth 2 (two) times daily.      . polyethylene glycol (MIRALAX / GLYCOLAX) packet Take 17 g by mouth daily.  14 each  0  . potassium chloride SA (K-DUR,KLOR-CON) 20 MEQ tablet Take 20 mEq by mouth as needed.      . sotalol (BETAPACE) 80 MG  tablet Take 80 mg by mouth 2 (two) times daily.       No current facility-administered medications on file prior to visit.   Allergies  Allergen Reactions  . Oxycodone Hcl Other (See Comments)    HALLUCINATIONS  . Prednisone Other (See Comments)    Hallucinations  . Aspirin Other (See Comments)    GI UPSET  . Codeine Other (See Comments)    GI UPSET  . Sulfa Antibiotics Other (See Comments)    GI UPSET   History   Social History  . Marital Status: Married    Spouse Name: N/A    Number of Children: N/A  . Years of Education: N/A   Occupational History  . Not on file.   Social History Main Topics  . Smoking status: Never Smoker   . Smokeless tobacco: Never Used  . Alcohol Use: No  . Drug Use: No  . Sexual Activity: Not on file   Other Topics Concern  . Not on file   Social History Narrative  . No narrative on file      Review of Systems  All other systems reviewed and are negative.       Objective:   Physical Exam  Vitals reviewed. Cardiovascular: Normal rate and normal heart sounds.   Pulmonary/Chest: Effort normal and breath sounds normal.  Abdominal: Soft. Bowel sounds are normal. She exhibits no distension. There is no tenderness. There is no rebound.   7 mm bruise on the plantar aspect of the right foot at the base of the third metatarsal. The bruise directly overlies palpable bone with very little fat pad        Assessment & Plan:  1. A-fib INR is therapeutic at 2.3. Recheck INR in 4 weeks. Continue Coumadin at his present dose. - PT with INR/Fingerstick  2. Anorexia Start Megace 400 mg by mouth  Daily. Recheck weight in 2 weeks.  I also recommended Ensure 3 times a day with meals. - megestrol (MEGACE) 400 MG/10ML suspension; Take 10 mLs (400 mg total) by mouth daily.  Dispense: 240 mL; Refill: 0  3. Pressure sore, stage I I believe this is a pressure sore developing from the underlying metatarsal. I explained to the patient and her husband  that we need to offload the pressure for this to heal. Therefore I recommended that they get a corn  pad and cut the center out of the pad. They're to place the opening over the palpable bone and bruise. This is an effort to offload the pressure. Also recommended that they should get comfortable padded shoes and try to stay off the foot as much as possible. Recheck in 2 weeks to assess for healing, sooner if worse. I will refer to podiatry if the pressure sore does not heal and she may require surgery to correct the bone abnormality creating the pressure point.

## 2013-10-19 ENCOUNTER — Ambulatory Visit: Payer: Medicare Other | Admitting: Family Medicine

## 2013-10-21 ENCOUNTER — Other Ambulatory Visit: Payer: Self-pay | Admitting: Family Medicine

## 2013-10-21 ENCOUNTER — Ambulatory Visit (INDEPENDENT_AMBULATORY_CARE_PROVIDER_SITE_OTHER): Payer: Medicare Other | Admitting: Family Medicine

## 2013-10-21 ENCOUNTER — Encounter: Payer: Self-pay | Admitting: Family Medicine

## 2013-10-21 VITALS — BP 110/68 | HR 62 | Temp 97.6°F | Resp 14 | Ht 61.0 in | Wt 130.0 lb

## 2013-10-21 DIAGNOSIS — L8991 Pressure ulcer of unspecified site, stage 1: Secondary | ICD-10-CM

## 2013-10-21 DIAGNOSIS — R630 Anorexia: Secondary | ICD-10-CM

## 2013-10-21 DIAGNOSIS — L8990 Pressure ulcer of unspecified site, unspecified stage: Secondary | ICD-10-CM

## 2013-10-21 DIAGNOSIS — D649 Anemia, unspecified: Secondary | ICD-10-CM

## 2013-10-21 LAB — COMPLETE METABOLIC PANEL WITH GFR
ALT: 8 U/L (ref 0–35)
AST: 13 U/L (ref 0–37)
Alkaline Phosphatase: 53 U/L (ref 39–117)
BUN: 27 mg/dL — ABNORMAL HIGH (ref 6–23)
CO2: 22 mEq/L (ref 19–32)
Calcium: 8.9 mg/dL (ref 8.4–10.5)
Chloride: 107 mEq/L (ref 96–112)
Creat: 1.6 mg/dL — ABNORMAL HIGH (ref 0.50–1.10)
GFR, Est African American: 35 mL/min — ABNORMAL LOW
GFR, Est Non African American: 31 mL/min — ABNORMAL LOW
Glucose, Bld: 86 mg/dL (ref 70–99)
Total Bilirubin: 0.5 mg/dL (ref 0.3–1.2)
Total Protein: 5.9 g/dL — ABNORMAL LOW (ref 6.0–8.3)

## 2013-10-21 LAB — CBC WITH DIFFERENTIAL/PLATELET
Basophils Absolute: 0 10*3/uL (ref 0.0–0.1)
Eosinophils Absolute: 0 10*3/uL (ref 0.0–0.7)
Eosinophils Relative: 1 % (ref 0–5)
HCT: 33.3 % — ABNORMAL LOW (ref 36.0–46.0)
Hemoglobin: 11 g/dL — ABNORMAL LOW (ref 12.0–15.0)
Lymphocytes Relative: 26 % (ref 12–46)
Lymphs Abs: 1.7 10*3/uL (ref 0.7–4.0)
MCH: 29.4 pg (ref 26.0–34.0)
MCV: 89 fL (ref 78.0–100.0)
Monocytes Absolute: 0.6 10*3/uL (ref 0.1–1.0)
Monocytes Relative: 8 % (ref 3–12)
RBC: 3.74 MIL/uL — ABNORMAL LOW (ref 3.87–5.11)
WBC: 6.6 10*3/uL (ref 4.0–10.5)

## 2013-10-21 MED ORDER — MEGESTROL ACETATE 400 MG/10ML PO SUSP: 400.0000 mg | Freq: Every day | ORAL | Status: DC

## 2013-10-21 NOTE — Telephone Encounter (Signed)
Ok to refill 

## 2013-10-21 NOTE — Telephone Encounter (Signed)
ok 

## 2013-10-21 NOTE — Progress Notes (Signed)
Subjective:    Patient ID: Heather [name], female    DOB: 1933/05/29, 79 y.o.   MRN: [id number]  HPI 09/30/13 Patient was recently admitted with a small bowel obstruction. History of hospital conservatively with an NG tube and bowel rest. She is here today for followup. Her husband is concerned because she is not eating. She lost 5 pounds since her hospitalization. She has no appetite. They have tried Ensure without success. Just has a sore developing on the bottom of her right foot. Is located on the plantar aspect of the head of the third MTP joint. It is approximately 7 mm in size and consists of a subcutaneous bruise without ulceration.  There is no evidence of cellulitis, osteomyelitis, or infection. She is here today to recheck her Coumadin. She is to take 1 mg on Monday Wednesday Fridays and Saturdays. She takes 10 mg on Tuesday Thursday and Sunday. Her INR today in clinic is 2.3 and therapeutic.At that time, my plan was: 1. A-fib INR is therapeutic at 2.3. Recheck INR in 4 weeks. Continue Coumadin at his present dose. - PT with INR/Fingerstick  2. Anorexia Start Megace 400 mg by mouth  Daily. Recheck weight in 2 weeks.  I also recommended Ensure 3 times a day with meals. - megestrol (MEGACE) 400 MG/10ML suspension; Take 10 mLs (400 mg total) by mouth daily.  Dispense: 240 mL; Refill: 0  3. Pressure sore, stage I I believe this is a pressure sore developing from the underlying metatarsal. I explained to the patient and her husband that we need to offload the pressure for this to heal. Therefore I recommended that they get a corn pad and cut the center out of the pad. They're to place the opening over the palpable bone and bruise. This is an effort to offload the pressure. Also recommended that they should get comfortable padded shoes and try to stay off the foot as much as possible. Recheck in 2 weeks to assess for healing, sooner if worse. I will refer to podiatry if the pressure sore does not  heal and she may require surgery to correct the bone abnormality creating the pressure point.  She is here today for followup. They have been applying the corn pads daily to the area on the bottom of her right foot she is no longer having any pain in that area. However the bruise itself has not improved. There is no ulcer or infection. There is no skin breakdown. There is a subcutaneous bruise on. Her appetite has improved. She's gained 5 pounds since her last office visit on the Megace. Dear hospitalization she became anemic with a hemoglobin slightly above 8. We're due to recheck that today. Past Medical History  Diagnosis Date  . Hyperlipidemia   . Chronic LBP   . Gout 01/2012    right index and middle fingers  . Dysrhythmia     atrial fib  . Dementia   . Scoliosis   . Hypertension     under control  . Full dentures   . Wears glasses   . Pacemaker     dr croitoru-[facility]  . Arthritis   . GERD (gastroesophageal reflux disease)   . Atrial fibrillation   . Hiatal hernia   . Seizures 2009    seizure x 1; none since pacemaker insertion   Past Surgical History  Procedure Laterality Date  . Tonsillectomy    . Hernia repair    . Trigger finger release  04/24/2011  release A-1 pulley left index  . Carpal tunnel release  11/14/2009    right  . Appendectomy    . Abdominal hysterectomy    . Heel spur surgery      both feet  . Eye surgery      both cataracts  . Insert / replace / remove pacemaker  2009  . Shoulder arthroscopy  04/15/2006 - left    right:  10/18/2005, 09/24/2005  . Lumbar laminectomy/decompression microdiscectomy  02/06/2005; 10/21/2003    with fusions   . Back surgery  4540,9811  . Cardioversion  08/08/2010    for atrial fib.  . Trigger finger release  08/10/2003    right thumb  . Wrist arthroscopy  07/04/2010    left wrist; also left CTR  . Spinal growth rods    . Knee arthroscopy      right  . Trigger finger release  01/21/2012    Procedure: RELEASE TRIGGER  FINGER/A-1 PULLEY;  Surgeon: Nicki Reaper, MD;  Location: [facility];  Service: Orthopedics;  Laterality: Right;  right ring finger  . I&d extremity  02/05/2012    Procedure: IRRIGATION AND DEBRIDEMENT EXTREMITY;  Surgeon: Nicki Reaper, MD;  Location: [facility];  Service: Orthopedics;  Laterality: Right;  debridement right index and middle fingers  . Trigger finger release Left 03/17/2013    Procedure: RELEASE TRIGGER FINGER/A-1 PULLEY LEFT MIDDLE, RING AND SMALL FINGERS;  Surgeon: Nicki Reaper, MD;  Location: [facility];  Service: Orthopedics;  Laterality: Left;   Current Outpatient Prescriptions on File Prior to Visit  Medication Sig Dispense Refill  . colchicine 0.6 MG tablet Take 0.6 mg by mouth daily.      . cyclobenzaprine (FLEXERIL) 10 MG tablet Take 10 mg by mouth 3 (three) times daily as needed for muscle spasms.      Marland Kitchen diltiazem (CARDIZEM CD) 240 MG 24 hr capsule Take 1 capsule (240 mg total) by mouth daily. PM  30 capsule  4  . esomeprazole (NEXIUM) 40 MG capsule Take 1 capsule (40 mg total) by mouth daily before breakfast.  30 capsule  11  . fish oil-omega-3 fatty acids 1000 MG capsule Take 2 g by mouth daily.      . furosemide (LASIX) 40 MG tablet Take 40 mg by mouth daily as needed for fluid.      Marland Kitchen losartan (COZAAR) 50 MG tablet Take 100 mg by mouth daily. AM      . memantine (NAMENDA) 10 MG tablet Take 10 mg by mouth 2 (two) times daily.      . polyethylene glycol (MIRALAX / GLYCOLAX) packet Take 17 g by mouth daily.  14 each  0  . potassium chloride SA (K-DUR,KLOR-CON) 20 MEQ tablet Take 20 mEq by mouth as needed.      . sotalol (BETAPACE) 80 MG tablet Take 80 mg by mouth 2 (two) times daily.      Marland Kitchen warfarin (COUMADIN) 1 MG tablet 1 mg om M,W,F,S and 2 mg on T,TH,SUN       No current facility-administered medications on file prior to visit.   Allergies  Allergen Reactions  . Oxycodone Hcl Other (See Comments)    HALLUCINATIONS  .  Prednisone Other (See Comments)    Hallucinations  . Aspirin Other (See Comments)    GI UPSET  . Codeine Other (See Comments)    GI UPSET  . Sulfa Antibiotics Other (See Comments)    GI UPSET  History   Social History  . Marital Status: Married    Spouse Name: N/A    Number of Children: N/A  . Years of Education: N/A   Occupational History  . Not on file.   Social History Main Topics  . Smoking status: Never Smoker   . Smokeless tobacco: Never Used  . Alcohol Use: No  . Drug Use: No  . Sexual Activity: Not on file   Other Topics Concern  . Not on file   Social History Narrative  . No narrative on file      Review of Systems  All other systems reviewed and are negative.       Objective:   Physical Exam  Vitals reviewed. Cardiovascular: Normal rate and normal heart sounds.   Pulmonary/Chest: Effort normal and breath sounds normal.  Abdominal: Soft. Bowel sounds are normal. She exhibits no distension. There is no tenderness. There is no rebound.   7 mm bruise on the plantar aspect of the right foot at the base of the third metatarsal. The bruise directly overlies palpable bone with very little fat pad        Assessment & Plan:  1. Anemia Recheck CBC. If still anemic check iron, B12, folic acid, and stool guaiac cards - CBC with Differential - COMPLETE METABOLIC PANEL WITH GFR  2. Anorexia Seems to be improving. Continue Megace for one more month. At that time hopefully her appetite will have returned to normal.   - megestrol (MEGACE) 400 MG/10ML suspension; Take 10 mLs (400 mg total) by mouth daily.  Dispense: 240 mL; Refill: 2  3. Pressure sore, stage I Delusional her foot is stable. There is no evidence of infection or skin breakdown. She is not in pain. Therefore I do not feel a sense of urgency and referring her to a podiatrist for possible surgery. We will continue the corn pads with daily monitoring. If the lesion becomes painful or skin breakdown  occurs we will consult podiatry.  I also updated the patient's pneumonia vaccine with Prevnar 13.

## 2013-10-29 ENCOUNTER — Other Ambulatory Visit: Payer: Self-pay | Admitting: Cardiovascular Disease

## 2013-11-01 ENCOUNTER — Encounter: Payer: Self-pay | Admitting: Family Medicine

## 2013-11-01 ENCOUNTER — Ambulatory Visit (INDEPENDENT_AMBULATORY_CARE_PROVIDER_SITE_OTHER): Payer: Medicare Other | Admitting: Family Medicine

## 2013-11-01 VITALS — BP 136/70 | HR 72 | Temp 98.8°F | Resp 18 | Ht 61.0 in | Wt 127.0 lb

## 2013-11-01 DIAGNOSIS — I4891 Unspecified atrial fibrillation: Secondary | ICD-10-CM

## 2013-11-01 LAB — PT WITH INR/FINGERSTICK: INR, fingerstick: 2.2 — ABNORMAL HIGH (ref 0.80–1.20)

## 2013-11-01 NOTE — Telephone Encounter (Signed)
Rx was sent to pharmacy electronically. 

## 2013-11-01 NOTE — Progress Notes (Signed)
Subjective:    Patient ID: Heather [name], female    DOB: 1933-05-15, 79 y.o.   MRN: [id number]  HPI 09/30/13 Patient was recently admitted with a small bowel obstruction. History of hospital conservatively with an NG tube and bowel rest. She is here today for followup. Her husband is concerned because she is not eating. She lost 5 pounds since her hospitalization. She has no appetite. They have tried Ensure without success. Just has a sore developing on the bottom of her right foot. Is located on the plantar aspect of the head of the third MTP joint. It is approximately 7 mm in size and consists of a subcutaneous bruise without ulceration.  There is no evidence of cellulitis, osteomyelitis, or infection. She is here today to recheck her Coumadin. She is to take 1 mg on Monday Wednesday Fridays and Saturdays. She takes 2 mg on Tuesday Thursday and Sunday. Her INR today in clinic is 2.3 and therapeutic.  At that time, my plan was: 1. A-fib INR is therapeutic at 2.3. Recheck INR in 4 weeks. Continue Coumadin at his present dose. - PT with INR/Fingerstick  She is here today for a Coumadin check. Her INR is 2.2. She is currently on 2 mg on Tuesday, Thursday, Sunday and 1 mg on every other day. Overall she is doing well. DTRs callus on the base of her right foot is unchanged. There is no evidence of cellulitis or skin breakdown. Past Medical History  Diagnosis Date  . Hyperlipidemia   . Chronic LBP   . Gout 01/2012    right index and middle fingers  . Dysrhythmia     atrial fib  . Dementia   . Scoliosis   . Hypertension     under control  . Full dentures   . Wears glasses   . Pacemaker     dr croitoru-[facility]  . Arthritis   . GERD (gastroesophageal reflux disease)   . Atrial fibrillation   . Hiatal hernia   . Seizures 2009    seizure x 1; none since pacemaker insertion   Past Surgical History  Procedure Laterality Date  . Tonsillectomy    . Hernia repair    . Trigger finger release   04/24/2011    release A-1 pulley left index  . Carpal tunnel release  11/14/2009    right  . Appendectomy    . Abdominal hysterectomy    . Heel spur surgery      both feet  . Eye surgery      both cataracts  . Insert / replace / remove pacemaker  2009  . Shoulder arthroscopy  04/15/2006 - left    right:  10/18/2005, 09/24/2005  . Lumbar laminectomy/decompression microdiscectomy  02/06/2005; 10/21/2003    with fusions   . Back surgery  4098,1191  . Cardioversion  08/08/2010    for atrial fib.  . Trigger finger release  08/10/2003    right thumb  . Wrist arthroscopy  07/04/2010    left wrist; also left CTR  . Spinal growth rods    . Knee arthroscopy      right  . Trigger finger release  01/21/2012    Procedure: RELEASE TRIGGER FINGER/A-1 PULLEY;  Surgeon: Nicki Reaper, MD;  Location: [facility];  Service: Orthopedics;  Laterality: Right;  right ring finger  . I&d extremity  02/05/2012    Procedure: IRRIGATION AND DEBRIDEMENT EXTREMITY;  Surgeon: Nicki Reaper, MD;  Location: [facility];  Service: Orthopedics;  Laterality: Right;  debridement right index and middle fingers  . Trigger finger release Left 03/17/2013    Procedure: RELEASE TRIGGER FINGER/A-1 PULLEY LEFT MIDDLE, RING AND SMALL FINGERS;  Surgeon: Nicki Reaper, MD;  Location: [facility];  Service: Orthopedics;  Laterality: Left;   Current Outpatient Prescriptions on File Prior to Visit  Medication Sig Dispense Refill  . colchicine 0.6 MG tablet Take 0.6 mg by mouth daily.      . cyclobenzaprine (FLEXERIL) 10 MG tablet Take 10 mg by mouth 3 (three) times daily as needed for muscle spasms.      . cyclobenzaprine (FLEXERIL) 10 MG tablet TAKE 1 TABLET BY MOUTH EVERY 8 HOURS ** USE ONLY AS NEEDED FOR BACK PAIN**  90 tablet  1  . diltiazem (CARDIZEM CD) 240 MG 24 hr capsule Take 1 capsule (240 mg total) by mouth daily. PM  30 capsule  4  . esomeprazole (NEXIUM) 40 MG capsule Take 1 capsule (40 mg  total) by mouth daily before breakfast.  30 capsule  11  . fish oil-omega-3 fatty acids 1000 MG capsule Take 2 g by mouth daily.      . furosemide (LASIX) 40 MG tablet Take 40 mg by mouth daily as needed for fluid.      Marland Kitchen losartan (COZAAR) 50 MG tablet Take 100 mg by mouth daily. AM      . megestrol (MEGACE) 400 MG/10ML suspension Take 10 mLs (400 mg total) by mouth daily.  240 mL  2  . memantine (NAMENDA) 10 MG tablet Take 10 mg by mouth 2 (two) times daily.      . polyethylene glycol (MIRALAX / GLYCOLAX) packet Take 17 g by mouth daily.  14 each  0  . potassium chloride SA (K-DUR,KLOR-CON) 20 MEQ tablet Take 20 mEq by mouth as needed.      . sotalol (BETAPACE) 80 MG tablet Take 80 mg by mouth 2 (two) times daily.      Marland Kitchen warfarin (COUMADIN) 1 MG tablet 1 mg om M,W,F,S and 2 mg on T,TH,SUN       No current facility-administered medications on file prior to visit.   Allergies  Allergen Reactions  . Oxycodone Hcl Other (See Comments)    HALLUCINATIONS  . Prednisone Other (See Comments)    Hallucinations  . Aspirin Other (See Comments)    GI UPSET  . Codeine Other (See Comments)    GI UPSET  . Sulfa Antibiotics Other (See Comments)    GI UPSET   History   Social History  . Marital Status: Married    Spouse Name: N/A    Number of Children: N/A  . Years of Education: N/A   Occupational History  . Not on file.   Social History Main Topics  . Smoking status: Never Smoker   . Smokeless tobacco: Never Used  . Alcohol Use: No  . Drug Use: No  . Sexual Activity: Not on file   Other Topics Concern  . Not on file   Social History Narrative  . No narrative on file      Review of Systems  All other systems reviewed and are negative.       Objective:   Physical Exam  Vitals reviewed. Cardiovascular: Normal rate and normal heart sounds.  An irregularly irregular rhythm present.  Pulmonary/Chest: Effort normal and breath sounds normal.  Abdominal: Soft. Bowel sounds are  normal. She exhibits no distension. There is no tenderness. There is  no rebound.          Assessment & Plan:   1. A-fib Continue Coumadin at its current dose. Recheck INR in 4-6 weeks. - PT with INR/Fingerstick

## 2013-11-18 ENCOUNTER — Other Ambulatory Visit: Payer: Self-pay | Admitting: Cardiovascular Disease

## 2013-11-18 NOTE — Telephone Encounter (Signed)
Rx was sent to pharmacy electronically. 

## 2013-12-03 ENCOUNTER — Ambulatory Visit (INDEPENDENT_AMBULATORY_CARE_PROVIDER_SITE_OTHER): Payer: Medicare Other | Admitting: Family Medicine

## 2013-12-03 ENCOUNTER — Encounter: Payer: Self-pay | Admitting: Family Medicine

## 2013-12-03 VITALS — BP 126/87 | HR 78 | Temp 98.3°F | Resp 18 | Wt 131.0 lb

## 2013-12-03 DIAGNOSIS — I4891 Unspecified atrial fibrillation: Secondary | ICD-10-CM

## 2013-12-03 LAB — PT WITH INR/FINGERSTICK
INR FINGERSTICK: 2.4 — AB (ref 0.80–1.20)
PT FINGERSTICK: 28.4 s — AB (ref 10.4–12.5)

## 2013-12-03 NOTE — Progress Notes (Signed)
Subjective:    Patient ID: Heather [name], female    DOB: 10-May-1933, 80 y.o.   MRN: [id number]  HPI 09/30/13 Patient was recently admitted with a small bowel obstruction. History of hospital conservatively with an NG tube and bowel rest. She is here today for followup. Her husband is concerned because she is not eating. She lost 5 pounds since her hospitalization. She has no appetite. They have tried Ensure without success. She has a sore developing on the bottom of her right foot. Is located on the plantar aspect of the head of the third MTP joint. It is approximately 7 mm in size and consists of a subcutaneous bruise without ulceration.  There is no evidence of cellulitis, osteomyelitis, or infection. She is here today to recheck her Coumadin. She is to take 1 mg on Monday Wednesday Fridays and Saturdays. She takes 2 mg on Tuesday Thursday and Sunday. Her INR today in clinic is 2.3 and therapeutic.  At that time, my plan was: 1. A-fib INR is therapeutic at 2.3. Recheck INR in 4 weeks. Continue Coumadin at his present dose. - PT with INR/Fingerstick  11/02/13 She is here today for a Coumadin check. Her INR is 2.2. She is currently on 2 mg on Tuesday, Thursday, Sunday and 1 mg on every other day. Overall she is doing well. The callus on the base of her right foot is unchanged. There is no evidence of cellulitis or skin breakdown.  At that time, my plan was: 1. A-fib Continue Coumadin at its current dose. Recheck INR in 4-6 weeks. - PT with INR/Fingerstick   12/03/13 She is here today for a recheck.  Her INR is therapeutic today at 2.4.  She denies any bleeding or bruising. She has gained 5-6 pounds since starting the Megace and she is eating well. She has no anorexia. She has no nausea or vomiting. The bruise/callus on the plantar aspect of her right foot has completely healed. She does have a collapse transverse arch as well as a right second hammertoe which is causing persistent metatarsalgia in the  area but the callus and bruising have healed using the padding. Past Medical History  Diagnosis Date  . Hyperlipidemia   . Chronic LBP   . Gout 01/2012    right index and middle fingers  . Dysrhythmia     atrial fib  . Dementia   . Scoliosis   . Hypertension     under control  . Full dentures   . Wears glasses   . Pacemaker     dr croitoru[facility]  . Arthritis   . GERD (gastroesophageal reflux disease)   . Atrial fibrillation   . Hiatal hernia   . Seizures 2009    seizure x 1; none since pacemaker insertion   Past Surgical History  Procedure Laterality Date  . Tonsillectomy    . Hernia repair    . Trigger finger release  04/24/2011    release A-1 pulley left index  . Carpal tunnel release  11/14/2009    right  . Appendectomy    . Abdominal hysterectomy    . Heel spur surgery      both feet  . Eye surgery      both cataracts  . Insert / replace / remove pacemaker  2009  . Shoulder arthroscopy  04/15/2006 - left    right:  10/18/2005, 09/24/2005  . Lumbar laminectomy/decompression microdiscectomy  02/06/2005; 10/21/2003    with fusions   . Back surgery  [id number],[id number]  . Cardioversion  08/08/2010    for atrial fib.  . Trigger finger release  08/10/2003    right thumb  . Wrist arthroscopy  07/04/2010    left wrist; also left CTR  . Spinal growth rods    . Knee arthroscopy      right  . Trigger finger release  01/21/2012    Procedure: RELEASE TRIGGER FINGER/A-1 PULLEY;  Surgeon: Nicki ReaperGary R Kuzma, MD;  Location: [facility];  Service: Orthopedics;  Laterality: Right;  right ring finger  . I&d extremity  02/05/2012    Procedure: IRRIGATION AND DEBRIDEMENT EXTREMITY;  Surgeon: Nicki ReaperGary R Kuzma, MD;  Location: [facility];  Service: Orthopedics;  Laterality: Right;  debridement right index and middle fingers  . Trigger finger release Left 03/17/2013    Procedure: RELEASE TRIGGER FINGER/A-1 PULLEY LEFT MIDDLE, RING AND SMALL FINGERS;  Surgeon: Nicki ReaperGary R Kuzma, MD;   Location:  SUR[facility]ERY [facility];  Service: Orthopedics;  Laterality: Left;   Current Outpatient Prescriptions on File Prior to Visit  Medication Sig Dispense Refill  . colchicine 0.6 MG tablet Take 0.6 mg by mouth daily.      . cyclobenzaprine (FLEXERIL) 10 MG tablet Take 10 mg by mouth 3 (three) times daily as needed for muscle spasms.      . cyclobenzaprine (FLEXERIL) 10 MG tablet TAKE 1 TABLET BY MOUTH EVERY 8 HOURS ** USE ONLY AS NEEDED FOR BACK PAIN**  90 tablet  1  . diltiazem (CARDIZEM CD) 240 MG 24 hr capsule TAKE 1 CAPSULE (240 MG TOTAL) BY MOUTH DAILY. PM  30 capsule  9  . esomeprazole (NEXIUM) 40 MG capsule Take 1 capsule (40 mg total) by mouth daily before breakfast.  30 capsule  11  . fish oil-omega-3 fatty acids 1000 MG capsule Take 2 g by mouth daily.      . furosemide (LASIX) 40 MG tablet Take 40 mg by mouth daily as needed for fluid.      Marland Kitchen. losartan (COZAAR) 100 MG tablet TAKE 1 TABLET DAILY  90 tablet  1  . losartan (COZAAR) 50 MG tablet Take 100 mg by mouth daily. AM      . megestrol (MEGACE) 400 MG/10ML suspension Take 10 mLs (400 mg total) by mouth daily.  240 mL  2  . memantine (NAMENDA) 10 MG tablet Take 10 mg by mouth 2 (two) times daily.      . polyethylene glycol (MIRALAX / GLYCOLAX) packet Take 17 g by mouth daily.  14 each  0  . potassium chloride SA (K-DUR,KLOR-CON) 20 MEQ tablet Take 20 mEq by mouth as needed.      . sotalol (BETAPACE) 80 MG tablet Take 80 mg by mouth 2 (two) times daily.      Marland Kitchen. warfarin (COUMADIN) 1 MG tablet 1 mg om M,W,F,S and 2 mg on T,TH,SUN       No current facility-administered medications on file prior to visit.   Allergies  Allergen Reactions  . Oxycodone Hcl Other (See Comments)    HALLUCINATIONS  . Prednisone Other (See Comments)    Hallucinations  . Aspirin Other (See Comments)    GI UPSET  . Codeine Other (See Comments)    GI UPSET  . Sulfa Antibiotics Other (See Comments)    GI UPSET   History   Social History  .  Marital Status: Married    Spouse Name: N/A    Number of Children: N/A  . Years of Education:  N/A   Occupational History  . Not on file.   Social History Main Topics  . Smoking status: Never Smoker   . Smokeless tobacco: Never Used  . Alcohol Use: No  . Drug Use: No  . Sexual Activity: Not on file   Other Topics Concern  . Not on file   Social History Narrative  . No narrative on file      Review of Systems  All other systems reviewed and are negative.       Objective:   Physical Exam  Vitals reviewed. Cardiovascular: Normal rate and normal heart sounds.  An irregularly irregular rhythm present.  Pulmonary/Chest: Effort normal and breath sounds normal.  Abdominal: Soft. Bowel sounds are normal. She exhibits no distension. There is no tenderness. There is no rebound.    Collapsed transverse arch in the right foot. Right second hammertoe. Metatarsalgia at the base of the right second and third metatarsal      Assessment & Plan:  1. A-fib Coumadin is therapeutic. Recheck INR in 6 weeks. I recommended the patient discontinue Megace as her weight is back to her baseline. The callus on the plantar aspect of her right foot has healed. I recommended over-the-counter orthotics to prevent this in the future. I instructed her husband to keep a close eye on the base of her feet. - PT with INR/Fingerstick

## 2014-01-14 ENCOUNTER — Encounter: Payer: Self-pay | Admitting: Family Medicine

## 2014-01-14 ENCOUNTER — Ambulatory Visit (INDEPENDENT_AMBULATORY_CARE_PROVIDER_SITE_OTHER): Payer: Medicare Other | Admitting: Family Medicine

## 2014-01-14 ENCOUNTER — Ambulatory Visit
Admission: RE | Admit: 2014-01-14 | Discharge: 2014-01-14 | Disposition: A | Discharge status: Home / Self Care | Payer: 59 | Source: Ambulatory Visit | Attending: Family Medicine | Admitting: Family Medicine

## 2014-01-14 VITALS — BP 110/70 | HR 68 | Temp 98.3°F | Resp 14 | Ht 61.0 in | Wt 132.0 lb

## 2014-01-14 DIAGNOSIS — R059 Cough, unspecified: Secondary | ICD-10-CM

## 2014-01-14 DIAGNOSIS — I4891 Unspecified atrial fibrillation: Secondary | ICD-10-CM

## 2014-01-14 DIAGNOSIS — R05 Cough: Secondary | ICD-10-CM

## 2014-01-14 LAB — PT WITH INR/FINGERSTICK
INR, fingerstick: 1.3 — ABNORMAL HIGH (ref 0.80–1.20)
PT FINGERSTICK: 15.8 s — AB (ref 10.4–12.5)

## 2014-01-14 NOTE — Progress Notes (Signed)
Subjective:    Patient ID: Heather [name], female    DOB: 12/15/1932, 80 y.o.   MRN: [id number]  HPI 09/30/13 Patient was recently admitted with a small bowel obstruction. History of hospital conservatively with an NG tube and bowel rest. She is here today for followup. Her husband is concerned because she is not eating. She lost 5 pounds since her hospitalization. She has no appetite. They have tried Ensure without success. She has a sore developing on the bottom of her right foot. Is located on the plantar aspect of the head of the third MTP joint. It is approximately 7 mm in size and consists of a subcutaneous bruise without ulceration.  There is no evidence of cellulitis, osteomyelitis, or infection. She is here today to recheck her Coumadin. She is to take 1 mg on Monday Wednesday Fridays and Saturdays. She takes 2 mg on Tuesday Thursday and Sunday. Her INR today in clinic is 2.3 and therapeutic.  At that time, my plan was: 1. A-fib INR is therapeutic at 2.3. Recheck INR in 4 weeks. Continue Coumadin at his present dose. - PT with INR/Fingerstick  11/02/13 She is here today for a Coumadin check. Her INR is 2.2. She is currently on 2 mg on Tuesday, Thursday, Sunday and 1 mg on every other day. Overall she is doing well. The callus on the base of her right foot is unchanged. There is no evidence of cellulitis or skin breakdown.  At that time, my plan was: 1. A-fib Continue Coumadin at its current dose. Recheck INR in 4-6 weeks. - PT with INR/Fingerstick   12/03/13 She is here today for a recheck.  Her INR is therapeutic today at 2.4.  She denies any bleeding or bruising. She has gained 5-6 pounds since starting the Megace and she is eating well. She has no anorexia. She has no nausea or vomiting. The bruise/callus on the plantar aspect of her right foot has completely healed. She does have a collapse transverse arch as well as a right second hammertoe which is causing persistent metatarsalgia in the  area but the callus and bruising have healed using the padding.  At that time, my plan was: 1. A-fib Coumadin is therapeutic. Recheck INR in 6 weeks. I recommended the patient discontinue Megace as her weight is back to her baseline. The callus on the plantar aspect of her right foot has healed. I recommended over-the-counter orthotics to prevent this in the future. I instructed her husband to keep a close eye on the base of her feet. - PT with INR/Fingerstick  01/14/14 Patient is here today for a recheck.  She is currently on coumadin 2 mg T/R/Sun, and 1mg  on M/W/F/Sat.  unfortunately she has missed several doses of her Coumadin. She seems increasingly confused and disoriented. She is also missed her other medications and has not been taking her Lasix. She has +1 edema in both legs and also hearing faint rails in her right lower lung. She has no fevers or other signs of systemic illness. She does have a slight cough that I can appreciate today on exam.  Her INR is therapeutic at 1.3  Past Medical History  Diagnosis Date  . Hyperlipidemia   . Chronic LBP   . Gout 01/2012    right index and middle fingers  . Dysrhythmia     atrial fib  . Dementia   . Scoliosis   . Hypertension     under control  . Full dentures   .  Wears glasses   . Pacemaker     dr croitoru[facility]  . Arthritis   . GERD (gastroesophageal reflux disease)   . Atrial fibrillation   . Hiatal hernia   . Seizures 2009    seizure x 1; none since pacemaker insertion   Past Surgical History  Procedure Laterality Date  . Tonsillectomy    . Hernia repair    . Trigger finger release  04/24/2011    release A-1 pulley left index  . Carpal tunnel release  11/14/2009    right  . Appendectomy    . Abdominal hysterectomy    . Heel spur surgery      both feet  . Eye surgery      both cataracts  . Insert / replace / remove pacemaker  2009  . Shoulder arthroscopy  04/15/2006 - left    right:  10/18/2005, 09/24/2005  . Lumbar  laminectomy/decompression microdiscectomy  02/06/2005; 10/21/2003    with fusions   . Back surgery  [id number]  . Cardioversion  08/08/2010    for atrial fib.  . Trigger finger release  08/10/2003    right thumb  . Wrist arthroscopy  07/04/2010    left wrist; also left CTR  . Spinal growth rods    . Knee arthroscopy      right  . Trigger finger release  01/21/2012    Procedure: RELEASE TRIGGER FINGER/A-1 PULLEY;  Surgeon: Nicki Reaper, MD;  Location: [facility];  Service: Orthopedics;  Laterality: Right;  right ring finger  . I&d extremity  02/05/2012    Procedure: IRRIGATION AND DEBRIDEMENT EXTREMITY;  Surgeon: Nicki Reaper, MD;  Location: [facility];  Service: Orthopedics;  Laterality: Right;  debridement right index and middle fingers  . Trigger finger release Left 03/17/2013    Procedure: RELEASE TRIGGER FINGER/A-1 PULLEY LEFT MIDDLE, RING AND SMALL FINGERS;  Surgeon: Nicki Reaper, MD;  Location: [facility];  Service: Orthopedics;  Laterality: Left;   Current Outpatient Prescriptions on File Prior to Visit  Medication Sig Dispense Refill  . colchicine 0.6 MG tablet Take 0.6 mg by mouth daily.      . cyclobenzaprine (FLEXERIL) 10 MG tablet Take 10 mg by mouth 3 (three) times daily as needed for muscle spasms.      . cyclobenzaprine (FLEXERIL) 10 MG tablet TAKE 1 TABLET BY MOUTH EVERY 8 HOURS ** USE ONLY AS NEEDED FOR BACK PAIN**  90 tablet  1  . diltiazem (CARDIZEM CD) 240 MG 24 hr capsule TAKE 1 CAPSULE (240 MG TOTAL) BY MOUTH DAILY. PM  30 capsule  9  . esomeprazole (NEXIUM) 40 MG capsule Take 1 capsule (40 mg total) by mouth daily before breakfast.  30 capsule  11  . fish oil-omega-3 fatty acids 1000 MG capsule Take 2 g by mouth daily.      . furosemide (LASIX) 40 MG tablet Take 40 mg by mouth daily as needed for fluid.      Marland Kitchen losartan (COZAAR) 100 MG tablet TAKE 1 TABLET DAILY  90 tablet  1  . losartan (COZAAR) 50 MG tablet Take 100 mg by mouth  daily. AM      . megestrol (MEGACE) 400 MG/10ML suspension Take 10 mLs (400 mg total) by mouth daily.  240 mL  2  . memantine (NAMENDA) 10 MG tablet Take 10 mg by mouth 2 (two) times daily.      . polyethylene glycol (MIRALAX / GLYCOLAX) packet Take 17  g by mouth daily.  14 each  0  . potassium chloride SA (K-DUR,KLOR-CON) 20 MEQ tablet Take 20 mEq by mouth as needed.      . sotalol (BETAPACE) 80 MG tablet Take 80 mg by mouth 2 (two) times daily.      Marland Kitchen. warfarin (COUMADIN) 1 MG tablet 1 mg om M,W,F,S and 2 mg on T,TH,SUN       No current facility-administered medications on file prior to visit.   Allergies  Allergen Reactions  . Oxycodone Hcl Other (See Comments)    HALLUCINATIONS  . Prednisone Other (See Comments)    Hallucinations  . Aspirin Other (See Comments)    GI UPSET  . Codeine Other (See Comments)    GI UPSET  . Sulfa Antibiotics Other (See Comments)    GI UPSET   History   Social History  . Marital Status: Married    Spouse Name: N/A    Number of Children: N/A  . Years of Education: N/A   Occupational History  . Not on file.   Social History Main Topics  . Smoking status: Never Smoker   . Smokeless tobacco: Never Used  . Alcohol Use: No  . Drug Use: No  . Sexual Activity: Not on file   Other Topics Concern  . Not on file   Social History Narrative  . No narrative on file      Review of Systems  All other systems reviewed and are negative.       Objective:   Physical Exam  Vitals reviewed. Cardiovascular: Normal rate and normal heart sounds.  An irregularly irregular rhythm present.  Pulmonary/Chest: Effort normal and breath sounds normal.  Abdominal: Soft. Bowel sounds are normal. She exhibits no distension. There is no tenderness. There is no rebound.   patient has +1 edema in both legs and faint rales in her right lower lung      Assessment & Plan:  1. A-fib I asked the patient's husband to ensure that she is taking her medication  correctly over the next week and recheck her INR in one week. I suspect the patient may have not been taking her medications appropriately causing this sudden change in her INR. - PT with INR/Fingerstick  2. Cough I believe the patient is retaining fluid. I asked the patient's husband to ensure that she is taking her Lasix properly and I would recheck her in one week. I will obtain a chest x-ray to make sure that she is not developing a right lower lobe pneumonia which could also possibly explain the increasing confusion. - DG Chest 2 View; Future

## 2014-01-21 ENCOUNTER — Encounter: Payer: Self-pay | Admitting: Family Medicine

## 2014-01-21 ENCOUNTER — Ambulatory Visit (INDEPENDENT_AMBULATORY_CARE_PROVIDER_SITE_OTHER): Payer: Medicare Other | Admitting: Family Medicine

## 2014-01-21 VITALS — BP 122/58 | HR 58 | Temp 98.2°F | Resp 18 | Ht 61.0 in | Wt 129.0 lb

## 2014-01-21 DIAGNOSIS — I4891 Unspecified atrial fibrillation: Secondary | ICD-10-CM

## 2014-01-21 LAB — PT WITH INR/FINGERSTICK
INR FINGERSTICK: 1.4 — AB (ref 0.80–1.20)
PT, fingerstick: 17.1 seconds — ABNORMAL HIGH (ref 10.4–12.5)

## 2014-01-21 NOTE — Progress Notes (Signed)
Subjective:    Patient ID: Heather [name], female    DOB: 11/01/1933, 80 y.o.   MRN: [id number]  HPI 09/30/13 Patient was recently admitted with a small bowel obstruction. History of hospital conservatively with an NG tube and bowel rest. She is here today for followup. Her husband is concerned because she is not eating. She lost 5 pounds since her hospitalization. She has no appetite. They have tried Ensure without success. She has a sore developing on the bottom of her right foot. Is located on the plantar aspect of the head of the third MTP joint. It is approximately 7 mm in size and consists of a subcutaneous bruise without ulceration.  There is no evidence of cellulitis, osteomyelitis, or infection. She is here today to recheck her Coumadin. She is to take 1 mg on Monday Wednesday Fridays and Saturdays. She takes 2 mg on Tuesday Thursday and Sunday. Her INR today in clinic is 2.3 and therapeutic.  At that time, my plan was: 1. A-fib INR is therapeutic at 2.3. Recheck INR in 4 weeks. Continue Coumadin at his present dose. - PT with INR/Fingerstick  11/02/13 She is here today for a Coumadin check. Her INR is 2.2. She is currently on 2 mg on Tuesday, Thursday, Sunday and 1 mg on every other day. Overall she is doing well. The callus on the base of her right foot is unchanged. There is no evidence of cellulitis or skin breakdown.  At that time, my plan was: 1. A-fib Continue Coumadin at its current dose. Recheck INR in 4-6 weeks. - PT with INR/Fingerstick   12/03/13 She is here today for a recheck.  Her INR is therapeutic today at 2.4.  She denies any bleeding or bruising. She has gained 5-6 pounds since starting the Megace and she is eating well. She has no anorexia. She has no nausea or vomiting. The bruise/callus on the plantar aspect of her right foot has completely healed. She does have a collapse transverse arch as well as a right second hammertoe which is causing persistent metatarsalgia in the  area but the callus and bruising have healed using the padding.  At that time, my plan was: 1. A-fib Coumadin is therapeutic. Recheck INR in 6 weeks. I recommended the patient discontinue Megace as her weight is back to her baseline. The callus on the plantar aspect of her right foot has healed. I recommended over-the-counter orthotics to prevent this in the future. I instructed her husband to keep a close eye on the base of her feet. - PT with INR/Fingerstick  01/14/14 Patient is here today for a recheck.  She is currently on coumadin 2 mg T/R/Sun, and 1mg  on M/W/F/Sat.  unfortunately she has missed several doses of her Coumadin. She seems increasingly confused and disoriented. She is also missed her other medications and has not been taking her Lasix. She has +1 edema in both legs and also hearing faint rails in her right lower lung. She has no fevers or other signs of systemic illness. She does have a slight cough that I can appreciate today on exam.  Her INR is subtherapeutic at 1.3.  At that time, my plan was: 1. A-fib I asked the patient's husband to ensure that she is taking her medication correctly over the next week and recheck her INR in one week. I suspect the patient may have not been taking her medications appropriately causing this sudden change in her INR. - PT with INR/Fingerstick  2. Cough  I believe the patient is retaining fluid. I asked the patient's husband to ensure that she is taking her Lasix properly and I would recheck her in one week. I will obtain a chest x-ray to make sure that she is not developing a right lower lobe pneumonia which could also possibly explain the increasing confusion. - DG Chest 2 View; Future  01/21/14 Patient's chest x-ray showed no pneumonia. Therefore I believe his hearing on her exam last week was pulmonary edema due to her missing her Lasix dose. She has resumed her Lasix and her weight is down 3 pounds. She now has pitting edema in both legs and her  lungs are clear today. INR is still subtherapeutic at 1.4.    Past Medical History  Diagnosis Date  . Hyperlipidemia   . Chronic LBP   . Gout 01/2012    right index and middle fingers  . Dysrhythmia     atrial fib  . Dementia   . Scoliosis   . Hypertension     under control  . Full dentures   . Wears glasses   . Pacemaker     dr croitoru-[facility]  . Arthritis   . GERD (gastroesophageal reflux disease)   . Atrial fibrillation   . Hiatal hernia   . Seizures 2009    seizure x 1; none since pacemaker insertion   Past Surgical History  Procedure Laterality Date  . Tonsillectomy    . Hernia repair    . Trigger finger release  04/24/2011    release A-1 pulley left index  . Carpal tunnel release  11/14/2009    right  . Appendectomy    . Abdominal hysterectomy    . Heel spur surgery      both feet  . Eye surgery      both cataracts  . Insert / replace / remove pacemaker  2009  . Shoulder arthroscopy  04/15/2006 - left    right:  10/18/2005, 09/24/2005  . Lumbar laminectomy/decompression microdiscectomy  02/06/2005; 10/21/2003    with fusions   . Back surgery  [id number]  . Cardioversion  08/08/2010    for atrial fib.  . Trigger finger release  08/10/2003    right thumb  . Wrist arthroscopy  07/04/2010    left wrist; also left CTR  . Spinal growth rods    . Knee arthroscopy      right  . Trigger finger release  01/21/2012    Procedure: RELEASE TRIGGER FINGER/A-1 PULLEY;  Surgeon: Nicki Reaper, MD;  Location: [facility];  Service: Orthopedics;  Laterality: Right;  right ring finger  . I&d extremity  02/05/2012    Procedure: IRRIGATION AND DEBRIDEMENT EXTREMITY;  Surgeon: Nicki Reaper, MD;  Location: [facility];  Service: Orthopedics;  Laterality: Right;  debridement right index and middle fingers  . Trigger finger release Left 03/17/2013    Procedure: RELEASE TRIGGER FINGER/A-1 PULLEY LEFT MIDDLE, RING AND SMALL FINGERS;  Surgeon: Nicki Reaper, MD;   Location:  [facility];  Service: Orthopedics;  Laterality: Left;   Current Outpatient Prescriptions on File Prior to Visit  Medication Sig Dispense Refill  . alum hydroxide-mag trisilicate (GAVISCON) 80-20 MG CHEW chewable tablet Chew 2 tablets by mouth as needed for indigestion or heartburn.      . colchicine 0.6 MG tablet Take 0.6 mg by mouth daily.      . cyclobenzaprine (FLEXERIL) 10 MG tablet TAKE 1 TABLET BY MOUTH EVERY 8 HOURS **  USE ONLY AS NEEDED FOR BACK PAIN**  90 tablet  1  . diltiazem (CARDIZEM CD) 240 MG 24 hr capsule TAKE 1 CAPSULE (240 MG TOTAL) BY MOUTH DAILY. PM  30 capsule  9  . esomeprazole (NEXIUM) 40 MG capsule Take 40 mg by mouth daily at 12 noon.      . fish oil-omega-3 fatty acids 1000 MG capsule Take 2 g by mouth daily.      . furosemide (LASIX) 40 MG tablet Take 40 mg by mouth daily as needed for fluid.      Marland Kitchen losartan (COZAAR) 100 MG tablet TAKE 1 TABLET DAILY  90 tablet  1  . memantine (NAMENDA) 10 MG tablet Take 10 mg by mouth 2 (two) times daily.      . potassium chloride SA (K-DUR,KLOR-CON) 20 MEQ tablet Take 20 mEq by mouth as needed.      . sotalol (BETAPACE) 80 MG tablet Take 80 mg by mouth 2 (two) times daily.      Marland Kitchen warfarin (COUMADIN) 1 MG tablet 1 mg om M,W,F,S and 2 mg on T,TH,SUN       No current facility-administered medications on file prior to visit.   Allergies  Allergen Reactions  . Oxycodone Hcl Other (See Comments)    HALLUCINATIONS  . Prednisone Other (See Comments)    Hallucinations  . Aspirin Other (See Comments)    GI UPSET  . Codeine Other (See Comments)    GI UPSET  . Sulfa Antibiotics Other (See Comments)    GI UPSET   History   Social History  . Marital Status: Married    Spouse Name: N/A    Number of Children: N/A  . Years of Education: N/A   Occupational History  . Not on file.   Social History Main Topics  . Smoking status: Never Smoker   . Smokeless tobacco: Never Used  . Alcohol Use: No  . Drug  Use: No  . Sexual Activity: Not on file   Other Topics Concern  . Not on file   Social History Narrative  . No narrative on file      Review of Systems  All other systems reviewed and are negative.       Objective:   Physical Exam  Vitals reviewed. Cardiovascular: Normal rate and normal heart sounds.  An irregularly irregular rhythm present.  Pulmonary/Chest: Effort normal and breath sounds normal.  Abdominal: Soft. Bowel sounds are normal. She exhibits no distension. There is no tenderness. There is no rebound.       Assessment & Plan:  1. A-fib Increase Coumadin to 2 mg every day except Mondays and Thursdays. On Mondays and Thursdays take 1 mg. Recheck INR in 2 weeks. - PT with INR/Fingerstick

## 2014-01-28 ENCOUNTER — Other Ambulatory Visit: Payer: Self-pay | Admitting: Family Medicine

## 2014-02-04 ENCOUNTER — Encounter: Payer: Self-pay | Admitting: *Deleted

## 2014-02-04 ENCOUNTER — Ambulatory Visit (INDEPENDENT_AMBULATORY_CARE_PROVIDER_SITE_OTHER): Payer: Medicare Other | Admitting: Family Medicine

## 2014-02-04 ENCOUNTER — Encounter: Payer: Self-pay | Admitting: Family Medicine

## 2014-02-04 VITALS — BP 130/72 | HR 62 | Temp 97.9°F | Resp 14 | Ht 61.0 in | Wt 133.0 lb

## 2014-02-04 DIAGNOSIS — I4891 Unspecified atrial fibrillation: Secondary | ICD-10-CM

## 2014-02-04 LAB — PT WITH INR/FINGERSTICK
INR, fingerstick: 1.6 — ABNORMAL HIGH (ref 0.80–1.20)
PT, fingerstick: 19 seconds — ABNORMAL HIGH (ref 10.4–12.5)

## 2014-02-04 NOTE — Progress Notes (Signed)
Subjective:    Patient ID: Heather [name], female    DOB: 10/11/1933, 80 y.o.   MRN: [id number]  HPI Patient is currently taking 2 mg every day and Coumadin except Monday and Thursday. On Monday and Thursday she takes 1 mg. Unfortunately INR only increased from 1.4 to 1.6. She is still subtherapeutic. Past Medical History  Diagnosis Date  . Hyperlipidemia   . Chronic LBP   . Gout 01/2012    right index and middle fingers  . Dysrhythmia     atrial fib  . Dementia   . Scoliosis   . Hypertension     under control  . Full dentures   . Wears glasses   . Pacemaker     dr croitoru-[facility]  . Arthritis   . GERD (gastroesophageal reflux disease)   . Atrial fibrillation   . Hiatal hernia   . Seizures 2009    seizure x 1; none since pacemaker insertion   Current Outpatient Prescriptions on File Prior to Visit  Medication Sig Dispense Refill  . alum hydroxide-mag trisilicate (GAVISCON) 80-20 MG CHEW chewable tablet Chew 2 tablets by mouth as needed for indigestion or heartburn.      . colchicine 0.6 MG tablet Take 0.6 mg by mouth daily.      . cyclobenzaprine (FLEXERIL) 10 MG tablet TAKE 1 TABLET BY MOUTH EVERY 8 HOURS **USE ONLY AS NEEDED FOR BACK PAIN**  90 tablet  1  . diltiazem (CARDIZEM CD) 240 MG 24 hr capsule TAKE 1 CAPSULE (240 MG TOTAL) BY MOUTH DAILY. PM  30 capsule  9  . esomeprazole (NEXIUM) 40 MG capsule Take 40 mg by mouth daily at 12 noon.      . fish oil-omega-3 fatty acids 1000 MG capsule Take 2 g by mouth daily.      . furosemide (LASIX) 40 MG tablet Take 40 mg by mouth daily as needed for fluid.      Marland Kitchen. losartan (COZAAR) 100 MG tablet TAKE 1 TABLET DAILY  90 tablet  1  . memantine (NAMENDA) 10 MG tablet Take 10 mg by mouth 2 (two) times daily.      . potassium chloride SA (K-DUR,KLOR-CON) 20 MEQ tablet Take 20 mEq by mouth as needed.      . sotalol (BETAPACE) 80 MG tablet Take 80 mg by mouth 2 (two) times daily.      Marland Kitchen. warfarin (COUMADIN) 1 MG tablet 1 mg om M,W,F,S and 2  mg on T,TH,SUN       No current facility-administered medications on file prior to visit.   Allergies  Allergen Reactions  . Oxycodone Hcl Other (See Comments)    HALLUCINATIONS  . Prednisone Other (See Comments)    Hallucinations  . Aspirin Other (See Comments)    GI UPSET  . Codeine Other (See Comments)    GI UPSET  . Sulfa Antibiotics Other (See Comments)    GI UPSET   History   Social History  . Marital Status: Married    Spouse Name: N/A    Number of Children: N/A  . Years of Education: N/A   Occupational History  . Not on file.   Social History Main Topics  . Smoking status: Never Smoker   . Smokeless tobacco: Never Used  . Alcohol Use: No  . Drug Use: No  . Sexual Activity: Not on file   Other Topics Concern  . Not on file   Social History Narrative  . No narrative on file  Review of Systems  All other systems reviewed and are negative.       Objective:   Physical Exam  Vitals reviewed. Cardiovascular: Normal rate and normal heart sounds.   Pulmonary/Chest: Effort normal and breath sounds normal.  Musculoskeletal: She exhibits no edema.          Assessment & Plan:  1. A-fib Change Coumadin to 2 mg every day except Monday and Friday. On Monday and Friday take 3 mg a day. Recheck INR in 2 weeks. - PT with INR/Fingerstick

## 2014-02-07 ENCOUNTER — Encounter: Payer: Self-pay | Admitting: Cardiovascular Disease

## 2014-02-07 ENCOUNTER — Ambulatory Visit (INDEPENDENT_AMBULATORY_CARE_PROVIDER_SITE_OTHER): Payer: Medicare Other | Admitting: Cardiovascular Disease

## 2014-02-07 VITALS — BP 102/60 | HR 61 | Ht 60.0 in | Wt 132.1 lb

## 2014-02-07 DIAGNOSIS — I4891 Unspecified atrial fibrillation: Secondary | ICD-10-CM

## 2014-02-07 DIAGNOSIS — R011 Cardiac murmur, unspecified: Secondary | ICD-10-CM

## 2014-02-07 DIAGNOSIS — Z950 Presence of cardiac pacemaker: Secondary | ICD-10-CM

## 2014-02-07 DIAGNOSIS — Z79899 Other long term (current) drug therapy: Secondary | ICD-10-CM

## 2014-02-07 LAB — BASIC METABOLIC PANEL
BUN: 28 mg/dL — AB (ref 6–23)
CO2: 26 meq/L (ref 19–32)
Calcium: 9.1 mg/dL (ref 8.4–10.5)
Chloride: 107 mEq/L (ref 96–112)
Creat: 1.2 mg/dL — ABNORMAL HIGH (ref 0.50–1.10)
GLUCOSE: 82 mg/dL (ref 70–99)
POTASSIUM: 4.4 meq/L (ref 3.5–5.3)
Sodium: 140 mEq/L (ref 135–145)

## 2014-02-07 LAB — PACEMAKER DEVICE OBSERVATION

## 2014-02-07 LAB — MAGNESIUM: Magnesium: 1.9 mg/dL (ref 1.5–2.5)

## 2014-02-07 NOTE — Patient Instructions (Addendum)
Remote monitoring is used to monitor your pacemaker from home. This monitoring reduces the number of office visits required to check your device to one time per year. It allows us to keep an eye on the functioning of your device to ensure it is working properly. You are scheduled for a device check from home on 05-11-2014. You may send your transmission at any time that day. If you have a wireless device, the transmission will be sent automatically. After your physician reviews your transmission, you will receive a postcard with your next transmission date.  Your physician recommends that you return for lab work today: BMP/MAG  Your physician recommends that you schedule a follow-up appointment in: 6 months with Dr.Croitoru

## 2014-02-08 LAB — MDC_IDC_ENUM_SESS_TYPE_INCLINIC
Battery Voltage: 2.96 V
Brady Statistic AP VP Percent: 1.4 %
Brady Statistic AS VP Percent: 13.33 %
Brady Statistic RA Percent Paced: 59.74 %
Brady Statistic RV Percent Paced: 14.73 %
Date Time Interrogation Session: 20150309130029
Lead Channel Impedance Value: 408 Ohm
Lead Channel Pacing Threshold Pulse Width: 0.4 ms
Lead Channel Sensing Intrinsic Amplitude: 7.8458
Lead Channel Setting Pacing Amplitude: 2 V
MDC IDC MSMT LEADCHNL RA IMPEDANCE VALUE: 424 Ohm
MDC IDC MSMT LEADCHNL RA PACING THRESHOLD AMPLITUDE: 1 V
MDC IDC MSMT LEADCHNL RV PACING THRESHOLD AMPLITUDE: 1 V
MDC IDC MSMT LEADCHNL RV PACING THRESHOLD PULSEWIDTH: 0.4 ms
MDC IDC SET LEADCHNL RA PACING AMPLITUDE: 2 V
MDC IDC SET LEADCHNL RV PACING PULSEWIDTH: 0.4 ms
MDC IDC SET LEADCHNL RV SENSING SENSITIVITY: 0.9 mV
MDC IDC SET ZONE DETECTION INTERVAL: 350 ms
MDC IDC SET ZONE DETECTION INTERVAL: 400 ms
MDC IDC STAT BRADY AP VS PERCENT: 58.34 %
MDC IDC STAT BRADY AS VS PERCENT: 26.93 %

## 2014-02-13 NOTE — Progress Notes (Signed)
Patient ID: Heather [name], female   DOB: 12-17-1932, 80 y.o.   MRN: [id number]      Reason for office visit Paroxysmal atrial fibrillation, pacemaker check  Heather [name] is a 80 year old woman with mild dementia who is here for followup on her dual chamber permanent pacemaker which was implanted for sinus node dysfunction and tachycardia bradycardia syndrome. The device was implanted in 2009 and is functioning normally. She is now essentially pacemaker dependent due to severe sinus bradycardia/sinus arrest. The burden of atrial fibrillation is fairly high (40%), but is unchanged from her previous trends and she appears to be oblivious to the arrhythmia. She takes chronic sotalol therapy. Her QTC is a little longer than usual today at 500 ms. One episode of nonsustained ventricular tachycardia 16 beats is seen, but the appearance is monomorphic not consistent with torsades de pointes. She takes chronic warfarin therapy and has not had bleeding complications or focal neurological events. Her only complaint today is mild swelling in her left ankle   Allergies  Allergen Reactions  . Oxycodone Hcl Other (See Comments)    HALLUCINATIONS  . Amiodarone     Loss of appetite  . Benazepril Cough  . Prednisone Other (See Comments)    Hallucinations  . Aspirin Other (See Comments)    GI UPSET  . Codeine Other (See Comments)    GI UPSET  . Sulfa Antibiotics Other (See Comments)    GI UPSET    Current Outpatient Prescriptions  Medication Sig Dispense Refill  . alum hydroxide-mag trisilicate (GAVISCON) 80-20 MG CHEW chewable tablet Chew 2 tablets by mouth as needed for indigestion or heartburn.      . colchicine 0.6 MG tablet Take 0.6 mg by mouth daily.      . cyclobenzaprine (FLEXERIL) 10 MG tablet TAKE 1 TABLET BY MOUTH EVERY 8 HOURS **USE ONLY AS NEEDED FOR BACK PAIN**  90 tablet  1  . diltiazem (CARDIZEM CD) 240 MG 24 hr capsule TAKE 1 CAPSULE (240 MG TOTAL) BY MOUTH DAILY. PM  30 capsule  9  .  esomeprazole (NEXIUM) 40 MG capsule Take 40 mg by mouth daily at 12 noon.      . fish oil-omega-3 fatty acids 1000 MG capsule Take 2 g by mouth daily.      . furosemide (LASIX) 40 MG tablet Take 40 mg by mouth daily as needed for fluid.      Marland Kitchen losartan (COZAAR) 100 MG tablet TAKE 1 TABLET DAILY  90 tablet  1  . memantine (NAMENDA) 10 MG tablet Take 10 mg by mouth 2 (two) times daily.      . potassium chloride SA (K-DUR,KLOR-CON) 20 MEQ tablet Take 20 mEq by mouth as needed.      . sotalol (BETAPACE) 80 MG tablet Take 80 mg by mouth 2 (two) times daily.      Marland Kitchen warfarin (COUMADIN) 1 MG tablet 1 mg om M,W,F,S and 2 mg on T,TH,SUN       No current facility-administered medications for this visit.    Past Medical History  Diagnosis Date  . Hyperlipidemia   . Chronic LBP   . Gout 01/2012    right index and middle fingers  . Dysrhythmia     atrial fib  . Dementia   . Scoliosis   . Hypertension     under control  . Full dentures   . Wears glasses   . Pacemaker     dr Kennis Wissmann[facility]  . Arthritis   .  GERD (gastroesophageal reflux disease)   . Atrial fibrillation   . Hiatal hernia   . Seizures 2009    seizure x 1; none since pacemaker insertion    Past Surgical History  Procedure Laterality Date  . Tonsillectomy    . Hernia repair    . Trigger finger release  04/24/2011    release A-1 pulley left index  . Carpal tunnel release  11/14/2009    right  . Appendectomy    . Abdominal hysterectomy    . Heel spur surgery      both feet  . Eye surgery      both cataracts  . Permanent pacemaker insertion  07/08/2008    Medtronic  . Shoulder arthroscopy  04/15/2006 - left    right:  10/18/2005, 09/24/2005  . Lumbar laminectomy/decompression microdiscectomy  02/06/2005; 10/21/2003    with fusions   . Back surgery  [id number]  . Cardioversion  08/08/2010    for atrial fib.  . Trigger finger release  08/10/2003    right thumb  . Wrist arthroscopy  07/04/2010    left wrist; also left CTR  .  Spinal growth rods    . Knee arthroscopy      right  . Trigger finger release  01/21/2012    Procedure: RELEASE TRIGGER FINGER/A-1 PULLEY;  Surgeon: Nicki Reaper, MD;  Location: [facility];  Service: Orthopedics;  Laterality: Right;  right ring finger  . I&d extremity  02/05/2012    Procedure: IRRIGATION AND DEBRIDEMENT EXTREMITY;  Surgeon: Nicki Reaper, MD;  Location: [facility];  Service: Orthopedics;  Laterality: Right;  debridement right index and middle fingers  . Trigger finger release Left 03/17/2013    Procedure: RELEASE TRIGGER FINGER/A-1 PULLEY LEFT MIDDLE, RING AND SMALL FINGERS;  Surgeon: Nicki Reaper, MD;  Location: [facility];  Service: Orthopedics;  Laterality: Left;  . Nm myocar perf wall motion  11/24/2007    No ischemia    Family History  Problem Relation Age of Onset  . Heart failure Mother   . Alzheimer's disease Father   . Stroke Sister     History   Social History  . Marital Status: Married    Spouse Name: N/A    Number of Children: N/A  . Years of Education: N/A   Occupational History  . Not on file.   Social History Main Topics  . Smoking status: Never Smoker   . Smokeless tobacco: Never Used  . Alcohol Use: No  . Drug Use: No  . Sexual Activity: Not on file   Other Topics Concern  . Not on file   Social History Narrative  . No narrative on file    Review of systems: The patient specifically denies any chest pain at rest or with exertion, dyspnea at rest or with exertion, orthopnea, paroxysmal nocturnal dyspnea, syncope, palpitations, focal neurological deficits, intermittent claudication, lower extremity edema, unexplained weight gain, cough, hemoptysis or wheezing.  The patient also denies abdominal pain, nausea, vomiting, dysphagia, diarrhea, constipation, polyuria, polydipsia, dysuria, hematuria, frequency, urgency, abnormal bleeding or bruising, fever, chills, unexpected weight changes, mood swings,  change in skin or hair texture, change in voice quality, auditory or visual problems, allergic reactions or rashes, new musculoskeletal complaints other than usual "aches and pains".   PHYSICAL EXAM BP 102/60  Pulse 61  Ht 5' (1.524 m)  Wt 59.92 kg (132 lb 1.6 oz)  BMI 25.80 kg/m2  General: Alert, oriented x3, no  distress Head: no evidence of trauma, PERRL, EOMI, no exophtalmos or lid lag, no myxedema, no xanthelasma; normal ears, nose and oropharynx Neck: normal jugular venous pulsations and no hepatojugular reflux; brisk carotid pulses without delay and no carotid bruits Chest: clear to auscultation, no signs of consolidation by percussion or palpation, normal fremitus, symmetrical and full respiratory excursions Cardiovascular: normal position and quality of the apical impulse, regular rhythm, normal first and second heart sounds, no rubs or gallops, holosystolic murmur at the left lower sternal border Abdomen: no tenderness or distention, no masses by palpation, no abnormal pulsatility or arterial bruits, normal bowel sounds, no hepatosplenomegaly Extremities: no clubbing, cyanosis or edema - I see little difference between her 2 ankles; 2+ radial, ulnar and brachial pulses bilaterally; 2+ right femoral, posterior tibial and dorsalis pedis pulses; 2+ left femoral, posterior tibial and dorsalis pedis pulses; no subclavian or femoral bruits Neurological: grossly nonfocal   EKG: Atrial paced, ventricular sensed, prolonged QT 500 ms, diffuse T-wave inversion  Lipid Panel     Component Value Date/Time   CHOL  Value: 178        ATP III CLASSIFICATION:  <200     mg/dL   Desirable  161-096  mg/dL   Borderline High  >=045    mg/dL   High        4/0/9811 0214   TRIG 76 08/05/2010 0214   HDL 51 08/05/2010 0214   CHOLHDL 3.5 08/05/2010 0214   VLDL 15 08/05/2010 0214   LDLCALC  Value: 112        Total Cholesterol/HDL:CHD Risk Coronary Heart Disease Risk Table                     Men   Women  1/2 Average  Risk   3.4   3.3  Average Risk       5.0   4.4  2 X Average Risk   9.6   7.1  3 X Average Risk  23.4   11.0        Use the calculated Patient Ratio above and the CHD Risk Table to determine the patient's CHD Risk.        ATP III CLASSIFICATION (LDL):  <100     mg/dL   Optimal  914-782  mg/dL   Near or Above                    Optimal  130-159  mg/dL   Borderline  956-213  mg/dL   High  >086     mg/dL   Very High* 04/07/8468 6295    BMET    Component Value Date/Time   NA 140 02/07/2014 1036   K 4.4 02/07/2014 1036   CL 107 02/07/2014 1036   CO2 26 02/07/2014 1036   GLUCOSE 82 02/07/2014 1036   BUN 28* 02/07/2014 1036   CREATININE 1.20* 02/07/2014 1036   CREATININE 0.80 09/16/2013 0526   CALCIUM 9.1 02/07/2014 1036   GFRNONAA 69* 09/16/2013 0526   GFRAA 80* 09/16/2013 0526     ASSESSMENT AND PLAN Atrial fibrillation, paroxysmal More aggressive therapy for her arrhythmia does not appear to be justified. I'm concerned with a prolonged QT which is secondary to sotalol but may also signify an electrolyte imbalances. She has been taking her potassium on a "as needed basis" and I have recommended that she take it daily, since she also takes daily furosemide. (On the average has been taking it only twice a week.  I'm not sure how her daughter decides to give her the potassium).  Pacemaker dual chamber implanted August 2009 for sinus node dysfunction Medtronic Enrhythm Normal device function. When not in atrial fibrillation, she paces the atrium 100%. Ventricular pacing occurs roughly 15% of the time. Continue Carelink monitoring  Heart murmur She has aortic valve sclerosis without stenosis, mild aortic insufficiency and moderate tricuspid insufficiency by previous echo   Patient Instructions  Remote monitoring is used to monitor your pacemaker from home. This monitoring reduces the number of office visits required to check your device to one time per year. It allows [place] to keep an eye on the functioning of your  device to ensure it is working properly. You are scheduled for a device check from home on 05-11-2014. You may send your transmission at any time that day. If you have a wireless device, the transmission will be sent automatically. After your physician reviews your transmission, you will receive a postcard with your next transmission date.  Your physician recommends that you return for lab work today: BMP/MAG  Your physician recommends that you schedule a follow-up appointment in: 6 months with Dr.Lynnie Koehler        Orders Placed This Encounter  Procedures  . Basic Metabolic Panel (BMET)  . Magnesium  . Implantable device check  . EKG 12-Lead   No orders of the defined types were placed in this encounter.    Junious Silk, MD, [facility] [phone] office [phone] pager

## 2014-02-13 NOTE — Assessment & Plan Note (Signed)
She has aortic valve sclerosis without stenosis, mild aortic insufficiency and moderate tricuspid insufficiency by previous echo

## 2014-02-13 NOTE — Assessment & Plan Note (Signed)
Normal device function. When not in atrial fibrillation, she paces the atrium 100%. Ventricular pacing occurs roughly 15% of the time. Continue Carelink monitoring

## 2014-02-13 NOTE — Assessment & Plan Note (Signed)
More aggressive therapy for her arrhythmia does not appear to be justified. I'm concerned with a prolonged QT which is secondary to sotalol but may also signify an electrolyte imbalances. She has been taking her potassium on a "as needed basis" and I have recommended that she take it daily, since she also takes daily furosemide. (On the average has been taking it only twice a week. I'm not sure how her daughter decides to give her the potassium).

## 2014-02-18 ENCOUNTER — Ambulatory Visit (INDEPENDENT_AMBULATORY_CARE_PROVIDER_SITE_OTHER): Payer: Medicare Other | Admitting: Family Medicine

## 2014-02-18 ENCOUNTER — Encounter: Payer: Self-pay | Admitting: Family Medicine

## 2014-02-18 VITALS — BP 110/68 | HR 78 | Temp 98.5°F | Resp 18 | Ht 61.0 in | Wt 138.0 lb

## 2014-02-18 DIAGNOSIS — I4891 Unspecified atrial fibrillation: Secondary | ICD-10-CM

## 2014-02-18 LAB — PT WITH INR/FINGERSTICK
INR FINGERSTICK: 2.6 — AB (ref 0.80–1.20)
PT FINGERSTICK: 31 s — AB (ref 10.4–12.5)

## 2014-02-18 NOTE — Progress Notes (Signed)
Subjective:    Patient ID: Heather [name], female    DOB: 04/22/1933, 80 y.o.   MRN: [id number]  HPI 02/04/14 Patient is currently taking 2 mg every day and Coumadin except Monday and Thursday. On Monday and Thursday she takes 1 mg. Unfortunately INR only increased from 1.4 to 1.6. She is still subtherapeutic.  At that time, my plan was: 1. A-fib Change Coumadin to 2 mg every day except Monday and Friday. On Monday and Friday take 3 mg a day. Recheck INR in 2 weeks. - PT with INR/Fingerstick  She is here today to recheck her Coumadin. Her INR is therapeutic at 2.6. She is taking 2 mg every day except Mondays and Fridays and on Mondays and Fridays she takes 3 mg. She denies any bleeding or bruising Past Medical History  Diagnosis Date  . Hyperlipidemia   . Chronic LBP   . Gout 01/2012    right index and middle fingers  . Dysrhythmia     atrial fib  . Dementia   . Scoliosis   . Hypertension     under control  . Full dentures   . Wears glasses   . Pacemaker     dr croitoru-[facility]  . Arthritis   . GERD (gastroesophageal reflux disease)   . Atrial fibrillation   . Hiatal hernia   . Seizures 2009    seizure x 1; none since pacemaker insertion   Current Outpatient Prescriptions on File Prior to Visit  Medication Sig Dispense Refill  . alum hydroxide-mag trisilicate (GAVISCON) 80-20 MG CHEW chewable tablet Chew 2 tablets by mouth as needed for indigestion or heartburn.      . colchicine 0.6 MG tablet Take 0.6 mg by mouth daily.      . cyclobenzaprine (FLEXERIL) 10 MG tablet TAKE 1 TABLET BY MOUTH EVERY 8 HOURS **USE ONLY AS NEEDED FOR BACK PAIN**  90 tablet  1  . diltiazem (CARDIZEM CD) 240 MG 24 hr capsule TAKE 1 CAPSULE (240 MG TOTAL) BY MOUTH DAILY. PM  30 capsule  9  . esomeprazole (NEXIUM) 40 MG capsule Take 40 mg by mouth daily at 12 noon.      . fish oil-omega-3 fatty acids 1000 MG capsule Take 2 g by mouth daily.      . furosemide (LASIX) 40 MG tablet Take 40 mg by mouth daily  as needed for fluid.      Marland Kitchen. losartan (COZAAR) 100 MG tablet TAKE 1 TABLET DAILY  90 tablet  1  . memantine (NAMENDA) 10 MG tablet Take 10 mg by mouth 2 (two) times daily.      . potassium chloride SA (K-DUR,KLOR-CON) 20 MEQ tablet Take 20 mEq by mouth as needed.      . sotalol (BETAPACE) 80 MG tablet Take 80 mg by mouth 2 (two) times daily.      Marland Kitchen. warfarin (COUMADIN) 1 MG tablet 2 mg every day except Monday and Friday. On Monday and Friday take 3 mg a day.       No current facility-administered medications on file prior to visit.   Allergies  Allergen Reactions  . Oxycodone Hcl Other (See Comments)    HALLUCINATIONS  . Amiodarone     Loss of appetite  . Benazepril Cough  . Prednisone Other (See Comments)    Hallucinations  . Aspirin Other (See Comments)    GI UPSET  . Codeine Other (See Comments)    GI UPSET  . Sulfa Antibiotics Other (See Comments)  GI UPSET   History   Social History  . Marital Status: Married    Spouse Name: N/A    Number of Children: N/A  . Years of Education: N/A   Occupational History  . Not on file.   Social History Main Topics  . Smoking status: Never Smoker   . Smokeless tobacco: Never Used  . Alcohol Use: No  . Drug Use: No  . Sexual Activity: Not on file   Other Topics Concern  . Not on file   Social History Narrative  . No narrative on file      Review of Systems  All other systems reviewed and are negative.       Objective:   Physical Exam  Vitals reviewed. Cardiovascular: Normal rate and normal heart sounds.   Pulmonary/Chest: Effort normal and breath sounds normal.  Musculoskeletal: She exhibits no edema.          Assessment & Plan:  1. A-fib Continue current Coumadin dose and recheck INR in 4 weeks. I recently treated the patient over the phone with Keflex for a cellulitis on her right third digit. The cellulitis has clinically resolved. No further antibiotics are required - PT with INR/Fingerstick

## 2014-03-04 ENCOUNTER — Other Ambulatory Visit: Payer: Self-pay | Admitting: Family Medicine

## 2014-03-04 NOTE — Telephone Encounter (Signed)
Refill appropriate and filled per protocol. 

## 2014-03-21 ENCOUNTER — Encounter: Payer: Self-pay | Admitting: Family Medicine

## 2014-03-21 ENCOUNTER — Ambulatory Visit (INDEPENDENT_AMBULATORY_CARE_PROVIDER_SITE_OTHER): Payer: Medicare Other | Admitting: Family Medicine

## 2014-03-21 VITALS — BP 110/82 | HR 68 | Temp 97.7°F | Resp 18 | Wt 142.0 lb

## 2014-03-21 DIAGNOSIS — I4891 Unspecified atrial fibrillation: Secondary | ICD-10-CM

## 2014-03-21 LAB — PT WITH INR/FINGERSTICK
INR, fingerstick: 3.3 — ABNORMAL HIGH (ref 0.80–1.20)
PT, fingerstick: 40.1 seconds — ABNORMAL HIGH (ref 10.4–12.5)

## 2014-03-21 NOTE — Progress Notes (Signed)
Subjective:    Patient ID: Heather [name], female    DOB: 05/15/1933, 80 y.o.   MRN: [id number]  HPI 02/04/14 Patient is currently taking 2 mg every day and Coumadin except Monday and Thursday. On Monday and Thursday she takes 1 mg. Unfortunately INR only increased from 1.4 to 1.6. She is still subtherapeutic.  At that time, my plan was: 1. A-fib Change Coumadin to 2 mg every day except Monday and Friday. On Monday and Friday take 3 mg a day. Recheck INR in 2 weeks. - PT with INR/Fingerstick 02/18/14 She is here today to recheck her Coumadin. Her INR is therapeutic at 2.6. She is taking 2 mg every day except Mondays and Fridays and on Mondays and Fridays she takes 3 mg. She denies any bleeding or bruising.  At that time, my plan was:  1. A-fib Continue current Coumadin dose and recheck INR in 4 weeks. I recently treated the patient over the phone with Keflex for a cellulitis on her right third digit. The cellulitis has clinically resolved. No further antibiotics are required - PT with INR/Fingerstick  03/21/14 She is here today for recheck. INR today in clinic is 3.3. The patient denies any bleeding or bruising. She does complain about more swelling in her legs but she is not keeping her legs elevated  Past Medical History  Diagnosis Date  . Hyperlipidemia   . Chronic LBP   . Gout 01/2012    right index and middle fingers  . Dysrhythmia     atrial fib  . Dementia   . Scoliosis   . Hypertension     under control  . Full dentures   . Wears glasses   . Pacemaker     dr croitoru-[facility]  . Arthritis   . GERD (gastroesophageal reflux disease)   . Atrial fibrillation   . Hiatal hernia   . Seizures 2009    seizure x 1; none since pacemaker insertion   Current Outpatient Prescriptions on File Prior to Visit  Medication Sig Dispense Refill  . alum hydroxide-mag trisilicate (GAVISCON) 80-20 MG CHEW chewable tablet Chew 2 tablets by mouth as needed for indigestion or heartburn.      .  colchicine 0.6 MG tablet Take 0.6 mg by mouth daily.      . cyclobenzaprine (FLEXERIL) 10 MG tablet TAKE 1 TABLET BY MOUTH EVERY 8 HOURS **USE ONLY AS NEEDED FOR BACK PAIN**  90 tablet  1  . diltiazem (CARDIZEM CD) 240 MG 24 hr capsule TAKE 1 CAPSULE (240 MG TOTAL) BY MOUTH DAILY. PM  30 capsule  9  . esomeprazole (NEXIUM) 40 MG capsule Take 40 mg by mouth daily at 12 noon.      . fish oil-omega-3 fatty acids 1000 MG capsule Take 2 g by mouth daily.      . furosemide (LASIX) 40 MG tablet Take 40 mg by mouth daily as needed for fluid.      Marland Kitchen. losartan (COZAAR) 100 MG tablet TAKE 1 TABLET DAILY  90 tablet  1  . memantine (NAMENDA) 10 MG tablet Take 10 mg by mouth 2 (two) times daily.      . potassium chloride SA (K-DUR,KLOR-CON) 20 MEQ tablet Take 20 mEq by mouth as needed.      . sotalol (BETAPACE) 80 MG tablet Take 80 mg by mouth 2 (two) times daily.      Marland Kitchen. warfarin (COUMADIN) 1 MG tablet 2 mg every day except Monday and Friday. On Monday and  Friday take 3 mg a day.      . warfarin (COUMADIN) 1 MG tablet Take (3) tabs on Monday and Friday, and take (2) tabs on TWTSS.  90 tablet  5   No current facility-administered medications on file prior to visit.   Allergies  Allergen Reactions  . Oxycodone Hcl Other (See Comments)    HALLUCINATIONS  . Amiodarone     Loss of appetite  . Benazepril Cough  . Prednisone Other (See Comments)    Hallucinations  . Aspirin Other (See Comments)    GI UPSET  . Codeine Other (See Comments)    GI UPSET  . Sulfa Antibiotics Other (See Comments)    GI UPSET   History   Social History  . Marital Status: Married    Spouse Name: N/A    Number of Children: N/A  . Years of Education: N/A   Occupational History  . Not on file.   Social History Main Topics  . Smoking status: Never Smoker   . Smokeless tobacco: Never Used  . Alcohol Use: No  . Drug Use: No  . Sexual Activity: Not on file   Other Topics Concern  . Not on file   Social History  Narrative  . No narrative on file      Review of Systems  All other systems reviewed and are negative.      Objective:   Physical Exam  Vitals reviewed. Cardiovascular: Normal rate and normal heart sounds.   Pulmonary/Chest: Effort normal and breath sounds normal.  Musculoskeletal: She exhibits no edema.          Assessment & Plan:  1. A-fib Heart the Coumadin dose today. Resume her regular dose tomorrow, 3 mg on Mondays and Thursdays and 2 mg on all other days. Recheck INR in one month. I recommended that she increase her Lasix to 40 mg every day for the next 4 days and then resume PRN dosing thereafter. - PT with INR/Fingerstick

## 2014-03-24 ENCOUNTER — Other Ambulatory Visit: Payer: Self-pay | Admitting: Family Medicine

## 2014-03-31 ENCOUNTER — Encounter: Payer: Self-pay | Admitting: Cardiovascular Disease

## 2014-04-08 ENCOUNTER — Ambulatory Visit (INDEPENDENT_AMBULATORY_CARE_PROVIDER_SITE_OTHER): Payer: Medicare Other | Admitting: Family Medicine

## 2014-04-08 ENCOUNTER — Encounter: Payer: Self-pay | Admitting: Family Medicine

## 2014-04-08 VITALS — BP 126/68 | HR 76 | Temp 98.1°F | Resp 18

## 2014-04-08 DIAGNOSIS — W19XXXA Unspecified fall, initial encounter: Secondary | ICD-10-CM

## 2014-04-08 DIAGNOSIS — Y92009 Unspecified place in unspecified non-institutional (private) residence as the place of occurrence of the external cause: Secondary | ICD-10-CM

## 2014-04-08 DIAGNOSIS — R609 Edema, unspecified: Secondary | ICD-10-CM

## 2014-04-08 DIAGNOSIS — M25569 Pain in unspecified knee: Secondary | ICD-10-CM

## 2014-04-08 MED ORDER — TRAMADOL HCL 50 MG PO TABS: 50.0000 mg | ORAL_TABLET | Freq: Two times a day (BID) | ORAL | Status: DC | PRN

## 2014-04-08 NOTE — Patient Instructions (Signed)
Ultram for pain twice a day  ICE the knee for 20 minutes three times a day Elevate the feet Give extra dose of lasix today around 5-6pm  Xray of knee on Monday if not better F/U as previous Dr. Tanya NonesPickard

## 2014-04-08 NOTE — Assessment & Plan Note (Signed)
Based on her examination at I'm sure that she has osteoarthritis already in her knees. She does have some mild swelling however she is now more mobile than she was the past couple of days. She has some bruising on both knees as well as the arm from the fall. I'm reluctant to do any type of aspiration. She's also on long-term Coumadin. I have given her some tramadol to take there were also ice the knee and elevate. She still complained of pain next week I will obtain x-ray. She's not had any change in her back pain and no hip pain

## 2014-04-08 NOTE — Progress Notes (Signed)
Patient ID: Heather [name], female   DOB: 10/18/1933, 80 y.o.   MRN: [id number]   Subjective:    Patient ID: Heather [name], female    DOB: 07/31/1933, 80 y.o.   MRN: [id number]  Patient presents for Post Fall on Monday, 04/04/14  patient here with her husband. She has no history of atrial fibrillation on Coumadin therapy as well as chronic kidney disease due to dementia hypertension. She was out in her yard when she slipped on some gravel and fell to the concrete on Monday. Initially after the fall she was up and moving about however couple days later and they noticed that she was more stiff getting out of the chair and that she was complaining of knee pain. Ice was applied by the family in a couple of doses of Tylenol were given. Her husband states now that she is moving around much better in she is able to touch her knee without any significant pain. He does want to get things checked out. 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 She walks with a cane at baseline    Review Of Systems:  GEN- denies fatigue, fever, weight loss,weakness, recent illness HEENT- denies eye drainage, change in vision, nasal discharge, CVS- denies chest pain, palpitations RESP- denies SOB, cough, wheeze ABD- denies N/V, change in stools, abd pain GU- denies dysuria, hematuria, dribbling, incontinence MSK- +joint pain, muscle aches, +injury Neuro- denies headache, dizziness, syncope, seizure activity       Objective:    BP 126/68  Pulse 76  Temp(Src) 98.1 F (36.7 C) (Oral)  Resp 18 GEN- NAD, alert and oriented x3 HEENT- PERRL, EOMI, non injected sclera, pink conjunctiva, MMM, oropharynx clear Neck- Supple, CVS-irregular rhythm, normal rate, no murmur RESP-CTAB ABD-NABS,soft,NT,ND MSK- Mild TTP lumbar spine, Neg SLR, decreased ROM SPINE, HIPS, KNEES, Right knee- mild swelling- mild bruising over patella- non tender to palpation, left knee- bruising, no effusion, crepitus bilat knees EXT- 1+  pitting edema bilat Pulses- Radial 2+ Skin- ecchymosis R forearm-fading        Assessment & Plan:      Problem List Items Addressed This Visit   None      Note: This dictation was prepared with Dragon dictation along with smaller phrase technology. Any transcriptional errors that result from this process are unintentional.

## 2014-04-08 NOTE — Assessment & Plan Note (Signed)
She has chronic edema. She's currently on Lasix. I will have him give an extra dose of Lasix this afternoon since her fall and her decreased ability she's had some increased swelling. She will then resume her typical 40 mg daily

## 2014-04-11 ENCOUNTER — Telehealth: Payer: Self-pay | Admitting: *Deleted

## 2014-04-11 ENCOUNTER — Ambulatory Visit
Admission: RE | Admit: 2014-04-11 | Discharge: 2014-04-11 | Disposition: A | Discharge status: Home / Self Care | Payer: 59 | Source: Ambulatory Visit | Attending: Family Medicine | Admitting: Family Medicine

## 2014-04-11 DIAGNOSIS — R52 Pain, unspecified: Secondary | ICD-10-CM

## 2014-04-11 NOTE — Telephone Encounter (Signed)
noted 

## 2014-04-11 NOTE — Telephone Encounter (Signed)
Call placed to husband.   Reports that the knee is still painful, but she is moving around a bit more freely.   Agreed to x-ray. Orders placed.

## 2014-04-11 NOTE — Telephone Encounter (Signed)
Also do xray of pelvis/HIP- Dx Fall, leg pain

## 2014-04-11 NOTE — Telephone Encounter (Signed)
Call placed to patient and patient husband made aware.   Orders placed.

## 2014-04-14 ENCOUNTER — Other Ambulatory Visit: Payer: Self-pay | Admitting: Family Medicine

## 2014-04-14 NOTE — Telephone Encounter (Signed)
Medication refilled per protocol. 

## 2014-04-19 ENCOUNTER — Ambulatory Visit (INDEPENDENT_AMBULATORY_CARE_PROVIDER_SITE_OTHER): Payer: Medicare Other | Admitting: Family Medicine

## 2014-04-19 ENCOUNTER — Encounter: Payer: Self-pay | Admitting: Family Medicine

## 2014-04-19 VITALS — BP 130/72 | HR 72 | Temp 98.6°F | Resp 18 | Ht 61.0 in | Wt 142.0 lb

## 2014-04-19 DIAGNOSIS — I4891 Unspecified atrial fibrillation: Secondary | ICD-10-CM

## 2014-04-19 LAB — PT WITH INR/FINGERSTICK
INR FINGERSTICK: 2.7 — AB (ref 0.80–1.20)
PT, fingerstick: 32.5 seconds — ABNORMAL HIGH (ref 10.4–12.5)

## 2014-04-19 NOTE — Progress Notes (Signed)
Subjective:    Patient ID: Heather [name], female    DOB: 12/19/1932, 80 y.o.   MRN: [id number]  HPI Patient recently fell and sustained a fracture to her right patella. She is currently wearing a knee brace. She is no longer in any pain. She is ambulating with a cane minimal difficulty. She is able to go up and down the steps of her home with assistance. Orthopedics did not recommend any kind of casting or surgery to correct the fracture. She is here today for a Coumadin check. Her INR is therapeutic at 3.7. Neither the patient her husband has any medical concerns. Past Medical History  Diagnosis Date  . Hyperlipidemia   . Chronic LBP   . Gout 01/2012    right index and middle fingers  . Dysrhythmia     atrial fib  . Dementia   . Scoliosis   . Hypertension     under control  . Full dentures   . Wears glasses   . Pacemaker     dr croitoru[facility]  . Arthritis   . GERD (gastroesophageal reflux disease)   . Atrial fibrillation   . Hiatal hernia   . Seizures 2009    seizure x 1; none since pacemaker insertion   Current Outpatient Prescriptions on File Prior to Visit  Medication Sig Dispense Refill  . alum hydroxide-mag trisilicate (GAVISCON) 80-20 MG CHEW chewable tablet Chew 2 tablets by mouth as needed for indigestion or heartburn.      . colchicine 0.6 MG tablet Take 0.6 mg by mouth daily.      . cyclobenzaprine (FLEXERIL) 10 MG tablet TAKE 1 TABLET BY MOUTH EVERY 8 HOURS **USE ONLY AS NEEDED FOR BACK PAIN**  90 tablet  1  . diltiazem (CARDIZEM CD) 240 MG 24 hr capsule TAKE 1 CAPSULE (240 MG TOTAL) BY MOUTH DAILY. PM  30 capsule  9  . esomeprazole (NEXIUM) 40 MG capsule Take 40 mg by mouth daily at 12 noon.      . fish oil-omega-3 fatty acids 1000 MG capsule Take 2 g by mouth daily.      . furosemide (LASIX) 40 MG tablet Take 40 mg by mouth daily as needed for fluid.      Marland Kitchen. losartan (COZAAR) 100 MG tablet TAKE 1 TABLET DAILY  90 tablet  1  . memantine (NAMENDA) 10 MG tablet Take 10  mg by mouth 2 (two) times daily.      Marland Kitchen. NEXIUM 40 MG capsule TAKE 1 CAPSULE BY MOUTH DAILY BEFORE BREAKFAST.  30 capsule  11  . potassium chloride SA (K-DUR,KLOR-CON) 20 MEQ tablet Take 20 mEq by mouth as needed.      . sotalol (BETAPACE) 80 MG tablet Take 80 mg by mouth 2 (two) times daily.      . sotalol (BETAPACE) 80 MG tablet TAKE 1 TABLET BY MOUTH TWICE A DAY  60 tablet  5  . traMADol (ULTRAM) 50 MG tablet Take 1 tablet (50 mg total) by mouth 2 (two) times daily as needed.  45 tablet  0   No current facility-administered medications on file prior to visit.   Allergies  Allergen Reactions  . Oxycodone Hcl Other (See Comments)    HALLUCINATIONS  . Amiodarone     Loss of appetite  . Benazepril Cough  . Prednisone Other (See Comments)    Hallucinations  . Aspirin Other (See Comments)    GI UPSET  . Codeine Other (See Comments)    GI  UPSET  . Sulfa Antibiotics Other (See Comments)    GI UPSET   History   Social History  . Marital Status: Married    Spouse Name: N/A    Number of Children: N/A  . Years of Education: N/A   Occupational History  . Not on file.   Social History Main Topics  . Smoking status: Never Smoker   . Smokeless tobacco: Never Used  . Alcohol Use: No  . Drug Use: No  . Sexual Activity: Not on file   Other Topics Concern  . Not on file   Social History Narrative  . No narrative on file      Review of Systems  All other systems reviewed and are negative.      Objective:   Physical Exam  Vitals reviewed. Constitutional: She appears well-developed and well-nourished.  Cardiovascular: Normal rate and regular rhythm.   Murmur heard. Pulmonary/Chest: Effort normal and breath sounds normal. No respiratory distress. She has no wheezes. She has no rales.  Abdominal: Soft. Bowel sounds are normal. She exhibits no distension. There is no tenderness. There is no rebound and no guarding.  Musculoskeletal: She exhibits no edema.            Assessment & Plan:  1. A-fib INR today is therapeutic. Continue Coumadin at present dose. Recheck in 4 weeks. I anticipate gradual healing of the patellar fracture over the next 4-6 weeks. I recommended that the patient continue to wear her knee brace. - PT with INR/Fingerstick

## 2014-04-21 ENCOUNTER — Ambulatory Visit: Payer: Medicare Other | Admitting: Family Medicine

## 2014-04-26 ENCOUNTER — Other Ambulatory Visit: Payer: Self-pay | Admitting: Family Medicine

## 2014-04-27 ENCOUNTER — Other Ambulatory Visit: Payer: Self-pay | Admitting: Cardiovascular Disease

## 2014-04-28 NOTE — Telephone Encounter (Signed)
Rx refill sent to patient pharmacy   

## 2014-05-11 ENCOUNTER — Ambulatory Visit (INDEPENDENT_AMBULATORY_CARE_PROVIDER_SITE_OTHER): Payer: Medicare Other | Admitting: *Deleted

## 2014-05-11 ENCOUNTER — Other Ambulatory Visit: Payer: Self-pay | Admitting: *Deleted

## 2014-05-11 ENCOUNTER — Telehealth: Payer: Self-pay | Admitting: Cardiology

## 2014-05-11 ENCOUNTER — Other Ambulatory Visit: Payer: Self-pay | Admitting: Family Medicine

## 2014-05-11 DIAGNOSIS — I4891 Unspecified atrial fibrillation: Secondary | ICD-10-CM

## 2014-05-11 MED ORDER — DILTIAZEM HCL ER COATED BEADS 240 MG PO CP24: 240.0000 mg | ORAL_CAPSULE | Freq: Every day | ORAL | Status: DC

## 2014-05-11 MED ORDER — ESOMEPRAZOLE MAGNESIUM 40 MG PO CPDR: DELAYED_RELEASE_CAPSULE | ORAL | Status: DC

## 2014-05-11 MED ORDER — SOTALOL HCL 80 MG PO TABS: ORAL_TABLET | ORAL | Status: DC

## 2014-05-11 NOTE — Progress Notes (Signed)
Remote pacemaker transmission.   

## 2014-05-11 NOTE — Telephone Encounter (Signed)
Spoke with pt and reminded pt of remote transmission that is due today. Pt verbalized understanding.   

## 2014-05-11 NOTE — Telephone Encounter (Signed)
Rx Refilled  

## 2014-05-11 NOTE — Telephone Encounter (Signed)
Rx was sent to pharmacy electronically. 

## 2014-05-13 ENCOUNTER — Ambulatory Visit (INDEPENDENT_AMBULATORY_CARE_PROVIDER_SITE_OTHER): Payer: Medicare Other | Admitting: Family Medicine

## 2014-05-13 ENCOUNTER — Encounter: Payer: Self-pay | Admitting: Family Medicine

## 2014-05-13 VITALS — BP 122/76 | HR 72 | Temp 98.0°F | Resp 14 | Ht 61.0 in | Wt 130.0 lb

## 2014-05-13 DIAGNOSIS — I4891 Unspecified atrial fibrillation: Secondary | ICD-10-CM

## 2014-05-13 LAB — PT WITH INR/FINGERSTICK
INR FINGERSTICK: 2.4 — AB (ref 0.80–1.20)
PT, fingerstick: 29.3 seconds — ABNORMAL HIGH (ref 10.4–12.5)

## 2014-05-13 NOTE — Progress Notes (Signed)
Subjective:    Patient ID: Heather [name], female    DOB: 09/10/1933, 80 y.o.   MRN: [id number]  HPI 04/19/14 Patient recently fell and sustained a fracture to her right patella. She is currently wearing a knee brace. She is no longer in any pain. She is ambulating with a cane minimal difficulty. She is able to go up and down the steps of her home with assistance. Orthopedics did not recommend any kind of casting or surgery to correct the fracture. She is here today for a Coumadin check. Her INR is therapeutic at 2.7. Neither the patient her husband has any medical concerns.  At that time, my plan was: 1. A-fib INR today is therapeutic. Continue Coumadin at present dose. Recheck in 4 weeks. I anticipate gradual healing of the patellar fracture over the next 4-6 weeks. I recommended that the patient continue to wear her knee brace. - PT with INR/Fingerstick  05/13/14 Patient is here today to recheck her INR.   INR is therapeutic at 2.4. Patient's blood pressure is well controlled. She denies any chest pain or shortness of breath. She is no longer having any pain in her right knee. She is now ambulating using a cane without wearing her knee brace and overall she is doing well. However her weight has dropped substantially. Isand she is eating well. Wt Readings from Last 3 Encounters:  05/13/14 130 lb (58.968 kg)  04/19/14 142 lb (64.411 kg)  03/21/14 142 lb (64.411 kg)    Past Medical History  Diagnosis Date  . Hyperlipidemia   . Chronic LBP   . Gout 01/2012    right index and middle fingers  . Dysrhythmia     atrial fib  . Dementia   . Scoliosis   . Hypertension     under control  . Full dentures   . Wears glasses   . Pacemaker     dr croitoru[facility]  . Arthritis   . GERD (gastroesophageal reflux disease)   . Atrial fibrillation   . Hiatal hernia   . Seizures 2009    seizure x 1; none since pacemaker insertion   Current Outpatient Prescriptions on File Prior to Visit  Medication  Sig Dispense Refill  . alum hydroxide-mag trisilicate (GAVISCON) 80-20 MG CHEW chewable tablet Chew 2 tablets by mouth as needed for indigestion or heartburn.      . colchicine 0.6 MG tablet Take 0.6 mg by mouth daily.      . cyclobenzaprine (FLEXERIL) 10 MG tablet Take 1 tablet (10 mg total) by mouth every 8 (eight) hours.  90 tablet  1  . diltiazem (CARDIZEM CD) 240 MG 24 hr capsule Take 1 capsule (240 mg total) by mouth daily.  90 capsule  2  . esomeprazole (NEXIUM) 40 MG capsule Take 40 mg by mouth daily at 12 noon.      Marland Kitchen. esomeprazole (NEXIUM) 40 MG capsule TAKE 1 CAPSULE BY MOUTH DAILY BEFORE BREAKFAST.  90 capsule  3  . fish oil-omega-3 fatty acids 1000 MG capsule Take 2 g by mouth daily.      . furosemide (LASIX) 40 MG tablet Take 40 mg by mouth daily as needed for fluid.      . furosemide (LASIX) 40 MG tablet TAKE 1 TABLET BY MOUTH DAILY AS NEEDED FOR LEG SWELLING  30 tablet  3  . losartan (COZAAR) 100 MG tablet TAKE 1 TABLET DAILY  90 tablet  1  . memantine (NAMENDA) 10 MG tablet Take 10  mg by mouth 2 (two) times daily.      . potassium chloride SA (K-DUR,KLOR-CON) 20 MEQ tablet Take 20 mEq by mouth as needed.      . sotalol (BETAPACE) 80 MG tablet Take 80 mg by mouth 2 (two) times daily.      . sotalol (BETAPACE) 80 MG tablet TAKE 1 TABLET BY MOUTH TWICE A DAY  180 tablet  3  . traMADol (ULTRAM) 50 MG tablet Take 1 tablet (50 mg total) by mouth 2 (two) times daily as needed.  45 tablet  0  . warfarin (COUMADIN) 1 MG tablet 3 mg on Mondays and Thursdays and 2 mg on all other days.       No current facility-administered medications on file prior to visit.   Allergies  Allergen Reactions  . Oxycodone Hcl Other (See Comments)    HALLUCINATIONS  . Amiodarone     Loss of appetite  . Benazepril Cough  . Prednisone Other (See Comments)    Hallucinations  . Aspirin Other (See Comments)    GI UPSET  . Codeine Other (See Comments)    GI UPSET  . Sulfa Antibiotics Other (See Comments)     GI UPSET   History   Social History  . Marital Status: Married    Spouse Name: N/A    Number of Children: N/A  . Years of Education: N/A   Occupational History  . Not on file.   Social History Main Topics  . Smoking status: Never Smoker   . Smokeless tobacco: Never Used  . Alcohol Use: No  . Drug Use: No  . Sexual Activity: Not on file   Other Topics Concern  . Not on file   Social History Narrative  . No narrative on file      Review of Systems  All other systems reviewed and are negative.      Objective:   Physical Exam  Vitals reviewed. Constitutional: She appears well-developed and well-nourished.  Cardiovascular: Normal rate and regular rhythm.   Murmur heard. Pulmonary/Chest: Effort normal and breath sounds normal. No respiratory distress. She has no wheezes. She has no rales.  Abdominal: Soft. Bowel sounds are normal. She exhibits no distension. There is no tenderness. There is no rebound and no guarding.  Musculoskeletal: She exhibits edema.   has trace bipedal edema        Assessment & Plan:   1. Atrial fibrillation Coumadin is therapeutic. Continue at its current dose and recheck INR in 4-6 weeks. Monitor weight closely. If weight continues to drop, I would resume Megace. - PT with INR/Fingerstick; Standing - PT with INR/Fingerstick

## 2014-05-23 LAB — MDC_IDC_ENUM_SESS_TYPE_REMOTE
Battery Voltage: 2.95 V
Brady Statistic AP VP Percent: 2.1 %
Brady Statistic AS VP Percent: 41.5 %
Brady Statistic AS VS Percent: 46.45 %
Brady Statistic RA Percent Paced: 12.05 %
Brady Statistic RV Percent Paced: 43.6 %
Date Time Interrogation Session: 20150610153841
Lead Channel Setting Pacing Amplitude: 2 V
MDC IDC MSMT LEADCHNL RA IMPEDANCE VALUE: 512 Ohm
MDC IDC MSMT LEADCHNL RV IMPEDANCE VALUE: 416 Ohm
MDC IDC SET LEADCHNL RA PACING AMPLITUDE: 2 V
MDC IDC SET LEADCHNL RV PACING PULSEWIDTH: 0.4 ms
MDC IDC SET LEADCHNL RV SENSING SENSITIVITY: 0.9 mV
MDC IDC SET ZONE DETECTION INTERVAL: 400 ms
MDC IDC STAT BRADY AP VS PERCENT: 9.95 %
Zone Setting Detection Interval: 350 ms

## 2014-06-13 ENCOUNTER — Ambulatory Visit (INDEPENDENT_AMBULATORY_CARE_PROVIDER_SITE_OTHER): Payer: Medicare Other | Admitting: Family Medicine

## 2014-06-13 ENCOUNTER — Encounter: Payer: Self-pay | Admitting: Family Medicine

## 2014-06-13 VITALS — BP 120/60 | HR 64 | Temp 98.1°F | Resp 16 | Ht 61.0 in | Wt 130.0 lb

## 2014-06-13 DIAGNOSIS — I4891 Unspecified atrial fibrillation: Secondary | ICD-10-CM

## 2014-06-13 LAB — PT WITH INR/FINGERSTICK
INR FINGERSTICK: 3.7 — AB (ref 0.80–1.20)
PT FINGERSTICK: 44.7 s — AB (ref 10.4–12.5)

## 2014-06-13 NOTE — Progress Notes (Signed)
Subjective:    Patient ID: Heather [name], female    DOB: 10/11/1933, 80 y.o.   MRN: [id number]  HPI Patient has a history of atrial fibrillation. She is currently on Coumadin 3 mg on Monday and Friday and 2 mg all other days. Her INR has been stable the last several office visits. However today it is super therapeutic at 3.7.  She denies any recent antibiotics or medicine changes. She denies and dietary changes. Her weight is stable at 130 pounds. She denies any bleeding or bruising. Past Medical History  Diagnosis Date  . Hyperlipidemia   . Chronic LBP   . Gout 01/2012    right index and middle fingers  . Dysrhythmia     atrial fib  . Dementia   . Scoliosis   . Hypertension     under control  . Full dentures   . Wears glasses   . Pacemaker     dr croitoru[facility]  . Arthritis   . GERD (gastroesophageal reflux disease)   . Atrial fibrillation   . Hiatal hernia   . Seizures 2009    seizure x 1; none since pacemaker insertion   Current Outpatient Prescriptions on File Prior to Visit  Medication Sig Dispense Refill  . alum hydroxide-mag trisilicate (GAVISCON) 80-20 MG CHEW chewable tablet Chew 2 tablets by mouth as needed for indigestion or heartburn.      . colchicine 0.6 MG tablet Take 0.6 mg by mouth daily.      . cyclobenzaprine (FLEXERIL) 10 MG tablet Take 1 tablet (10 mg total) by mouth every 8 (eight) hours.  90 tablet  1  . diltiazem (CARDIZEM CD) 240 MG 24 hr capsule Take 1 capsule (240 mg total) by mouth daily.  90 capsule  2  . esomeprazole (NEXIUM) 40 MG capsule Take 40 mg by mouth daily at 12 noon.      Marland Kitchen. esomeprazole (NEXIUM) 40 MG capsule TAKE 1 CAPSULE BY MOUTH DAILY BEFORE BREAKFAST.  90 capsule  3  . fish oil-omega-3 fatty acids 1000 MG capsule Take 2 g by mouth daily.      . furosemide (LASIX) 40 MG tablet Take 40 mg by mouth daily as needed for fluid.      . furosemide (LASIX) 40 MG tablet TAKE 1 TABLET BY MOUTH DAILY AS NEEDED FOR LEG SWELLING  30 tablet  3  .  losartan (COZAAR) 100 MG tablet TAKE 1 TABLET DAILY  90 tablet  1  . memantine (NAMENDA) 10 MG tablet Take 10 mg by mouth 2 (two) times daily.      . potassium chloride SA (K-DUR,KLOR-CON) 20 MEQ tablet Take 20 mEq by mouth as needed.      . sotalol (BETAPACE) 80 MG tablet Take 80 mg by mouth 2 (two) times daily.      . sotalol (BETAPACE) 80 MG tablet TAKE 1 TABLET BY MOUTH TWICE A DAY  180 tablet  3  . traMADol (ULTRAM) 50 MG tablet Take 1 tablet (50 mg total) by mouth 2 (two) times daily as needed.  45 tablet  0  . warfarin (COUMADIN) 1 MG tablet 3 mg on Mondays and Thursdays and 2 mg on all other days.       No current facility-administered medications on file prior to visit.   Allergies  Allergen Reactions  . Oxycodone Hcl Other (See Comments)    HALLUCINATIONS  . Amiodarone     Loss of appetite  . Benazepril Cough  . Prednisone  Other (See Comments)    Hallucinations  . Aspirin Other (See Comments)    GI UPSET  . Codeine Other (See Comments)    GI UPSET  . Sulfa Antibiotics Other (See Comments)    GI UPSET   History   Social History  . Marital Status: Married    Spouse Name: N/A    Number of Children: N/A  . Years of Education: N/A   Occupational History  . Not on file.   Social History Main Topics  . Smoking status: Never Smoker   . Smokeless tobacco: Never Used  . Alcohol Use: No  . Drug Use: No  . Sexual Activity: Not on file   Other Topics Concern  . Not on file   Social History Narrative  . No narrative on file      Review of Systems  All other systems reviewed and are negative.      Objective:   Physical Exam  Vitals reviewed. Cardiovascular: Normal rate.  An irregularly irregular rhythm present.  Pulmonary/Chest: Effort normal and breath sounds normal. No respiratory distress. She has no wheezes. She has no rales.  Abdominal: Soft. Bowel sounds are normal.  Musculoskeletal: She exhibits no edema.          Assessment & Plan:  1. Atrial  fibrillation Patient has been fairly stable. Therefore I will temporarily hold Coumadin today. I will then have her resume her previous chronic dose. I would recheck INR in 2 weeks. If INR remains elevated at that time I would decrease her dose permanently to 2 mg poqday. - PT with INR/Fingerstick

## 2014-06-15 ENCOUNTER — Encounter: Payer: Self-pay | Admitting: Cardiology

## 2014-06-21 ENCOUNTER — Encounter: Payer: Self-pay | Admitting: Cardiovascular Disease

## 2014-07-14 ENCOUNTER — Ambulatory Visit (INDEPENDENT_AMBULATORY_CARE_PROVIDER_SITE_OTHER): Payer: Medicare Other | Admitting: Family Medicine

## 2014-07-14 ENCOUNTER — Encounter: Payer: Self-pay | Admitting: Family Medicine

## 2014-07-14 VITALS — BP 130/80 | HR 78 | Temp 98.0°F | Resp 12 | Ht 61.0 in | Wt 131.0 lb

## 2014-07-14 DIAGNOSIS — I4891 Unspecified atrial fibrillation: Secondary | ICD-10-CM

## 2014-07-14 LAB — PT WITH INR/FINGERSTICK
INR, fingerstick: 2.9 — ABNORMAL HIGH (ref 0.80–1.20)
PT, fingerstick: 34.8 seconds — ABNORMAL HIGH (ref 10.4–12.5)

## 2014-07-14 NOTE — Progress Notes (Signed)
Subjective:    Patient ID: Heather [name], female    DOB: Dec 10, 1932, 80 y.o.   MRN: [id number]  HPI 07/01/14 Patient has a history of atrial fibrillation. She is currently on Coumadin 3 mg on Monday and Friday and 2 mg all other days. Her INR has been stable the last several office visits. However today it is super therapeutic at 3.7.  She denies any recent antibiotics or medicine changes. She denies and dietary changes. Her weight is stable at 130 pounds. She denies any bleeding or bruising.  At that time, my plan was: 1. Atrial fibrillation Patient has been fairly stable. Therefore I will temporarily hold Coumadin today. I will then have her resume her previous chronic dose. I would recheck INR in 2 weeks. If INR remains elevated at that time I would decrease her dose permanently to 2 mg poqday. - PT with INR/Fingerstick  07/14/14 Patient is here today to recheck her INR.  She has no complaints. Past Medical History  Diagnosis Date  . Hyperlipidemia   . Chronic LBP   . Gout 01/2012    right index and middle fingers  . Dysrhythmia     atrial fib  . Dementia   . Scoliosis   . Hypertension     under control  . Full dentures   . Wears glasses   . Pacemaker     dr croitoru-[facility]  . Arthritis   . GERD (gastroesophageal reflux disease)   . Atrial fibrillation   . Hiatal hernia   . Seizures 2009    seizure x 1; none since pacemaker insertion   Current Outpatient Prescriptions on File Prior to Visit  Medication Sig Dispense Refill  . alum hydroxide-mag trisilicate (GAVISCON) 80-20 MG CHEW chewable tablet Chew 2 tablets by mouth as needed for indigestion or heartburn.      . colchicine 0.6 MG tablet Take 0.6 mg by mouth daily.      . cyclobenzaprine (FLEXERIL) 10 MG tablet Take 1 tablet (10 mg total) by mouth every 8 (eight) hours.  90 tablet  1  . diltiazem (CARDIZEM CD) 240 MG 24 hr capsule Take 1 capsule (240 mg total) by mouth daily.  90 capsule  2  . esomeprazole (NEXIUM) 40 MG  capsule TAKE 1 CAPSULE BY MOUTH DAILY BEFORE BREAKFAST.  90 capsule  3  . fish oil-omega-3 fatty acids 1000 MG capsule Take 2 g by mouth daily.      . furosemide (LASIX) 40 MG tablet Take 40 mg by mouth daily as needed for fluid.      . furosemide (LASIX) 40 MG tablet TAKE 1 TABLET BY MOUTH DAILY AS NEEDED FOR LEG SWELLING  30 tablet  3  . losartan (COZAAR) 100 MG tablet TAKE 1 TABLET DAILY  90 tablet  1  . memantine (NAMENDA) 10 MG tablet Take 10 mg by mouth 2 (two) times daily.      . potassium chloride SA (K-DUR,KLOR-CON) 20 MEQ tablet Take 20 mEq by mouth as needed.      . sotalol (BETAPACE) 80 MG tablet Take 80 mg by mouth 2 (two) times daily.      . sotalol (BETAPACE) 80 MG tablet TAKE 1 TABLET BY MOUTH TWICE A DAY  180 tablet  3  . traMADol (ULTRAM) 50 MG tablet Take 1 tablet (50 mg total) by mouth 2 (two) times daily as needed.  45 tablet  0  . warfarin (COUMADIN) 1 MG tablet 3 mg on Mondays and Thursdays  and 2 mg on all other days.       No current facility-administered medications on file prior to visit.   Allergies  Allergen Reactions  . Oxycodone Hcl Other (See Comments)    HALLUCINATIONS  . Amiodarone     Loss of appetite  . Benazepril Cough  . Prednisone Other (See Comments)    Hallucinations  . Aspirin Other (See Comments)    GI UPSET  . Codeine Other (See Comments)    GI UPSET  . Sulfa Antibiotics Other (See Comments)    GI UPSET   History   Social History  . Marital Status: Married    Spouse Name: N/A    Number of Children: N/A  . Years of Education: N/A   Occupational History  . Not on file.   Social History Main Topics  . Smoking status: Never Smoker   . Smokeless tobacco: Never Used  . Alcohol Use: No  . Drug Use: No  . Sexual Activity: Not on file   Other Topics Concern  . Not on file   Social History Narrative  . No narrative on file      Review of Systems  All other systems reviewed and are negative.      Objective:   Physical Exam   Vitals reviewed. Cardiovascular: Normal rate.  An irregularly irregular rhythm present.  Pulmonary/Chest: Effort normal and breath sounds normal. No respiratory distress. She has no wheezes. She has no rales.  Abdominal: Soft. Bowel sounds are normal.  Musculoskeletal: She exhibits no edema.          Assessment & Plan:  1. Atrial fibrillation INR is therapeutic at 2.9.  Continue current dose of Coumadin. Only the changes the present time. Recheck in one month. - PT with INR/Fingerstick

## 2014-07-28 ENCOUNTER — Other Ambulatory Visit: Payer: Self-pay | Admitting: Family Medicine

## 2014-07-28 NOTE — Telephone Encounter (Signed)
Ok to refill??  Last office visit 07/14/2014.  Last refill 04/16/2014, #1 refill.

## 2014-07-29 NOTE — Telephone Encounter (Signed)
ok 

## 2014-08-15 ENCOUNTER — Encounter: Payer: Self-pay | Admitting: Family Medicine

## 2014-08-15 ENCOUNTER — Ambulatory Visit (INDEPENDENT_AMBULATORY_CARE_PROVIDER_SITE_OTHER): Payer: Medicare Other | Admitting: Family Medicine

## 2014-08-15 VITALS — BP 130/68 | HR 72 | Temp 97.7°F | Resp 16 | Ht 61.0 in | Wt 127.0 lb

## 2014-08-15 DIAGNOSIS — I4891 Unspecified atrial fibrillation: Secondary | ICD-10-CM

## 2014-08-15 LAB — PT WITH INR/FINGERSTICK
INR, fingerstick: 3 — ABNORMAL HIGH (ref 0.80–1.20)
PT FINGERSTICK: 35.5 s — AB (ref 10.4–12.5)

## 2014-08-15 NOTE — Progress Notes (Signed)
Subjective:    Patient ID: Heather [name], female    DOB: Apr 30, 1933, 80 y.o.   MRN: [id number]  HPI Patient has atrial fibrillation. She is currently taking Coumadin 3 mg on Monday and Friday. She takes 2 mg every other day. Her INR today is in the upper limits of normal at 3.0. She denies any bleeding or bruising. She denies any recent medication changes or changes in her diet. Past Medical History  Diagnosis Date  . Hyperlipidemia   . Chronic LBP   . Gout 01/2012    right index and middle fingers  . Dysrhythmia     atrial fib  . Dementia   . Scoliosis   . Hypertension     under control  . Full dentures   . Wears glasses   . Pacemaker     dr croitoru-[facility]  . Arthritis   . GERD (gastroesophageal reflux disease)   . Atrial fibrillation   . Hiatal hernia   . Seizures 2009    seizure x 1; none since pacemaker insertion   Past Surgical History  Procedure Laterality Date  . Tonsillectomy    . Hernia repair    . Trigger finger release  04/24/2011    release A-1 pulley left index  . Carpal tunnel release  11/14/2009    right  . Appendectomy    . Abdominal hysterectomy    . Heel spur surgery      both feet  . Eye surgery      both cataracts  . Permanent pacemaker insertion  07/08/2008    Medtronic  . Shoulder arthroscopy  04/15/2006 - left    right:  10/18/2005, 09/24/2005  . Lumbar laminectomy/decompression microdiscectomy  02/06/2005; 10/21/2003    with fusions   . Back surgery  4098,1191  . Cardioversion  08/08/2010    for atrial fib.  . Trigger finger release  08/10/2003    right thumb  . Wrist arthroscopy  07/04/2010    left wrist; also left CTR  . Spinal growth rods    . Knee arthroscopy      right  . Trigger finger release  01/21/2012    Procedure: RELEASE TRIGGER FINGER/A-1 PULLEY;  Surgeon: Nicki Reaper, MD;  Location: [facility];  Service: Orthopedics;  Laterality: Right;  right ring finger  . I&d extremity  02/05/2012    Procedure: IRRIGATION AND  DEBRIDEMENT EXTREMITY;  Surgeon: Nicki Reaper, MD;  Location: [facility];  Service: Orthopedics;  Laterality: Right;  debridement right index and middle fingers  . Trigger finger release Left 03/17/2013    Procedure: RELEASE TRIGGER FINGER/A-1 PULLEY LEFT MIDDLE, RING AND SMALL FINGERS;  Surgeon: Nicki Reaper, MD;  Location: [facility];  Service: Orthopedics;  Laterality: Left;  . Nm myocar perf wall motion  11/24/2007    No ischemia   Current Outpatient Prescriptions on File Prior to Visit  Medication Sig Dispense Refill  . alum hydroxide-mag trisilicate (GAVISCON) 80-20 MG CHEW chewable tablet Chew 2 tablets by mouth as needed for indigestion or heartburn.      . colchicine 0.6 MG tablet Take 0.6 mg by mouth daily.      . cyclobenzaprine (FLEXERIL) 10 MG tablet TAKE 1 TABELT BY MOUTH EVERY 8 HOURS  90 tablet  1  . diltiazem (CARDIZEM CD) 240 MG 24 hr capsule Take 1 capsule (240 mg total) by mouth daily.  90 capsule  2  . esomeprazole (NEXIUM) 40 MG capsule  TAKE 1 CAPSULE BY MOUTH DAILY BEFORE BREAKFAST.  90 capsule  3  . fish oil-omega-3 fatty acids 1000 MG capsule Take 2 g by mouth daily.      . furosemide (LASIX) 40 MG tablet Take 40 mg by mouth daily as needed for fluid.      . furosemide (LASIX) 40 MG tablet TAKE 1 TABLET BY MOUTH DAILY AS NEEDED FOR LEG SWELLING  30 tablet  3  . losartan (COZAAR) 100 MG tablet TAKE 1 TABLET DAILY  90 tablet  1  . memantine (NAMENDA) 10 MG tablet Take 10 mg by mouth 2 (two) times daily.      . potassium chloride SA (K-DUR,KLOR-CON) 20 MEQ tablet Take 20 mEq by mouth as needed.      . sotalol (BETAPACE) 80 MG tablet TAKE 1 TABLET BY MOUTH TWICE A DAY  180 tablet  3  . traMADol (ULTRAM) 50 MG tablet Take 1 tablet (50 mg total) by mouth 2 (two) times daily as needed.  45 tablet  0  . warfarin (COUMADIN) 1 MG tablet 3 mg on Mondays and Thursdays and 2 mg on all other days.       No current facility-administered medications on  file prior to visit.   Allergies  Allergen Reactions  . Oxycodone Hcl Other (See Comments)    HALLUCINATIONS  . Amiodarone     Loss of appetite  . Benazepril Cough  . Prednisone Other (See Comments)    Hallucinations  . Aspirin Other (See Comments)    GI UPSET  . Codeine Other (See Comments)    GI UPSET  . Sulfa Antibiotics Other (See Comments)    GI UPSET   History   Social History  . Marital Status: Married    Spouse Name: N/A    Number of Children: N/A  . Years of Education: N/A   Occupational History  . Not on file.   Social History Main Topics  . Smoking status: Never Smoker   . Smokeless tobacco: Never Used  . Alcohol Use: No  . Drug Use: No  . Sexual Activity: Not on file   Other Topics Concern  . Not on file   Social History Narrative  . No narrative on file      Review of Systems  All other systems reviewed and are negative.      Objective:   Physical Exam  Vitals reviewed. Constitutional: She appears well-developed and well-nourished.  Cardiovascular: Normal rate and normal heart sounds.   Pulmonary/Chest: Effort normal and breath sounds normal. No respiratory distress. She has no wheezes. She has no rales.  Musculoskeletal: She exhibits no edema.          Assessment & Plan:  Atrial fibrillation - Plan: PT with INR/Fingerstick Continue 3 mg Monday and Friday and 2 mg every other day. Recheck INR in 4-6 weeks.

## 2014-08-30 ENCOUNTER — Ambulatory Visit (INDEPENDENT_AMBULATORY_CARE_PROVIDER_SITE_OTHER): Payer: Medicare Other | Admitting: Cardiovascular Disease

## 2014-08-30 ENCOUNTER — Encounter: Payer: Self-pay | Admitting: Cardiovascular Disease

## 2014-08-30 VITALS — BP 146/62 | HR 64 | Resp 16 | Ht 61.0 in | Wt 128.4 lb

## 2014-08-30 DIAGNOSIS — Z950 Presence of cardiac pacemaker: Secondary | ICD-10-CM

## 2014-08-30 DIAGNOSIS — F0390 Unspecified dementia without behavioral disturbance: Secondary | ICD-10-CM

## 2014-08-30 DIAGNOSIS — I4891 Unspecified atrial fibrillation: Secondary | ICD-10-CM

## 2014-08-30 DIAGNOSIS — R011 Cardiac murmur, unspecified: Secondary | ICD-10-CM

## 2014-08-30 LAB — MDC_IDC_ENUM_SESS_TYPE_INCLINIC
Battery Voltage: 2.95 V
Brady Statistic AP VP Percent: 2.42 %
Brady Statistic AP VS Percent: 9.45 %
Brady Statistic AS VP Percent: 47.57 %
Brady Statistic AS VS Percent: 40.56 %
Brady Statistic RV Percent Paced: 49.99 %
Date Time Interrogation Session: 20150929105645
Lead Channel Impedance Value: 424 Ohm
Lead Channel Impedance Value: 480 Ohm
Lead Channel Pacing Threshold Amplitude: 1 V
Lead Channel Pacing Threshold Pulse Width: 0.4 ms
Lead Channel Sensing Intrinsic Amplitude: 0.1331
Lead Channel Setting Pacing Amplitude: 2 V
Lead Channel Setting Pacing Amplitude: 2.5 V
Lead Channel Setting Pacing Pulse Width: 0.4 ms
MDC IDC MSMT LEADCHNL RV SENSING INTR AMPL: 6.8224
MDC IDC SET LEADCHNL RV SENSING SENSITIVITY: 0.9 mV
MDC IDC SET ZONE DETECTION INTERVAL: 350 ms
MDC IDC SET ZONE DETECTION INTERVAL: 400 ms
MDC IDC STAT BRADY RA PERCENT PACED: 11.87 %

## 2014-08-30 NOTE — Patient Instructions (Signed)
Remote monitoring is used to monitor your pacemaker from home. This monitoring reduces the number of office visits required to check your device to one time per year. It allows us to keep an eye on the functioning of your device to ensure it is working properly. You are scheduled for a device check from home on 12-01-2014. You may send your transmission at any time that day. If you have a wireless device, the transmission will be sent automatically. After your physician reviews your transmission, you will receive a postcard with your next transmission date.  Your physician recommends that you schedule a follow-up appointment in: 6 months with Dr.Croitoru  

## 2014-09-01 ENCOUNTER — Encounter: Payer: Self-pay | Admitting: Cardiovascular Disease

## 2014-09-01 NOTE — Progress Notes (Signed)
Patient ID: Heather [name], female   DOB: [birth year], 80 y.o.   MRN: [id number]     Reason for office visit Paroxysmal atrial fibrillation, pacemaker check   Heather [name] is a 80 year old woman with mild dementia who is here for followup on her dual chamber permanent pacemaker (Medtronic Enrhythm) which was implanted for sinus node dysfunction and tachycardia bradycardia syndrome in 2009. She is now essentially pacemaker dependent due to severe sinus bradycardia/sinus arrest. The burden of atrial fibrillation is always fairly high but she appears to be oblivious to the arrhythmia. Recently she has had 90% burden of atrial fibrillation over the last 6 months. She takes chronic sotalol therapy. She takes chronic warfarin therapy and has not had bleeding complications or focal neurological events. Her only complaint today is stomach upset after she eats. Mr. Miska tells me that the gastroenterologist has told her that she has a "rotten stomach". The discomfort is generally relieved by 2 Gaviscon tablets.  Pacemaker function is normal. As mentioned there is about 90% atrial fibrillation. The other 10% of the time she has atrial pacing. There is roughly 50% ventricular pacing.  Allergies  Allergen Reactions  . Oxycodone Hcl Other (See Comments)    HALLUCINATIONS  . Amiodarone     Loss of appetite  . Benazepril Cough  . Prednisone Other (See Comments)    Hallucinations  . Aspirin Other (See Comments)    GI UPSET  . Codeine Other (See Comments)    GI UPSET  . Sulfa Antibiotics Other (See Comments)    GI UPSET    Current Outpatient Prescriptions  Medication Sig Dispense Refill  . alum hydroxide-mag trisilicate (GAVISCON) 80-20 MG CHEW chewable tablet Chew 2 tablets by mouth as needed for indigestion or heartburn.      . colchicine 0.6 MG tablet Take 0.6 mg by mouth daily.      . cyclobenzaprine (FLEXERIL) 10 MG tablet TAKE 1 TABELT BY MOUTH EVERY 8 HOURS  90 tablet  1  . diltiazem (CARDIZEM  CD) 240 MG 24 hr capsule Take 1 capsule (240 mg total) by mouth daily.  90 capsule  2  . esomeprazole (NEXIUM) 40 MG capsule TAKE 1 CAPSULE BY MOUTH DAILY BEFORE BREAKFAST.  90 capsule  3  . fish oil-omega-3 fatty acids 1000 MG capsule Take 2 g by mouth daily.      . furosemide (LASIX) 40 MG tablet Take 40 mg by mouth daily as needed for fluid.      . furosemide (LASIX) 40 MG tablet TAKE 1 TABLET BY MOUTH DAILY AS NEEDED FOR LEG SWELLING  30 tablet  3  . losartan (COZAAR) 100 MG tablet TAKE 1 TABLET DAILY  90 tablet  1  . memantine (NAMENDA) 10 MG tablet Take 10 mg by mouth 2 (two) times daily.      . potassium chloride SA (K-DUR,KLOR-CON) 20 MEQ tablet Take 20 mEq by mouth as needed.      . sotalol (BETAPACE) 80 MG tablet TAKE 1 TABLET BY MOUTH TWICE A DAY  180 tablet  3  . traMADol (ULTRAM) 50 MG tablet Take 1 tablet (50 mg total) by mouth 2 (two) times daily as needed.  45 tablet  0  . warfarin (COUMADIN) 1 MG tablet 3 mg on Mondays and Thursdays and 2 mg on all other days.       No current facility-administered medications for this visit.    Past Medical History  Diagnosis Date  . Hyperlipidemia   . Chronic  LBP   . Gout 01/2012    right index and middle fingers  . Dysrhythmia     atrial fib  . Dementia   . Scoliosis   . Hypertension     under control  . Full dentures   . Wears glasses   . Pacemaker     dr Kailly Richoux[facility]  . Arthritis   . GERD (gastroesophageal reflux disease)   . Atrial fibrillation   . Hiatal hernia   . Seizures 2009    seizure x 1; none since pacemaker insertion    Past Surgical History  Procedure Laterality Date  . Tonsillectomy    . Hernia repair    . Trigger finger release  04/24/2011    release A-1 pulley left index  . Carpal tunnel release  11/14/2009    right  . Appendectomy    . Abdominal hysterectomy    . Heel spur surgery      both feet  . Eye surgery      both cataracts  . Permanent pacemaker insertion  07/08/2008    Medtronic  .  Shoulder arthroscopy  04/15/2006 - left    right:  10/18/2005, 09/24/2005  . Lumbar laminectomy/decompression microdiscectomy  02/06/2005; 10/21/2003    with fusions   . Back surgery  1610,9604  . Cardioversion  08/08/2010    for atrial fib.  . Trigger finger release  08/10/2003    right thumb  . Wrist arthroscopy  07/04/2010    left wrist; also left CTR  . Spinal growth rods    . Knee arthroscopy      right  . Trigger finger release  01/21/2012    Procedure: RELEASE TRIGGER FINGER/A-1 PULLEY;  Surgeon: Nicki Reaper, MD;  Location: [facility];  Service: Orthopedics;  Laterality: Right;  right ring finger  . I&d extremity  02/05/2012    Procedure: IRRIGATION AND DEBRIDEMENT EXTREMITY;  Surgeon: Nicki Reaper, MD;  Location: [facility];  Service: Orthopedics;  Laterality: Right;  debridement right index and middle fingers  . Trigger finger release Left 03/17/2013    Procedure: RELEASE TRIGGER FINGER/A-1 PULLEY LEFT MIDDLE, RING AND SMALL FINGERS;  Surgeon: Nicki Reaper, MD;  [facility];  Service: Orthopedics;  Laterality: Left;  . Nm myocar perf wall motion  11/24/2007    No ischemia    Family History  Problem Relation Age of Onset  . Heart failure Mother   . Alzheimer's disease Father   . Stroke Sister     History   Social History  . Marital Status: Married    Spouse Name: N/A    Number of Children: N/A  . Years of Education: N/A   Occupational History  . Not on file.   Social History Main Topics  . Smoking status: Never Smoker   . Smokeless tobacco: Never Used  . Alcohol Use: No  . Drug Use: No  . Sexual Activity: Not on file   Other Topics Concern  . Not on file   Social History Narrative  . No narrative on file    Review of systems: The patient specifically denies any chest pain at rest or with exertion, dyspnea at rest or with exertion, orthopnea, paroxysmal nocturnal dyspnea, syncope, palpitations, focal  neurological deficits, intermittent claudication, lower extremity edema, unexplained weight gain, cough, hemoptysis or wheezing.  The patient has stomach pain after eating but denies  nausea, vomiting, dysphagia, diarrhea, constipation, polyuria, polydipsia, dysuria, hematuria, frequency, urgency, abnormal  bleeding or bruising, fever, chills, unexpected weight changes, mood swings, change in skin or hair texture, change in voice quality, auditory or visual problems, allergic reactions or rashes, new musculoskeletal complaints other than usual "aches and pains".   PHYSICAL EXAM BP 146/62  Pulse 64  Resp 16  Ht 5\' 1"  (1.549 m)  Wt 58.242 kg (128 lb 6.4 oz)  BMI 24.27 kg/m2 General: Alert, oriented x3, no distress  Head: no evidence of trauma, PERRL, EOMI, no exophtalmos or lid lag, no myxedema, no xanthelasma; normal ears, nose and oropharynx  Neck: normal jugular venous pulsations and no hepatojugular reflux; brisk carotid pulses without delay and no carotid bruits  Chest: clear to auscultation, no signs of consolidation by percussion or palpation, normal fremitus, symmetrical and full respiratory excursions , healthy pacemaker site Cardiovascular: normal position and quality of the apical impulse, irregular rhythm, normal first and second heart sounds, no rubs or gallops, holosystolic murmur at the left lower sternal border  Abdomen: no tenderness or distention, no masses by palpation, no abnormal pulsatility or arterial bruits, normal bowel sounds, no hepatosplenomegaly  Extremities: no clubbing, cyanosis or edema - I see little difference between her 2 ankles; 2+ radial, ulnar and brachial pulses bilaterally; 2+ right femoral, posterior tibial and dorsalis pedis pulses; 2+ left femoral, posterior tibial and dorsalis pedis pulses; no subclavian or femoral bruits  Neurological: grossly nonfocal    EKG: Atrial fibrillation with mostly ventricular paced rhythm occasional ventricular sensed  beats  Lipid Panel     Component Value Date/Time   CHOL  Value: 178        ATP III CLASSIFICATION:  <200     mg/dL   Desirable  161-096  mg/dL   Borderline High  >=045    mg/dL   High        4/0/9811 0214   TRIG 76 08/05/2010 0214   HDL 51 08/05/2010 0214   CHOLHDL 3.5 08/05/2010 0214   VLDL 15 08/05/2010 0214   LDLCALC  Value: 112        Total Cholesterol/HDL:CHD Risk Coronary Heart Disease Risk Table                     Men   Women  1/2 Average Risk   3.4   3.3  Average Risk       5.0   4.4  2 X Average Risk   9.6   7.1  3 X Average Risk  23.4   11.0        Use the calculated Patient Ratio above and the CHD Risk Table to determine the patient's CHD Risk.        ATP III CLASSIFICATION (LDL):  <100     mg/dL   Optimal  914-782  mg/dL   Near or Above                    Optimal  130-159  mg/dL   Borderline  956-213  mg/dL   High  >086     mg/dL   Very High* 04/07/8468 6295    BMET    Component Value Date/Time   NA 140 02/07/2014 1036   K 4.4 02/07/2014 1036   CL 107 02/07/2014 1036   CO2 26 02/07/2014 1036   GLUCOSE 82 02/07/2014 1036   BUN 28* 02/07/2014 1036   CREATININE 1.20* 02/07/2014 1036   CREATININE 0.80 09/16/2013 0526   CALCIUM 9.1 02/07/2014 1036   GFRNONAA 31* 10/21/2013 1414  GFRNONAA 69* 09/16/2013 0526   GFRAA 35* 10/21/2013 1414   GFRAA 80* 09/16/2013 0526     ASSESSMENT AND PLAN Atrial fibrillation, persistent Her arrhythmia may be settling in a pattern of new permanent atrial fibrillation. If this remains the case at her next appointment I would probably recommend discontinuing her sotalol since the risks seem to outweigh the benefits. The arrhythmia does not appear to be symptomatic  Pacemaker dual chamber implanted August 2009 for sinus node dysfunction Medtronic Enrhythm  Normal device function. When not in atrial fibrillation, she paces the atrium 100%. Ventricular pacing occurs roughly 50% of the time. Continue [facility] monitoring   Heart murmur  She has aortic valve sclerosis  without stenosis, mild aortic insufficiency and moderate tricuspid insufficiency by previous echo     Orders Placed This Encounter  Procedures  . Implantable device check  . EKG 12-Lead   No orders of the defined types were placed in this encounter.    Junious Silk, MD, [facility] [phone] office [phone] pager

## 2014-09-07 ENCOUNTER — Encounter: Payer: Self-pay | Admitting: Cardiology

## 2014-09-15 ENCOUNTER — Encounter: Payer: Self-pay | Admitting: Family Medicine

## 2014-09-15 ENCOUNTER — Ambulatory Visit (INDEPENDENT_AMBULATORY_CARE_PROVIDER_SITE_OTHER): Payer: Medicare Other | Admitting: Family Medicine

## 2014-09-15 VITALS — BP 122/74 | HR 78 | Temp 98.0°F | Resp 14 | Ht 61.0 in | Wt 127.0 lb

## 2014-09-15 DIAGNOSIS — Z23 Encounter for immunization: Secondary | ICD-10-CM

## 2014-09-15 DIAGNOSIS — I4891 Unspecified atrial fibrillation: Secondary | ICD-10-CM

## 2014-09-15 LAB — PT WITH INR/FINGERSTICK
INR FINGERSTICK: 3.1 — AB (ref 0.80–1.20)
PT FINGERSTICK: 37.3 s — AB (ref 10.4–12.5)

## 2014-09-15 NOTE — Progress Notes (Signed)
Subjective:    Patient ID: Heather [name], female    DOB: 04/16/1933, 80 y.o.   MRN: [id number]  HPI Patient is in persistent atrial fibrillation. She is here today for INR check. Her weight has steadily fallen to 127. Due to her dementia, her appetite is decreasing. Today her INR is slightly elevated at 3.1. Ferritin no changes in her medications. I reviewed her last office visit with her cardiologist. At her next office visit, they may discontinue sotalol due to the persistent atrial fibrillation and bradycardia. Past Medical History  Diagnosis Date  . Hyperlipidemia   . Chronic LBP   . Gout 01/2012    right index and middle fingers  . Dysrhythmia     atrial fib  . Dementia   . Scoliosis   . Hypertension     under control  . Full dentures   . Wears glasses   . Pacemaker     dr croitoru-[facility]  . Arthritis   . GERD (gastroesophageal reflux disease)   . Atrial fibrillation   . Hiatal hernia   . Seizures 2009    seizure x 1; none since pacemaker insertion   Past Surgical History  Procedure Laterality Date  . Tonsillectomy    . Hernia repair    . Trigger finger release  04/24/2011    release A-1 pulley left index  . Carpal tunnel release  11/14/2009    right  . Appendectomy    . Abdominal hysterectomy    . Heel spur surgery      both feet  . Eye surgery      both cataracts  . Permanent pacemaker insertion  07/08/2008    Medtronic  . Shoulder arthroscopy  04/15/2006 - left    right:  10/18/2005, 09/24/2005  . Lumbar laminectomy/decompression microdiscectomy  02/06/2005; 10/21/2003    with fusions   . Back surgery  [id number],2005  . Cardioversion  08/08/2010    for atrial fib.  . Trigger finger release  08/10/2003    right thumb  . Wrist arthroscopy  07/04/2010    left wrist; also left CTR  . Spinal growth rods    . Knee arthroscopy      right  . Trigger finger release  01/21/2012    Procedure: RELEASE TRIGGER FINGER/A-1 PULLEY;  Surgeon: Nicki ReaperGary R Kuzma, MD;  Location: [facility];  Service: Orthopedics;  Laterality: Right;  right ring finger  . I&d extremity  02/05/2012    Procedure: IRRIGATION AND DEBRIDEMENT EXTREMITY;  Surgeon: Nicki ReaperGary R Kuzma, MD;  Location: [facility];  Service: Orthopedics;  Laterality: Right;  debridement right index and middle fingers  . Trigger finger release Left 03/17/2013    Procedure: RELEASE TRIGGER FINGER/A-1 PULLEY LEFT MIDDLE, RING AND SMALL FINGERS;  Surgeon: Nicki ReaperGary R Kuzma, MD;  Location: [facility];  Service: Orthopedics;  Laterality: Left;  . Nm myocar perf wall motion  11/24/2007    No ischemia   Current Outpatient Prescriptions on File Prior to Visit  Medication Sig Dispense Refill  . alum hydroxide-mag trisilicate (GAVISCON) 80-20 MG CHEW chewable tablet Chew 2 tablets by mouth as needed for indigestion or heartburn.      . colchicine 0.6 MG tablet Take 0.6 mg by mouth daily.      . cyclobenzaprine (FLEXERIL) 10 MG tablet TAKE 1 TABELT BY MOUTH EVERY 8 HOURS  90 tablet  1  . diltiazem (CARDIZEM CD) 240 MG 24 hr capsule Take 1 capsule (240  mg total) by mouth daily.  90 capsule  2  . esomeprazole (NEXIUM) 40 MG capsule TAKE 1 CAPSULE BY MOUTH DAILY BEFORE BREAKFAST.  90 capsule  3  . fish oil-omega-3 fatty acids 1000 MG capsule Take 2 g by mouth daily.      . furosemide (LASIX) 40 MG tablet Take 40 mg by mouth daily as needed for fluid.      . furosemide (LASIX) 40 MG tablet TAKE 1 TABLET BY MOUTH DAILY AS NEEDED FOR LEG SWELLING  30 tablet  3  . losartan (COZAAR) 100 MG tablet TAKE 1 TABLET DAILY  90 tablet  1  . memantine (NAMENDA) 10 MG tablet Take 10 mg by mouth 2 (two) times daily.      . potassium chloride SA (K-DUR,KLOR-CON) 20 MEQ tablet Take 20 mEq by mouth as needed.      . sotalol (BETAPACE) 80 MG tablet TAKE 1 TABLET BY MOUTH TWICE A DAY  180 tablet  3  . traMADol (ULTRAM) 50 MG tablet Take 1 tablet (50 mg total) by mouth 2 (two) times daily as needed.  45 tablet  0  . warfarin  (COUMADIN) 1 MG tablet 3 mg on Mondays and Thursdays and 2 mg on all other days.       No current facility-administered medications on file prior to visit.   Allergies  Allergen Reactions  . Oxycodone Hcl Other (See Comments)    HALLUCINATIONS  . Amiodarone     Loss of appetite  . Benazepril Cough  . Prednisone Other (See Comments)    Hallucinations  . Aspirin Other (See Comments)    GI UPSET  . Codeine Other (See Comments)    GI UPSET  . Sulfa Antibiotics Other (See Comments)    GI UPSET   History   Social History  . Marital Status: Married    Spouse Name: N/A    Number of Children: N/A  . Years of Education: N/A   Occupational History  . Not on file.   Social History Main Topics  . Smoking status: Never Smoker   . Smokeless tobacco: Never Used  . Alcohol Use: No  . Drug Use: No  . Sexual Activity: Not on file   Other Topics Concern  . Not on file   Social History Narrative  . No narrative on file      Review of Systems  All other systems reviewed and are negative.      Objective:   Physical Exam  Vitals reviewed. Cardiovascular: Normal rate.  An irregularly irregular rhythm present.  No murmur heard. Pulmonary/Chest: Effort normal and breath sounds normal. No respiratory distress. She has no wheezes. She has no rales.  Musculoskeletal: She exhibits no edema.          Assessment & Plan:  Atrial fibrillation - Plan: PT with INR/Fingerstick  Patient's INR is slightly super therapeutic. I have asked her to skip today's dose of her Coumadin and then resume her previous dose and recheck in 1 month. She is currently taking 3 mg on Monday and Friday and 2 mg on every other day.  If she continues to remain supratherapeutic it may be due to her declining weight and we will need to permanently reduce her dose of Coumadin.

## 2014-09-15 NOTE — Addendum Note (Signed)
Addended by: Legrand RamsWILLIS, Trace Cederberg B on: 09/15/2014 10:24 AM   Modules accepted: Orders

## 2014-09-28 ENCOUNTER — Other Ambulatory Visit: Payer: Self-pay | Admitting: Cardiovascular Disease

## 2014-09-28 NOTE — Telephone Encounter (Signed)
Rx was sent to pharmacy electronically. 

## 2014-10-24 ENCOUNTER — Other Ambulatory Visit: Payer: Self-pay | Admitting: Family Medicine

## 2014-10-24 ENCOUNTER — Other Ambulatory Visit: Payer: Self-pay | Admitting: Cardiovascular Disease

## 2014-10-24 ENCOUNTER — Ambulatory Visit (INDEPENDENT_AMBULATORY_CARE_PROVIDER_SITE_OTHER): Payer: Medicare Other | Admitting: Family Medicine

## 2014-10-24 ENCOUNTER — Encounter: Payer: Self-pay | Admitting: Family Medicine

## 2014-10-24 DIAGNOSIS — I4891 Unspecified atrial fibrillation: Secondary | ICD-10-CM

## 2014-10-24 LAB — PT WITH INR/FINGERSTICK
INR FINGERSTICK: 3.2 — AB (ref 0.80–1.20)
PT FINGERSTICK: 38.2 s — AB (ref 10.4–12.5)

## 2014-10-24 NOTE — Progress Notes (Signed)
Subjective:    Patient ID: Heather [name], female    DOB: 06/16/1933, 80 y.o.   MRN: [id number]  HPI Patient's health continues to slowly decline do to her dementia. She experiences sundowning every afternoon and evening. She typically does well in the morning. Her weight continues to fall. She will 127 last visit today she is 122. As her weight declines her INR continues to remain elevated. Today it is 3.2. Her husband states that she is eating fair. She has a difficult time maneuvering around the house due to balance issues. She ambulates with a cane with a walker. Past Medical History  Diagnosis Date  . Hyperlipidemia   . Chronic LBP   . Gout 01/2012    right index and middle fingers  . Dysrhythmia     atrial fib  . Dementia   . Scoliosis   . Hypertension     under control  . Full dentures   . Wears glasses   . Pacemaker     dr croitoru-[facility]  . Arthritis   . GERD (gastroesophageal reflux disease)   . Atrial fibrillation   . Hiatal hernia   . Seizures 2009    seizure x 1; none since pacemaker insertion   Past Surgical History  Procedure Laterality Date  . Tonsillectomy    . Hernia repair    . Trigger finger release  04/24/2011    release A-1 pulley left index  . Carpal tunnel release  11/14/2009    right  . Appendectomy    . Abdominal hysterectomy    . Heel spur surgery      both feet  . Eye surgery      both cataracts  . Permanent pacemaker insertion  07/08/2008    [employer]tr[employer]  . Shoulder arthroscopy  04/15/2006 - left    right:  10/18/2005, 09/24/2005  . Lumbar laminectomy/decompression microdiscectomy  02/06/2005; 10/21/2003    with fusions   . Back surgery  [id number],2005  . Cardioversion  08/08/2010    for atrial fib.  . Trigger finger release  08/10/2003    right thumb  . Wrist arthroscopy  07/04/2010    left wrist; also left CTR  . Spinal growth rods    . Knee arthroscopy      right  . Trigger finger release  01/21/2012    Procedure: RELEASE TRIGGER FINGER/A-1 PULLEY;   Surgeon: Nicki ReaperGary R Kuzma, MD;  Location: [facility];  Service: Orthopedics;  Laterality: Right;  right ring finger  . I&d extremity  02/05/2012    Procedure: IRRIGATION AND DEBRIDEMENT EXTREMITY;  Surgeon: Nicki ReaperGary R Kuzma, MD;  Location: [facility];  Service: Orthopedics;  Laterality: Right;  debridement right index and middle fingers  . Trigger finger release Left 03/17/2013    Procedure: RELEASE TRIGGER FINGER/A-1 PULLEY LEFT MIDDLE, RING AND SMALL FINGERS;  Surgeon: Nicki ReaperGary R Kuzma, MD;  Location: [facility];  Service: Orthopedics;  Laterality: Left;  . Nm myocar perf wall motion  11/24/2007    No ischemia   Current Outpatient Prescriptions on File Prior to Visit  Medication Sig Dispense Refill  . alum hydroxide-mag trisilicate (GAVISCON) 80-20 MG CHEW chewable tablet Chew 2 tablets by mouth as needed for indigestion or heartburn.    . colchicine 0.6 MG tablet Take 0.6 mg by mouth daily.    . cyclobenzaprine (FLEXERIL) 10 MG tablet TAKE 1 TABELT BY MOUTH EVERY 8 HOURS 90 tablet 1  . diltiazem (CARDIZEM CD) 240 MG  24 hr capsule Take 1 capsule (240 mg total) by mouth daily. 90 capsule 2  . diltiazem (CARDIZEM CD) 240 MG 24 hr capsule TAKE 1 CAPSULE BY MOUTH EVERY EVENING 30 capsule 5  . esomeprazole (NEXIUM) 40 MG capsule TAKE 1 CAPSULE BY MOUTH DAILY BEFORE BREAKFAST. 90 capsule 3  . fish oil-omega-3 fatty acids 1000 MG capsule Take 2 g by mouth daily.    . furosemide (LASIX) 40 MG tablet Take 40 mg by mouth daily as needed for fluid.    . furosemide (LASIX) 40 MG tablet TAKE 1 TABLET BY MOUTH DAILY AS NEEDED FOR LEG SWELLING 30 tablet 3  . losartan (COZAAR) 100 MG tablet TAKE 1 TABLET DAILY 90 tablet 1  . memantine (NAMENDA) 10 MG tablet Take 10 mg by mouth 2 (two) times daily.    . potassium chloride SA (K-DUR,KLOR-CON) 20 MEQ tablet Take 20 mEq by mouth as needed.    . sotalol (BETAPACE) 80 MG tablet TAKE 1 TABLET BY MOUTH TWICE A DAY 180 tablet 3  .  traMADol (ULTRAM) 50 MG tablet Take 1 tablet (50 mg total) by mouth 2 (two) times daily as needed. 45 tablet 0  . warfarin (COUMADIN) 1 MG tablet 3 mg on Mondays and Thursdays and 2 mg on all other days.     No current facility-administered medications on file prior to visit.   Allergies  Allergen Reactions  . Oxycodone Hcl Other (See Comments)    HALLUCINATIONS  . Amiodarone     Loss of appetite  . Benazepril Cough  . Prednisone Other (See Comments)    Hallucinations  . Aspirin Other (See Comments)    GI UPSET  . Codeine Other (See Comments)    GI UPSET  . Sulfa Antibiotics Other (See Comments)    GI UPSET   History   Social History  . Marital Status: Married    Spouse Name: N/A    Number of Children: N/A  . Years of Education: N/A   Occupational History  . Not on file.   Social History Main Topics  . Smoking status: Never Smoker   . Smokeless tobacco: Never Used  . Alcohol Use: No  . Drug Use: No  . Sexual Activity: Not on file   Other Topics Concern  . Not on file   Social History Narrative      Review of Systems  All other systems reviewed and are negative.      Objective:   Physical Exam  Constitutional: She appears well-developed and well-nourished.  Cardiovascular: Normal rate and normal heart sounds.  An irregularly irregular rhythm present.  Pulmonary/Chest: Effort normal and breath sounds normal. No respiratory distress. She has no wheezes. She has no rales.  Abdominal: Soft. Bowel sounds are normal.  Musculoskeletal: She exhibits no edema.  Vitals reviewed.         Assessment & Plan:  Atrial fibrillation - Plan: PT with INR/Fingerstick  Patient requires a dosage adjustment in her Coumadin. We will decrease her Coumadin to 2 mg every day. Recheck INR in 1 month. Her weight continues to decline at that point I will restart Megace. I've also recommended supplementing her meals with Ensure.

## 2014-10-24 NOTE — Telephone Encounter (Signed)
Rx was sent to pharmacy electronically. 

## 2014-10-24 NOTE — Telephone Encounter (Signed)
?   OK to Refill  

## 2014-10-25 NOTE — Telephone Encounter (Signed)
Ok, but notify leo that this medicine can cause confusion and may increase her memory problems.  Can they try stopping it?

## 2014-11-02 ENCOUNTER — Other Ambulatory Visit: Payer: Self-pay | Admitting: Family Medicine

## 2014-11-19 ENCOUNTER — Other Ambulatory Visit: Payer: Self-pay | Admitting: Family Medicine

## 2014-11-28 ENCOUNTER — Ambulatory Visit (INDEPENDENT_AMBULATORY_CARE_PROVIDER_SITE_OTHER): Payer: Medicare Other | Admitting: Family Medicine

## 2014-11-28 ENCOUNTER — Encounter: Payer: Self-pay | Admitting: Family Medicine

## 2014-11-28 VITALS — BP 120/68 | HR 68 | Temp 98.9°F | Resp 16 | Ht 61.0 in | Wt 123.0 lb

## 2014-11-28 DIAGNOSIS — I4891 Unspecified atrial fibrillation: Secondary | ICD-10-CM

## 2014-11-28 DIAGNOSIS — R1012 Left upper quadrant pain: Secondary | ICD-10-CM

## 2014-11-28 LAB — PT WITH INR/FINGERSTICK
INR FINGERSTICK: 2.7 — AB (ref 0.80–1.20)
PT, fingerstick: 32.5 seconds — ABNORMAL HIGH (ref 10.4–12.5)

## 2014-11-28 MED ORDER — RANITIDINE HCL 150 MG PO TABS: 150.0000 mg | ORAL_TABLET | Freq: Every day | ORAL | Status: DC

## 2014-11-28 NOTE — Progress Notes (Signed)
Subjective:    Patient ID: Heather [name], female    DOB: 03/04/1933, 80 y.o.   MRN: [id number]  HPI  Patient is a very pleasant [age] year old white female with a history of end-stage dementia. She also has gastroesophageal reflux disease. She is currently on Nexium 40 mg by mouth daily. She is here today to check her INR related to her Coumadin. Her INR today is therapeutic at 2.7. However she continues to lose weight and have a poor appetite. She is also now starting to develop pain in her left upper quadrant. The pain resolves whenever she is able to belch or pass flatus. She is also complaining of acid reflux. Gaviscon seems to help. She denies any melena or hematochezia. Her husband denies any nausea or vomiting and he denies any constipation or diarrhea. She denies any chest pain or shortness of breath. Past Medical History  Diagnosis Date  . Hyperlipidemia   . Chronic LBP   . Gout 01/2012    right index and middle fingers  . Dysrhythmia     atrial fib  . Dementia   . Scoliosis   . Hypertension     under control  . Full dentures   . Wears glasses   . Pacemaker     dr croitoru[facility]  . Arthritis   . GERD (gastroesophageal reflux disease)   . Atrial fibrillation   . Hiatal hernia   . Seizures 2009    seizure x 1; none since pacemaker insertion   Past Surgical History  Procedure Laterality Date  . Tonsillectomy    . Hernia repair    . Trigger finger release  04/24/2011    release A-1 pulley left index  . Carpal tunnel release  11/14/2009    right  . Appendectomy    . Abdominal hysterectomy    . Heel spur surgery      both feet  . Eye surgery      both cataracts  . Permanent pacemaker insertion  07/08/2008    [employer]  . Shoulder arthroscopy  04/15/2006 - left    right:  10/18/2005, 09/24/2005  . Lumbar laminectomy/decompression microdiscectomy  02/06/2005; 10/21/2003    with fusions   . Back surgery  [id number]  . Cardioversion  08/08/2010    for atrial fib.  . Trigger  finger release  08/10/2003    right thumb  . Wrist arthroscopy  07/04/2010    left wrist; also left CTR  . Spinal growth rods    . Knee arthroscopy      right  . Trigger finger release  01/21/2012    Procedure: RELEASE TRIGGER FINGER/A-1 PULLEY;  Surgeon: Nicki ReaperGary R Kuzma, MD;  Location: [facility];  Service: Orthopedics;  Laterality: Right;  right ring finger  . I&d extremity  02/05/2012    Procedure: IRRIGATION AND DEBRIDEMENT EXTREMITY;  Surgeon: Nicki ReaperGary R Kuzma, MD;  Location: [facility];  Service: Orthopedics;  Laterality: Right;  debridement right index and middle fingers  . Trigger finger release Left 03/17/2013    Procedure: RELEASE TRIGGER FINGER/A-1 PULLEY LEFT MIDDLE, RING AND SMALL FINGERS;  Surgeon: Nicki ReaperGary R Kuzma, MD;  Location: [facility];  Service: Orthopedics;  Laterality: Left;  . Nm myocar perf wall motion  11/24/2007    No ischemia   Current Outpatient Prescriptions on File Prior to Visit  Medication Sig Dispense Refill  . alum hydroxide-mag trisilicate (GAVISCON) 80-20 MG CHEW chewable tablet Chew 2 tablets by mouth as needed  for indigestion or heartburn.    . colchicine 0.6 MG tablet Take 0.6 mg by mouth daily.    Marland Kitchen. diltiazem (CARDIZEM CD) 240 MG 24 hr capsule Take 1 capsule (240 mg total) by mouth daily. 90 capsule 2  . esomeprazole (NEXIUM) 40 MG capsule TAKE 1 CAPSULE BY MOUTH DAILY BEFORE BREAKFAST. 90 capsule 3  . fish oil-omega-3 fatty acids 1000 MG capsule Take 2 g by mouth daily.    . furosemide (LASIX) 40 MG tablet Take 40 mg by mouth daily as needed for fluid.    . furosemide (LASIX) 40 MG tablet TAKE 1 TABLET BY MOUTH DAILY AS NEEDED FOR LEG SWELLING 30 tablet 3  . losartan (COZAAR) 100 MG tablet TAKE 1 TABLET DAILY 90 tablet 3  . memantine (NAMENDA) 10 MG tablet Take 10 mg by mouth 2 (two) times daily.    . potassium chloride SA (K-DUR,KLOR-CON) 20 MEQ tablet Take 20 mEq by mouth as needed.    . sotalol (BETAPACE) 80 MG tablet  TAKE 1 TABLET BY MOUTH TWICE A DAY 180 tablet 3  . traMADol (ULTRAM) 50 MG tablet Take 1 tablet (50 mg total) by mouth 2 (two) times daily as needed. 45 tablet 0  . cyclobenzaprine (FLEXERIL) 10 MG tablet TAKE 1 TABELT BY MOUTH EVERY 8 HOURS (Patient not taking: Reported on 11/28/2014) 90 tablet 1   No current facility-administered medications on file prior to visit.   Allergies  Allergen Reactions  . Oxycodone Hcl Other (See Comments)    HALLUCINATIONS  . Amiodarone     Loss of appetite  . Benazepril Cough  . Prednisone Other (See Comments)    Hallucinations  . Aspirin Other (See Comments)    GI UPSET  . Codeine Other (See Comments)    GI UPSET  . Sulfa Antibiotics Other (See Comments)    GI UPSET   History   Social History  . Marital Status: Married    Spouse Name: N/A    Number of Children: N/A  . Years of Education: N/A   Occupational History  . Not on file.   Social History Main Topics  . Smoking status: Never Smoker   . Smokeless tobacco: Never Used  . Alcohol Use: No  . Drug Use: No  . Sexual Activity: Not on file   Other Topics Concern  . Not on file   Social History Narrative      Review of Systems  All other systems reviewed and are negative.      Objective:   Physical Exam  Cardiovascular: Normal rate and normal heart sounds.   Pulmonary/Chest: Effort normal and breath sounds normal. No respiratory distress. She has no wheezes. She has no rales.  Abdominal: Soft. Bowel sounds are normal. She exhibits no distension. There is no tenderness. There is no rebound and no guarding.  Vitals reviewed.         Assessment & Plan:  LUQ abdominal pain - Plan: ranitidine (ZANTAC) 150 MG tablet  Atrial fibrillation - Plan: PT with INR/Fingerstick  Patient's Coumadin is therapeutic. I will make no changes in her Coumadin dose. Recheck INR in 4-6 weeks. Patient's left upper quadrant pain could be due to a small ulcer. I will supplement her Nexium with  Zantac 150 mg by mouth daily at bedtime. Recheck in 2-3 weeks. If her pain improves and her appetite improves will not work this up further given her age. However she continues to have left upper quadrant abdominal pain, the next tablet  be proceed with an EGD.

## 2014-12-01 ENCOUNTER — Telehealth: Payer: Self-pay | Admitting: Cardiology

## 2014-12-01 ENCOUNTER — Ambulatory Visit (INDEPENDENT_AMBULATORY_CARE_PROVIDER_SITE_OTHER): Payer: Medicare Other | Admitting: *Deleted

## 2014-12-01 DIAGNOSIS — I4891 Unspecified atrial fibrillation: Secondary | ICD-10-CM

## 2014-12-01 NOTE — Progress Notes (Signed)
Remote pacemaker transmission.   

## 2014-12-01 NOTE — Telephone Encounter (Signed)
Confirmed remote transmission w/ pt husband.   

## 2014-12-08 LAB — MDC_IDC_ENUM_SESS_TYPE_REMOTE
Brady Statistic AS VS Percent: 33.76 %
Date Time Interrogation Session: 20151231173817
Lead Channel Impedance Value: 424 Ohm
Lead Channel Impedance Value: 496 Ohm
Lead Channel Sensing Intrinsic Amplitude: 1.3306
Lead Channel Setting Pacing Amplitude: 2 V
Lead Channel Setting Pacing Amplitude: 2.5 V
MDC IDC MSMT BATTERY VOLTAGE: 2.93 V
MDC IDC SET LEADCHNL RV PACING PULSEWIDTH: 0.4 ms
MDC IDC SET LEADCHNL RV SENSING SENSITIVITY: 0.9 mV
MDC IDC SET ZONE DETECTION INTERVAL: 400 ms
MDC IDC STAT BRADY AP VP PERCENT: 2.07 %
MDC IDC STAT BRADY AP VS PERCENT: 7.96 %
MDC IDC STAT BRADY AS VP PERCENT: 56.2 %
MDC IDC STAT BRADY RA PERCENT PACED: 10.03 %
MDC IDC STAT BRADY RV PERCENT PACED: 58.27 %
Zone Setting Detection Interval: 350 ms

## 2014-12-16 ENCOUNTER — Encounter: Payer: Self-pay | Admitting: Cardiology

## 2014-12-22 ENCOUNTER — Encounter: Payer: Self-pay | Admitting: Cardiovascular Disease

## 2015-01-02 ENCOUNTER — Encounter: Payer: Self-pay | Admitting: Family Medicine

## 2015-01-02 ENCOUNTER — Ambulatory Visit (INDEPENDENT_AMBULATORY_CARE_PROVIDER_SITE_OTHER): Payer: Medicare Other | Admitting: Family Medicine

## 2015-01-02 ENCOUNTER — Other Ambulatory Visit: Payer: Self-pay | Admitting: Family Medicine

## 2015-01-02 DIAGNOSIS — I4891 Unspecified atrial fibrillation: Secondary | ICD-10-CM

## 2015-01-02 LAB — PT WITH INR/FINGERSTICK
INR, fingerstick: 2.9 — ABNORMAL HIGH (ref 0.80–1.20)
PT, fingerstick: 35.2 seconds — ABNORMAL HIGH (ref 10.4–12.5)

## 2015-01-02 NOTE — Progress Notes (Signed)
Subjective:    Patient ID: Heather [name], female    DOB: 03/20/1933, 81 y.o.   MRN: [id number]  HPI    Review of Systems     Objective:   Physical Exam        Assessment & Plan:    Subjective:    Patient ID: Heather Himda C [name], female    DOB: 03/06/1933, 81 y.o.   MRN: [id number]  HPI  Patient is a very pleasant 81 year old white female with a history of end-stage dementia.  She is here today to check her INR related to her Coumadin. Her INR today is therapeutic at 2.9. She denies any chest pain or shortness of breath. Past Medical History  Diagnosis Date  . Hyperlipidemia   . Chronic LBP   . Gout 01/2012    right index and middle fingers  . Dysrhythmia     atrial fib  . Dementia   . Scoliosis   . Hypertension     under control  . Full dentures   . Wears glasses   . Pacemaker     dr croitoru-[facility]  . Arthritis   . GERD (gastroesophageal reflux disease)   . Atrial fibrillation   . Hiatal hernia   . Seizures 2009    seizure x 1; none since pacemaker insertion   Past Surgical History  Procedure Laterality Date  . Tonsillectomy    . Hernia repair    . Trigger finger release  04/24/2011    release A-1 pulley left index  . Carpal tunnel release  11/14/2009    right  . Appendectomy    . Abdominal hysterectomy    . Heel spur surgery      both feet  . Eye surgery      both cataracts  . Permanent pacemaker insertion  07/08/2008    [employer]  . Shoulder arthroscopy  04/15/2006 - left    right:  10/18/2005, 09/24/2005  . Lumbar laminectomy/decompression microdiscectomy  02/06/2005; 10/21/2003    with fusions   . Back surgery  [id number]  . Cardioversion  08/08/2010    for atrial fib.  . Trigger finger release  08/10/2003    right thumb  . Wrist arthroscopy  07/04/2010    left wrist; also left CTR  . Spinal growth rods    . Knee arthroscopy      right  . Trigger finger release  01/21/2012    Procedure: RELEASE TRIGGER FINGER/A-1 PULLEY;  Surgeon: Nicki ReaperGary R Kuzma, MD;   Location: [facility];  Service: Orthopedics;  Laterality: Right;  right ring finger  . I&d extremity  02/05/2012    Procedure: IRRIGATION AND DEBRIDEMENT EXTREMITY;  Surgeon: Nicki ReaperGary R Kuzma, MD;  Location: [facility];  Service: Orthopedics;  Laterality: Right;  debridement right index and middle fingers  . Trigger finger release Left 03/17/2013    Procedure: RELEASE TRIGGER FINGER/A-1 PULLEY LEFT MIDDLE, RING AND SMALL FINGERS;  Surgeon: Nicki ReaperGary R Kuzma, MD;  [facility];  Service: Orthopedics;  Laterality: Left;  . Nm myocar perf wall motion  11/24/2007    No ischemia   Current Outpatient Prescriptions on File Prior to Visit  Medication Sig Dispense Refill  . alum hydroxide-mag trisilicate (GAVISCON) 80-20 MG CHEW chewable tablet Chew 2 tablets by mouth as needed for indigestion or heartburn.    . colchicine 0.6 MG tablet Take 0.6 mg by mouth daily.    Marland Kitchen. diltiazem (CARDIZEM CD) 240 MG 24 hr capsule Take  1 capsule (240 mg total) by mouth daily. 90 capsule 2  . esomeprazole (NEXIUM) 40 MG capsule TAKE 1 CAPSULE BY MOUTH DAILY BEFORE BREAKFAST. 90 capsule 3  . fish oil-omega-3 fatty acids 1000 MG capsule Take 2 g by mouth daily.    . furosemide (LASIX) 40 MG tablet Take 40 mg by mouth daily as needed for fluid.    . furosemide (LASIX) 40 MG tablet TAKE 1 TABLET BY MOUTH DAILY AS NEEDED FOR LEG SWELLING 30 tablet 3  . losartan (COZAAR) 100 MG tablet TAKE 1 TABLET DAILY 90 tablet 3  . memantine (NAMENDA) 10 MG tablet Take 10 mg by mouth 2 (two) times daily.    . potassium chloride SA (K-DUR,KLOR-CON) 20 MEQ tablet Take 20 mEq by mouth as needed.    . sotalol (BETAPACE) 80 MG tablet TAKE 1 TABLET BY MOUTH TWICE A DAY 180 tablet 3  . traMADol (ULTRAM) 50 MG tablet Take 1 tablet (50 mg total) by mouth 2 (two) times daily as needed. 45 tablet 0  . cyclobenzaprine (FLEXERIL) 10 MG tablet TAKE 1 TABELT BY MOUTH EVERY 8 HOURS (Patient not taking: Reported on  11/28/2014) 90 tablet 1   No current facility-administered medications on file prior to visit.   Allergies  Allergen Reactions  . Oxycodone Hcl Other (See Comments)    HALLUCINATIONS  . Amiodarone     Loss of appetite  . Benazepril Cough  . Prednisone Other (See Comments)    Hallucinations  . Aspirin Other (See Comments)    GI UPSET  . Codeine Other (See Comments)    GI UPSET  . Sulfa Antibiotics Other (See Comments)    GI UPSET   History   Social History  . Marital Status: Married    Spouse Name: N/A    Number of Children: N/A  . Years of Education: N/A   Occupational History  . Not on file.   Social History Main Topics  . Smoking status: Never Smoker   . Smokeless tobacco: Never Used  . Alcohol Use: No  . Drug Use: No  . Sexual Activity: Not on file   Other Topics Concern  . Not on file   Social History Narrative      Review of Systems  All other systems reviewed and are negative.      Objective:   Physical Exam  Cardiovascular: Normal rate and normal heart sounds.   Pulmonary/Chest: Effort normal and breath sounds normal. No respiratory distress. She has no wheezes. She has no rales.  Abdominal: Soft. Bowel sounds are normal. She exhibits no distension. There is no tenderness. There is no rebound and no guarding.  Vitals reviewed.         Assessment & Plan:    Atrial fibrillation - Plan: PT with INR/Fingerstick  Patient's Coumadin is therapeutic. I will make no changes in her Coumadin dose. Recheck INR in 4-6 weeks.

## 2015-02-10 ENCOUNTER — Telehealth: Payer: Self-pay | Admitting: Family Medicine

## 2015-02-10 MED ORDER — FLUCONAZOLE 150 MG PO TABS: 150.0000 mg | ORAL_TABLET | Freq: Once | ORAL | Status: DC

## 2015-02-10 NOTE — Telephone Encounter (Signed)
Ok, diflucan 150 po x1

## 2015-02-10 NOTE — Telephone Encounter (Signed)
Called family about Diflucan.  Now wants something to help her sleep.  She has kept them up the last few nights and she is wearing them out.  Family states does not sleep during the day either.  Is there anything they can give her??

## 2015-02-10 NOTE — Telephone Encounter (Signed)
[facility] [address]   [place], patients daughter in law calling to see if she can get rx for difulcan  [phone]

## 2015-02-13 ENCOUNTER — Encounter: Payer: Self-pay | Admitting: Family Medicine

## 2015-02-13 ENCOUNTER — Ambulatory Visit (INDEPENDENT_AMBULATORY_CARE_PROVIDER_SITE_OTHER): Payer: Medicare Other | Admitting: Family Medicine

## 2015-02-13 VITALS — BP 140/70 | HR 68 | Temp 98.0°F | Resp 12 | Ht 61.0 in

## 2015-02-13 DIAGNOSIS — I4891 Unspecified atrial fibrillation: Secondary | ICD-10-CM

## 2015-02-13 DIAGNOSIS — F0391 Unspecified dementia with behavioral disturbance: Secondary | ICD-10-CM | POA: Diagnosis not present

## 2015-02-13 DIAGNOSIS — G43A1 Cyclical vomiting, intractable: Secondary | ICD-10-CM | POA: Diagnosis not present

## 2015-02-13 DIAGNOSIS — N390 Urinary tract infection, site not specified: Secondary | ICD-10-CM | POA: Diagnosis not present

## 2015-02-13 DIAGNOSIS — R142 Eructation: Secondary | ICD-10-CM

## 2015-02-13 DIAGNOSIS — R1010 Upper abdominal pain, unspecified: Secondary | ICD-10-CM | POA: Diagnosis not present

## 2015-02-13 DIAGNOSIS — R1115 Cyclical vomiting syndrome unrelated to migraine: Secondary | ICD-10-CM

## 2015-02-13 DIAGNOSIS — F03918 Unspecified dementia, unspecified severity, with other behavioral disturbance: Secondary | ICD-10-CM

## 2015-02-13 LAB — URINALYSIS, MICROSCOPIC ONLY
CASTS: NONE SEEN
CRYSTALS: NONE SEEN
WBC, UA: NONE SEEN WBC/hpf (ref <3)

## 2015-02-13 LAB — URINALYSIS, ROUTINE W REFLEX MICROSCOPIC
BILIRUBIN URINE: NEGATIVE
GLUCOSE, UA: NEGATIVE mg/dL
Ketones, ur: NEGATIVE mg/dL
Nitrite: NEGATIVE
Protein, ur: NEGATIVE mg/dL
Specific Gravity, Urine: 1.02 (ref 1.005–1.030)
Urobilinogen, UA: 1 mg/dL (ref 0.0–1.0)
pH: 6 (ref 5.0–8.0)

## 2015-02-13 LAB — PT WITH INR/FINGERSTICK
INR FINGERSTICK: 3.5 — AB (ref 0.80–1.20)
PT, fingerstick: 42 seconds — ABNORMAL HIGH (ref 10.4–12.5)

## 2015-02-13 MED ORDER — ALPRAZOLAM 0.5 MG PO TABS: 0.5000 mg | ORAL_TABLET | Freq: Every evening | ORAL | Status: DC | PRN

## 2015-02-13 NOTE — Progress Notes (Signed)
Subjective:    Patient ID: Heather [name], female    DOB: [birth year], 81 y.o.   MRN: [id number]  HPI Patient has severe end-stage dementia. She is currently on Coumadin 3 mg on Mondays and Fridays 2 mg on all other days for atrial fibrillation. INR today is supratherapeutic at 3.5. She has recently been on antibiotics for presumed urinary tract infection. Her family called me on call 1 evening and she was having polyuria and dysuria and more confusion and I empirically treated her with Keflex. The polyuria has improved although she is still quite confused. Her husband has recently been in the hospital with stage IV metastatic prostate cancer. This is been very disoriented for this patient as she has been left at home alone with strangers. She is now experiencing sundowning and refusing to sleep. The family is requesting something to help her sleep. I also requesting on health evaluation for possible assistance with ADLs. She is also having at least a one-month history of intractable nausea and vomiting and burping. She will eat and then began burping profusely. She also developed intractable nausea for several hours with nonbilious emesis. She does have a history of a bowel obstruction in the past. Past Medical History  Diagnosis Date  . Hyperlipidemia   . Chronic LBP   . Gout 01/2012    right index and middle fingers  . Dysrhythmia     atrial fib  . Dementia   . Scoliosis   . Hypertension     under control  . Full dentures   . Wears glasses   . Pacemaker     dr croitoru-[facility]  . Arthritis   . GERD (gastroesophageal reflux disease)   . Atrial fibrillation   . Hiatal hernia   . Seizures 2009    seizure x 1; none since pacemaker insertion   Past Surgical History  Procedure Laterality Date  . Tonsillectomy    . Hernia repair    . Trigger finger release  04/24/2011    release A-1 pulley left index  . Carpal tunnel release  11/14/2009    right  . Appendectomy    . Abdominal hysterectomy     . Heel spur surgery      both feet  . Eye surgery      both cataracts  . Permanent pacemaker insertion  07/08/2008    Medtronic  . Shoulder arthroscopy  04/15/2006 - left    right:  10/18/2005, 09/24/2005  . Lumbar laminectomy/decompression microdiscectomy  02/06/2005; 10/21/2003    with fusions   . Back surgery  4098,1191  . Cardioversion  08/08/2010    for atrial fib.  . Trigger finger release  08/10/2003    right thumb  . Wrist arthroscopy  07/04/2010    left wrist; also left CTR  . Spinal growth rods    . Knee arthroscopy      right  . Trigger finger release  01/21/2012    Procedure: RELEASE TRIGGER FINGER/A-1 PULLEY;  Surgeon: Nicki Reaper, MD;  Location: [facility];  Service: Orthopedics;  Laterality: Right;  right ring finger  . I&d extremity  02/05/2012    Procedure: IRRIGATION AND DEBRIDEMENT EXTREMITY;  Surgeon: Nicki Reaper, MD;  Location: [facility];  Service: Orthopedics;  Laterality: Right;  debridement right index and middle fingers  . Trigger finger release Left 03/17/2013    Procedure: RELEASE TRIGGER FINGER/A-1 PULLEY LEFT MIDDLE, RING AND SMALL FINGERS;  Surgeon: Nicki Reaper, MD;  Location: [facility];  Service: Orthopedics;  Laterality: Left;  . Nm myocar perf wall motion  11/24/2007    No ischemia   Current Outpatient Prescriptions on File Prior to Visit  Medication Sig Dispense Refill  . alum hydroxide-mag trisilicate (GAVISCON) 80-20 MG CHEW chewable tablet Chew 2 tablets by mouth as needed for indigestion or heartburn.    . colchicine 0.6 MG tablet Take 0.6 mg by mouth daily.    . cyclobenzaprine (FLEXERIL) 10 MG tablet TAKE 1 TABELT BY MOUTH EVERY 8 HOURS 90 tablet 1  . diltiazem (CARDIZEM CD) 240 MG 24 hr capsule Take 1 capsule (240 mg total) by mouth daily. 90 capsule 2  . esomeprazole (NEXIUM) 40 MG capsule TAKE 1 CAPSULE BY MOUTH DAILY BEFORE BREAKFAST. 90 capsule 3  . fish oil-omega-3 fatty acids 1000 MG capsule Take  2 g by mouth daily.    . furosemide (LASIX) 40 MG tablet Take 40 mg by mouth daily as needed for fluid.    . furosemide (LASIX) 40 MG tablet TAKE 1 TABLET BY MOUTH DAILY AS NEEDED FOR LEG SWELLING 30 tablet 3  . losartan (COZAAR) 100 MG tablet TAKE 1 TABLET DAILY 90 tablet 3  . memantine (NAMENDA) 10 MG tablet Take 10 mg by mouth 2 (two) times daily.    . memantine (NAMENDA) 10 MG tablet TAKE 1 TABLET BY MOUTH TWICE A DAY 180 tablet 3  . potassium chloride SA (K-DUR,KLOR-CON) 20 MEQ tablet Take 20 mEq by mouth as needed.    . ranitidine (ZANTAC) 150 MG tablet Take 1 tablet (150 mg total) by mouth at bedtime. 30 tablet 2  . sotalol (BETAPACE) 80 MG tablet TAKE 1 TABLET BY MOUTH TWICE A DAY 180 tablet 3  . traMADol (ULTRAM) 50 MG tablet Take 1 tablet (50 mg total) by mouth 2 (two) times daily as needed. 45 tablet 0  . warfarin (COUMADIN) 1 MG tablet Take 2 mg by mouth daily.     No current facility-administered medications on file prior to visit.   Allergies  Allergen Reactions  . Oxycodone Hcl Other (See Comments)    HALLUCINATIONS  . Amiodarone     Loss of appetite  . Benazepril Cough  . Prednisone Other (See Comments)    Hallucinations  . Aspirin Other (See Comments)    GI UPSET  . Codeine Other (See Comments)    GI UPSET  . Sulfa Antibiotics Other (See Comments)    GI UPSET   History   Social History  . Marital Status: Married    Spouse Name: N/A  . Number of Children: N/A  . Years of Education: N/A   Occupational History  . Not on file.   Social History Main Topics  . Smoking status: Never Smoker   . Smokeless tobacco: Never Used  . Alcohol Use: No  . Drug Use: No  . Sexual Activity: Not on file   Other Topics Concern  . Not on file   Social History Narrative      Review of Systems  All other systems reviewed and are negative.      Objective:   Physical Exam  Constitutional: She appears well-developed.  Cardiovascular: Normal rate and normal heart  sounds.   Pulmonary/Chest: Effort normal and breath sounds normal. No respiratory distress. She has no wheezes. She has no rales.  Abdominal: She exhibits no distension. There is tenderness.  Psychiatric: Her speech is delayed. She is slowed. Cognition and memory are impaired.  Vitals  reviewed.         Assessment & Plan:  Urinary tract infection without hematuria, site unspecified - Plan: Urinalysis, Routine w reflex microscopic  Atrial fibrillation - Plan: PT with INR/Fingerstick  Intractable cyclical vomiting with nausea - Plan: CT Abdomen Pelvis W Contrast  Burping - Plan: CT Abdomen Pelvis W Contrast  Dementia with behavioral disturbance - Plan: Ambulatory referral to Home Health  Pain of upper abdomen - Plan: CT Abdomen Pelvis W Contrast  Hold the Coumadin today. Then decrease Coumadin to 2 mg a day and recheck in 4 weeks. I will obtain a urinalysis to ensure that the urinary tract infection has completely resolved. I will also obtain a CT scan of the abdomen and pelvis to rule out small bowel obstruction. If the CT scan is normal she will need a GI consultation for an EGD to rule out some type of esophageal stricture. I will give the patient Xanax 0.5 mg 1-2 tablets at night for confusion and delirium. I will also consult home health nursing for assistance with ADLs.

## 2015-02-13 NOTE — Addendum Note (Signed)
Addended by: Yaire Kreher, [place] on: 02/13/2015 04:16 PM   Modules accepted: Orders

## 2015-02-14 ENCOUNTER — Telehealth: Payer: Self-pay | Admitting: *Deleted

## 2015-02-14 ENCOUNTER — Other Ambulatory Visit: Payer: Self-pay | Admitting: Family Medicine

## 2015-02-14 DIAGNOSIS — G43A1 Cyclical vomiting, intractable: Secondary | ICD-10-CM

## 2015-02-14 DIAGNOSIS — R142 Eructation: Secondary | ICD-10-CM

## 2015-02-14 DIAGNOSIS — R1010 Upper abdominal pain, unspecified: Secondary | ICD-10-CM

## 2015-02-14 NOTE — Telephone Encounter (Signed)
Pt is scheduled to have CT scan of abd/pelvis w/Contrast and [facility] states pt needs to have BUN and CREAT before her scheduled appt on Friday March 18 at 12:30pm arrival 12:10p, pt son is aware of appt

## 2015-02-16 ENCOUNTER — Telehealth: Payer: Self-pay | Admitting: Family Medicine

## 2015-02-16 NOTE — Telephone Encounter (Signed)
[facility] calling to speak with you regarding this patients appointment  Tomorrow  [phone]

## 2015-02-16 NOTE — Telephone Encounter (Signed)
Received lab work from [facility] with the creatine and BUN, faxed copy to [facility] attn Rebecca

## 2015-02-17 ENCOUNTER — Ambulatory Visit
Admission: RE | Admit: 2015-02-17 | Discharge: 2015-02-17 | Disposition: A | Discharge status: Home / Self Care | Payer: Medicare Other | Source: Ambulatory Visit | Attending: Family Medicine | Admitting: Family Medicine

## 2015-02-17 ENCOUNTER — Telehealth: Payer: Self-pay | Admitting: Cardiovascular Disease

## 2015-02-17 DIAGNOSIS — R142 Eructation: Secondary | ICD-10-CM

## 2015-02-17 DIAGNOSIS — R1115 Cyclical vomiting syndrome unrelated to migraine: Secondary | ICD-10-CM

## 2015-02-17 DIAGNOSIS — R1010 Upper abdominal pain, unspecified: Secondary | ICD-10-CM

## 2015-02-17 MED ORDER — IOPAMIDOL (ISOVUE-300) INJECTION 61%
100.0000 mL | Freq: Once | INTRAVENOUS | Status: AC | PRN
  Administered 2015-02-17: 100 mL via INTRAVENOUS

## 2015-02-17 NOTE — Telephone Encounter (Signed)
Heather [name] is calling because they have notice a that she has slowed down . Not sure if it may be the battery in her pacemaker . Please call   Thanks

## 2015-02-17 NOTE — Telephone Encounter (Signed)
Spoke with dennis, the husband has been the primary care giver in the past and he is now sick. The children are trying to take care of both parents at this time. Their concern is that a week ago the patient could walk to the mailbox and back, slowly but without problems. This week she has to have help ambulating within the home. She was seen by the primary care doctor on tuesday this week and everything checked out fine. Explained that per the remote check in January, everything with the pacemaker battery was fine. Heather MinisterDennis voiced understanding and will keep the f/u appt later this month.

## 2015-02-20 ENCOUNTER — Other Ambulatory Visit: Payer: Self-pay | Admitting: Family Medicine

## 2015-02-20 DIAGNOSIS — N2889 Other specified disorders of kidney and ureter: Secondary | ICD-10-CM

## 2015-02-27 ENCOUNTER — Encounter: Payer: Self-pay | Admitting: Cardiovascular Disease

## 2015-02-27 ENCOUNTER — Ambulatory Visit (INDEPENDENT_AMBULATORY_CARE_PROVIDER_SITE_OTHER): Payer: Medicare Other | Admitting: Cardiovascular Disease

## 2015-02-27 VITALS — BP 108/72 | HR 64 | Resp 16 | Ht 63.0 in | Wt 116.0 lb

## 2015-02-27 DIAGNOSIS — I10 Essential (primary) hypertension: Secondary | ICD-10-CM | POA: Diagnosis not present

## 2015-02-27 DIAGNOSIS — Z950 Presence of cardiac pacemaker: Secondary | ICD-10-CM | POA: Diagnosis not present

## 2015-02-27 DIAGNOSIS — N183 Chronic kidney disease, stage 3 unspecified: Secondary | ICD-10-CM

## 2015-02-27 DIAGNOSIS — I481 Persistent atrial fibrillation: Secondary | ICD-10-CM | POA: Diagnosis not present

## 2015-02-27 DIAGNOSIS — I4819 Other persistent atrial fibrillation: Secondary | ICD-10-CM

## 2015-02-27 NOTE — Patient Instructions (Signed)
Remote monitoring is used to monitor your pacemaker from home. This monitoring reduces the number of office visits required to check your device to one time per year. It allows us to keep an eye on the functioning of your device to ensure it is working properly. You are scheduled for a device check from home on 05/29/2015. You may send your transmission at any time that day. If you have a wireless device, the transmission will be sent automatically. After your physician reviews your transmission, you will receive a postcard with your next transmission date.  Your physician recommends that you schedule a follow-up appointment in: 6 months with Dr.Croitoru

## 2015-02-27 NOTE — Progress Notes (Signed)
Patient ID: Heather [name], female   DOB: 1932-12-20, 81 y.o.   MRN: [id number]     Cardiology Office Note   Date:  02/27/2015   ID:  Heather [name], DOB 01-26-1933, MRN [id number]  PCP:  Leo Grosser, MD  Cardiologist:   Thurmon Fair, MD   Chief Complaint  Patient presents with  . ROV 6 months    patient's son reports a decrease in activity/walking       History of Present Illness: Heather [name] is a 81 y.o. female who presents for persistent atrial fibrillation and pacemaker check.  She usually comes with her husband, who has been a major support for her as her dementia progresses. He has been diagnosed with widely metastatic prostate cancer and is receiving treatment. Heather [name]'s son is here with her today. She is more agitated than usual. She has not been going on her daily walks and the current change in schedule and family dynamics has clearly led to some cognitive deterioration and confusion.  Her Medtronic Enrhythm dual chamber pacemaker was implanted in 2009 and is functioning normally. She was essentially pacemaker dependent due to severe sinus bradycardia/sinus arrest. She seems to have settled into near-permanent atrial fibrillation over the last 11 months or so. As before, she appears to be oblivious to the arrhythmia. She takes chronic sotalol therapy. Her device clearly documents brief "normal rhythm" (atrial paced, ventricular sensed) in close proximity to an episode of nonsustained VT just a few days ago. No evidence of torsades de pointes. She takes chronic warfarin therapy and has not had bleeding complications or focal neurological events. She has about 50% V pacing. Battery voltage is 2.93V. Normal lead parameters.    Past Medical History  Diagnosis Date  . Hyperlipidemia   . Chronic LBP   . Gout 01/2012    right index and middle fingers  . Dysrhythmia     atrial fib  . Dementia   . Scoliosis   . Hypertension     under control  . Full dentures   . Wears  glasses   . Pacemaker     dr Barnabas Henriques[facility]  . Arthritis   . GERD (gastroesophageal reflux disease)   . Atrial fibrillation   . Hiatal hernia   . Seizures 2009    seizure x 1; none since pacemaker insertion    Past Surgical History  Procedure Laterality Date  . Tonsillectomy    . Hernia repair    . Trigger finger release  04/24/2011    release A-1 pulley left index  . Carpal tunnel release  11/14/2009    right  . Appendectomy    . Abdominal hysterectomy    . Heel spur surgery      both feet  . Eye surgery      both cataracts  . Permanent pacemaker insertion  07/08/2008    Medtronic  . Shoulder arthroscopy  04/15/2006 - left    right:  10/18/2005, 09/24/2005  . Lumbar laminectomy/decompression microdiscectomy  02/06/2005; 10/21/2003    with fusions   . Back surgery  5621,3086  . Cardioversion  08/08/2010    for atrial fib.  . Trigger finger release  08/10/2003    right thumb  . Wrist arthroscopy  07/04/2010    left wrist; also left CTR  . Spinal growth rods    . Knee arthroscopy      right  . Trigger finger release  01/21/2012    Procedure: RELEASE TRIGGER FINGER/A-1 PULLEY;  Surgeon: Jillyn Hidden  Georges Lynch, MD;  Location: [facility];  Service: Orthopedics;  Laterality: Right;  right ring finger  . I&d extremity  02/05/2012    Procedure: IRRIGATION AND DEBRIDEMENT EXTREMITY;  Surgeon: Nicki Reaper, MD;  Location: [facility];  Service: Orthopedics;  Laterality: Right;  debridement right index and middle fingers  . Trigger finger release Left 03/17/2013    Procedure: RELEASE TRIGGER FINGER/A-1 PULLEY LEFT MIDDLE, RING AND SMALL FINGERS;  Surgeon: Nicki Reaper, MD;  Location: [facility];  Service: Orthopedics;  Laterality: Left;  . Nm myocar perf wall motion  11/24/2007    No ischemia     Current Outpatient Prescriptions  Medication Sig Dispense Refill  . ALPRAZolam (XANAX) 0.5 MG tablet Take 1 tablet (0.5 mg total) by mouth at bedtime as needed for  anxiety. 30 tablet 1  . alum hydroxide-mag trisilicate (GAVISCON) 80-20 MG CHEW chewable tablet Chew 2 tablets by mouth as needed for indigestion or heartburn.    . colchicine 0.6 MG tablet Take 0.6 mg by mouth daily.    Marland Kitchen diltiazem (CARDIZEM CD) 240 MG 24 hr capsule Take 1 capsule (240 mg total) by mouth daily. 90 capsule 2  . esomeprazole (NEXIUM) 40 MG capsule TAKE 1 CAPSULE BY MOUTH DAILY BEFORE BREAKFAST. 90 capsule 3  . fish oil-omega-3 fatty acids 1000 MG capsule Take 2 g by mouth daily.    . furosemide (LASIX) 40 MG tablet TAKE 1 TABLET BY MOUTH DAILY AS NEEDED FOR LEG SWELLING 30 tablet 3  . losartan (COZAAR) 100 MG tablet TAKE 1 TABLET DAILY 90 tablet 3  . memantine (NAMENDA) 10 MG tablet TAKE 1 TABLET BY MOUTH TWICE A DAY 180 tablet 3  . potassium chloride SA (K-DUR,KLOR-CON) 20 MEQ tablet Take 20 mEq by mouth as needed.    Marland Kitchen QUEtiapine (SEROQUEL) 25 MG tablet Take 25-50 mg by mouth at bedtime.    . ranitidine (ZANTAC) 150 MG tablet Take 1 tablet (150 mg total) by mouth at bedtime. 30 tablet 2  . sotalol (BETAPACE) 80 MG tablet TAKE 1 TABLET BY MOUTH TWICE A DAY 180 tablet 3  . warfarin (COUMADIN) 1 MG tablet Take 2 mg by mouth daily.     No current facility-administered medications for this visit.    Allergies:   Oxycodone hcl; Amiodarone; Benazepril; Prednisone; Aspirin; Codeine; and Sulfa antibiotics    Social History:  The patient  reports that she has never smoked. She has never used smokeless tobacco. She reports that she does not drink alcohol or use illicit drugs.   Family History:  The patient's family history includes Alzheimer's disease in her father; Heart failure in her mother; Stroke in her sister.    ROS:  Please see the history of present illness.    The patient specifically denies any chest pain at rest or with exertion, dyspnea at rest or with exertion, orthopnea, paroxysmal nocturnal dyspnea, syncope, palpitations, focal neurological deficits, intermittent  claudication, lower extremity edema, unexplained weight gain, cough, hemoptysis or wheezing.  The patient also denies abdominal pain, nausea, vomiting, dysphagia, diarrhea, constipation, polyuria, polydipsia, dysuria, hematuria, frequency, urgency, abnormal bleeding or bruising, fever, chills, unexpected weight changes, mood swings, change in skin or hair texture, change in voice quality, auditory or visual problems, allergic reactions or rashes, new musculoskeletal complaints other than usual "aches and pains". All other systems are reviewed and negative.    PHYSICAL EXAM: VS:  BP 108/72 mmHg  Pulse 64  Resp 16  Ht 5\' 3"  (1.6 m)  Wt 116 lb (52.617 kg)  BMI 20.55 kg/m2 , BMI Body mass index is 20.55 kg/(m^2).  General: Alert, oriented to self and her son, anxious, but no true distress Head: no evidence of trauma, PERRL, EOMI, no exophtalmos or lid lag, no myxedema, no xanthelasma; normal ears, nose and oropharynx Neck: normal jugular venous pulsations and no hepatojugular reflux; brisk carotid pulses without delay and no carotid bruits Chest: clear to auscultation, no signs of consolidation by percussion or palpation, normal fremitus, symmetrical and full respiratory excursions, healthy pacemaker site Cardiovascular: normal position and quality of the apical impulse, regular rhythm, normal first and second heart sounds, no rubs or gallops, holosystolic murmur at the left lower sternal border Abdomen: no tenderness or distention, no masses by palpation, no abnormal pulsatility or arterial bruits, normal bowel sounds, no hepatosplenomegaly Extremities: no clubbing, cyanosis or edema; 2+ radial, ulnar and brachial pulses bilaterally; 2+ right femoral, posterior tibial and dorsalis pedis pulses; 2+ left femoral, posterior tibial and dorsalis pedis pulses; no subclavian or femoral bruits Neurological: grossly nonfocal Psych: euthymic mood, full affect   EKG:  EKG is ordered today. The ekg  ordered today demonstrates atrial fibrillation with 100% V pacing. "Normal" QTc when adjusted for pacing   Wt Readings from Last 3 Encounters:  02/27/15 116 lb (52.617 kg)  01/02/15 120 lb (54.432 kg)  11/28/14 123 lb (55.792 kg)     ASSESSMENT AND PLAN:  Atrial fibrillation, persistent More aggressive therapy for her arrhythmia does not appear to be justified.  It is interesting that she recently had cessation of atrial fibrillation. Will continue sotalol for now, but the threshold for stopping it is low if she still has persistent AF at the next check. Get labs from PCP.   Pacemaker dual chamber implanted August 2009 for sinus node dysfunction Medtronic Enrhythm Normal device function. When not in atrial fibrillation, she paced the atrium 100%. Ventricular pacing occurs roughly 50% of the time. Continue Carelink monitoring. If still in AFib, change to VVIR at next appointment.  Heart murmur She has aortic valve sclerosis without stenosis, mild aortic insufficiency and moderate tricuspid insufficiency by previous echo   Current medicines are reviewed at length with the patient today.  The patient does not have concerns regarding medicines.  The following changes have been made:  no change  Labs/ tests ordered today include:  No orders of the defined types were placed in this encounter.   Patient Instructions  Remote monitoring is used to monitor your pacemaker from home. This monitoring reduces the number of office visits required to check your device to one time per year. It allows [place] to keep an eye on the functioning of your device to ensure it is working properly. You are scheduled for a device check from home on 05/29/2015. You may send your transmission at any time that day. If you have a wireless device, the transmission will be sent automatically. After your physician reviews your transmission, you will receive a postcard with your next transmission date.  Your physician  recommends that you schedule a follow-up appointment in: 6 months with Dr.Namiko Pritts    Signed, Thurmon Fair, MD  02/27/2015 8:01 PM    Thurmon Fair, MD, [facility] [phone] office [phone] pager

## 2015-02-28 ENCOUNTER — Telehealth: Payer: Self-pay | Admitting: *Deleted

## 2015-02-28 NOTE — Telephone Encounter (Signed)
-----   Message from Thurmon FairMihai Croitoru, MD sent at 02/27/2015  8:13 PM EDT ----- I forgot, but I think she needs a BMET, nonurgent

## 2015-02-28 NOTE — Telephone Encounter (Signed)
Dr. Tanya NonesPickard has ordered CMET.  Spoke with husband and she is to go to [facility] for labs 03/24/15 - asked that they have a copy sent to Dr. Salena Saner for review so we do not repeat any tests.  Voiced understanding.

## 2015-03-02 ENCOUNTER — Other Ambulatory Visit: Payer: Self-pay | Admitting: Family Medicine

## 2015-03-02 LAB — MDC_IDC_ENUM_SESS_TYPE_INCLINIC
Brady Statistic AP VP Percent: 1.66 %
Brady Statistic AS VS Percent: 44.95 %
Date Time Interrogation Session: 20160328165015
Lead Channel Impedance Value: 424 Ohm
Lead Channel Impedance Value: 512 Ohm
Lead Channel Sensing Intrinsic Amplitude: 0.5322
Lead Channel Setting Pacing Amplitude: 2.5 V
Lead Channel Setting Pacing Pulse Width: 0.4 ms
Lead Channel Setting Sensing Sensitivity: 0.9 mV
MDC IDC MSMT BATTERY VOLTAGE: 2.93 V
MDC IDC SET LEADCHNL RA PACING AMPLITUDE: 2 V
MDC IDC SET ZONE DETECTION INTERVAL: 400 ms
MDC IDC STAT BRADY AP VS PERCENT: 5.34 %
MDC IDC STAT BRADY AS VP PERCENT: 48.05 %
MDC IDC STAT BRADY RA PERCENT PACED: 7.01 %
MDC IDC STAT BRADY RV PERCENT PACED: 49.71 %
Zone Setting Detection Interval: 350 ms

## 2015-03-03 ENCOUNTER — Encounter: Payer: Self-pay | Admitting: Cardiovascular Disease

## 2015-03-03 NOTE — Telephone Encounter (Signed)
Medication refilled per protocol. 

## 2015-03-07 ENCOUNTER — Telehealth: Payer: Self-pay | Admitting: Cardiovascular Disease

## 2015-03-07 MED ORDER — DILTIAZEM HCL 90 MG PO TABS: 90.0000 mg | ORAL_TABLET | Freq: Three times a day (TID) | ORAL | Status: DC

## 2015-03-07 NOTE — Telephone Encounter (Signed)
Pt's son called in stating that when it comes time to take her Diltiazem she is unable to swallow the pill, she chews it and as a result 30 mins later he states that she has a "burst of energy ". She takes this medicine at night , so he would like to know if there is another form that she can take that will not have this effect. Please f/u with the pt  Thanks

## 2015-03-07 NOTE — Telephone Encounter (Signed)
Best choice is switching to immediate release diltiazem 90 mg every 8 hours. These can be crushed/cut.

## 2015-03-07 NOTE — Telephone Encounter (Signed)
Concerning Diltiazem 240mg  24hr capsule.  Called pt's son back, LMOM to call me back for instructions.  Plan to see if she can take 2 120mg  capsules instead.

## 2015-03-07 NOTE — Telephone Encounter (Signed)
Advised pt's son on new med, he voiced understanding, thanks for the prompt follow up. No concerns or q's, told to call if any further needs.

## 2015-03-07 NOTE — Telephone Encounter (Signed)
Spoke to pt's son. Pt having a lot of trouble w/ capsules, generally chewing them which is causing the medicine to all be released at once.   Advised I could see if 120mg  ER caps were small enough that dividing the dose into 2 pills would be sensible solution.  Then called pharmacy. They note the 120mg  ER caps are smaller, but not by much. Called back pt's son to tell him this, explained other solution would be to move to tablet TID. He is agreeable to this plan.  Informed caller I will route to Dr. Royann Shiversroitoru for best advice, final recommendation & confirm dosing.  Will plan to call back & inform pt's son of plan.

## 2015-03-16 ENCOUNTER — Other Ambulatory Visit: Payer: Self-pay | Admitting: Family Medicine

## 2015-03-17 NOTE — Telephone Encounter (Signed)
Refill appropriate and filled per protocol. 

## 2015-03-24 ENCOUNTER — Encounter: Payer: Self-pay | Admitting: Family Medicine

## 2015-03-27 ENCOUNTER — Ambulatory Visit (INDEPENDENT_AMBULATORY_CARE_PROVIDER_SITE_OTHER): Payer: Medicare Other | Admitting: Family Medicine

## 2015-03-27 ENCOUNTER — Encounter: Payer: Self-pay | Admitting: Family Medicine

## 2015-03-27 DIAGNOSIS — I4891 Unspecified atrial fibrillation: Secondary | ICD-10-CM

## 2015-03-27 LAB — PT WITH INR/FINGERSTICK
INR, fingerstick: 1.9 — ABNORMAL HIGH (ref 0.80–1.20)
PT, fingerstick: 22.9 seconds — ABNORMAL HIGH (ref 10.4–12.5)

## 2015-03-27 MED ORDER — COLCHICINE 0.6 MG PO TABS: 0.6000 mg | ORAL_TABLET | Freq: Every day | ORAL | Status: DC

## 2015-03-27 NOTE — Progress Notes (Signed)
Subjective:    Patient ID: Heather HimAda C Spiering, female    DOB: 10/05/1933, 81 y.o.   MRN: [id number]  HPI  Patient has severe end-stage dementia. She is currently on Coumadin 2 mg per day  for atrial fibrillation. INR today is borderline at 1.9. She has not been drinking as much fluid recently due to her end-stage dementia. Her by mouth intake is diminished. She appears slightly dehydrated today. Past Medical History  Diagnosis Date  . Hyperlipidemia   . Chronic LBP   . Gout 01/2012    right index and middle fingers  . Dysrhythmia     atrial fib  . Dementia   . Scoliosis   . Hypertension     under control  . Full dentures   . Wears glasses   . Pacemaker     dr croitoru-[facility]  . Arthritis   . GERD (gastroesophageal reflux disease)   . Atrial fibrillation   . Hiatal hernia   . Seizures 2009    seizure x 1; none since pacemaker insertion   Past Surgical History  Procedure Laterality Date  . Tonsillectomy    . Hernia repair    . Trigger finger release  04/24/2011    release A-1 pulley left index  . Carpal tunnel release  11/14/2009    right  . Appendectomy    . Abdominal hysterectomy    . Heel spur surgery      both feet  . Eye surgery      both cataracts  . Permanent pacemaker insertion  07/08/2008    [employer]  . Shoulder arthroscopy  04/15/2006 - left    right:  10/18/2005, 09/24/2005  . Lumbar laminectomy/decompression microdiscectomy  02/06/2005; 10/21/2003    with fusions   . Back surgery  [id number]  . Cardioversion  08/08/2010    for atrial fib.  . Trigger finger release  08/10/2003    right thumb  . Wrist arthroscopy  07/04/2010    left wrist; also left CTR  . Spinal growth rods    . Knee arthroscopy      right  . Trigger finger release  01/21/2012    Procedure: RELEASE TRIGGER FINGER/A-1 PULLEY;  Surgeon: Nicki ReaperGary R Kuzma, MD;  Location: [facility];  Service: Orthopedics;  Laterality: Right;  right ring finger  . I&d extremity  02/05/2012    Procedure:  IRRIGATION AND DEBRIDEMENT EXTREMITY;  Surgeon: Nicki ReaperGary R Kuzma, MD;  Location: [facility];  Service: Orthopedics;  Laterality: Right;  debridement right index and middle fingers  . Trigger finger release Left 03/17/2013    Procedure: RELEASE TRIGGER FINGER/A-1 PULLEY LEFT MIDDLE, RING AND SMALL FINGERS;  Surgeon: Nicki ReaperGary R Kuzma, MD;  Location:  SUR[facility]ERY C[facility];  Service: Orthopedics;  Laterality: Left;  . Nm myocar perf wall motion  11/24/2007    No ischemia   Current Outpatient Prescriptions on File Prior to Visit  Medication Sig Dispense Refill  . ALPRAZolam (XANAX) 0.5 MG tablet Take 1 tablet (0.5 mg total) by mouth at bedtime as needed for anxiety. 30 tablet 1  . alum hydroxide-mag trisilicate (GAVISCON) 80-20 MG CHEW chewable tablet Chew 2 tablets by mouth as needed for indigestion or heartburn.    . diltiazem (CARDIZEM) 90 MG tablet Take 1 tablet (90 mg total) by mouth every 8 (eight) hours. 90 tablet 3  . esomeprazole (NEXIUM) 40 MG capsule TAKE 1 CAPSULE BY MOUTH DAILY BEFORE BREAKFAST. 90 capsule 3  . fish oil-omega-3 fatty acids  1000 MG capsule Take 2 g by mouth daily.    . furosemide (LASIX) 40 MG tablet TAKE 1 TABLET BY MOUTH DAILY AS NEEDED FOR LEG SWELLING 30 tablet 3  . losartan (COZAAR) 100 MG tablet TAKE 1 TABLET DAILY 90 tablet 3  . memantine (NAMENDA) 10 MG tablet TAKE 1 TABLET BY MOUTH TWICE A DAY 180 tablet 3  . potassium chloride SA (K-DUR,KLOR-CON) 20 MEQ tablet Take 20 mEq by mouth as needed.    Marland Kitchen QUEtiapine (SEROQUEL) 25 MG tablet TAKE 1-2 TABLETS BY MOUTH 30 MINS BEFORE BEDTIME AS NEEDED FOR SLEEP OR AGITATION 60 tablet 0  . ranitidine (ZANTAC) 150 MG tablet TAKE 1 TABLET BY MOUTH AT BEDTIME. 30 tablet 5  . sotalol (BETAPACE) 80 MG tablet TAKE 1 TABLET BY MOUTH TWICE A DAY 180 tablet 3  . warfarin (COUMADIN) 1 MG tablet Take 2 mg by mouth daily.     No current facility-administered medications on file prior to visit.   Allergies  Allergen  Reactions  . Oxycodone Hcl Other (See Comments)    HALLUCINATIONS  . Amiodarone     Loss of appetite  . Benazepril Cough  . Prednisone Other (See Comments)    Hallucinations  . Aspirin Other (See Comments)    GI UPSET  . Codeine Other (See Comments)    GI UPSET  . Sulfa Antibiotics Other (See Comments)    GI UPSET   History   Social History  . Marital Status: Married    Spouse Name: N/A  . Number of Children: N/A  . Years of Education: N/A   Occupational History  . Not on file.   Social History Main Topics  . Smoking status: Never Smoker   . Smokeless tobacco: Never Used  . Alcohol Use: No  . Drug Use: No  . Sexual Activity: Not on file   Other Topics Concern  . Not on file   Social History Narrative      Review of Systems  All other systems reviewed and are negative.      Objective:   Physical Exam  Constitutional: She appears well-developed.  Cardiovascular: Normal rate and normal heart sounds.   Pulmonary/Chest: Effort normal and breath sounds normal. No respiratory distress. She has no wheezes. She has no rales.  Abdominal: She exhibits no distension. There is no tenderness.  Psychiatric: Her speech is delayed. She is slowed. Cognition and memory are impaired.  Vitals reviewed.         Assessment & Plan:  Atrial fibrillation - Plan: PT with INR/Fingerstick  Continue Coumadin at its current dose and recheck in 4 weeks. I did recommend that they discontinue the Lasix and only use it as needed. I instructed [name] to always use the potassium on days when they use the Lasix. I am concerned the patient is getting dehydrated due to her poor oral intake secondary to her dementia

## 2015-04-10 ENCOUNTER — Telehealth: Payer: Self-pay | Admitting: Family Medicine

## 2015-04-10 ENCOUNTER — Other Ambulatory Visit: Payer: Self-pay | Admitting: Family Medicine

## 2015-04-10 NOTE — Telephone Encounter (Signed)
Son is with his mother.  States has not voided since 8PM last night.  Is not complaining of any discomfort.  Eating and drinking fine.  Has probably had 10 6oz glasses of fluids today.  Also has not had BM for 2 days.  Said they had given her a glass of Epsom salts to help with that  Is concerned.  Please advise??

## 2015-04-10 NOTE — Telephone Encounter (Signed)
I'd treat constipation first with dulcolax or miralax.  She may void better after a decent BM.  Call back if no better.

## 2015-04-10 NOTE — Telephone Encounter (Signed)
Spoke to spouse.  Still eating and drinking good.  No complaints.  Did have some Dulcolax this morning.

## 2015-05-02 ENCOUNTER — Encounter: Payer: Self-pay | Admitting: Family Medicine

## 2015-05-02 ENCOUNTER — Ambulatory Visit (INDEPENDENT_AMBULATORY_CARE_PROVIDER_SITE_OTHER): Payer: Medicare Other | Admitting: Family Medicine

## 2015-05-02 DIAGNOSIS — N2889 Other specified disorders of kidney and ureter: Secondary | ICD-10-CM | POA: Insufficient documentation

## 2015-05-02 DIAGNOSIS — I4891 Unspecified atrial fibrillation: Secondary | ICD-10-CM | POA: Diagnosis not present

## 2015-05-02 LAB — CBC WITH DIFFERENTIAL/PLATELET
BASOS ABS: 0 10*3/uL (ref 0.0–0.1)
Basophils Relative: 0 % (ref 0–1)
EOS ABS: 0.1 10*3/uL (ref 0.0–0.7)
Eosinophils Relative: 2 % (ref 0–5)
HEMATOCRIT: 34.1 % — AB (ref 36.0–46.0)
Hemoglobin: 10.9 g/dL — ABNORMAL LOW (ref 12.0–15.0)
LYMPHS PCT: 23 % (ref 12–46)
Lymphs Abs: 1.3 10*3/uL (ref 0.7–4.0)
MCH: 28.3 pg (ref 26.0–34.0)
MCHC: 32 g/dL (ref 30.0–36.0)
MCV: 88.6 fL (ref 78.0–100.0)
MPV: 10.8 fL (ref 8.6–12.4)
Monocytes Absolute: 0.6 10*3/uL (ref 0.1–1.0)
Monocytes Relative: 10 % (ref 3–12)
NEUTROS PCT: 65 % (ref 43–77)
Neutro Abs: 3.8 10*3/uL (ref 1.7–7.7)
PLATELETS: 182 10*3/uL (ref 150–400)
RBC: 3.85 MIL/uL — ABNORMAL LOW (ref 3.87–5.11)
RDW: 14.3 % (ref 11.5–15.5)
WBC: 5.8 10*3/uL (ref 4.0–10.5)

## 2015-05-02 LAB — COMPLETE METABOLIC PANEL WITH GFR
ALBUMIN: 3.8 g/dL (ref 3.5–5.2)
ALT: 14 U/L (ref 0–35)
AST: 21 U/L (ref 0–37)
Alkaline Phosphatase: 64 U/L (ref 39–117)
BUN: 27 mg/dL — AB (ref 6–23)
CO2: 27 mEq/L (ref 19–32)
CREATININE: 1.02 mg/dL (ref 0.50–1.10)
Calcium: 9.2 mg/dL (ref 8.4–10.5)
Chloride: 108 mEq/L (ref 96–112)
GFR, EST NON AFRICAN AMERICAN: 52 mL/min — AB
GFR, Est African American: 60 mL/min
GLUCOSE: 90 mg/dL (ref 70–99)
POTASSIUM: 3.7 meq/L (ref 3.5–5.3)
Sodium: 145 mEq/L (ref 135–145)
Total Bilirubin: 0.4 mg/dL (ref 0.2–1.2)
Total Protein: 5.9 g/dL — ABNORMAL LOW (ref 6.0–8.3)

## 2015-05-02 LAB — PT WITH INR/FINGERSTICK
INR, fingerstick: 1.6 — ABNORMAL HIGH (ref 0.80–1.20)
PT, fingerstick: 18.9 seconds — ABNORMAL HIGH (ref 10.4–12.5)

## 2015-05-02 MED ORDER — QUETIAPINE FUMARATE 25 MG PO TABS: ORAL_TABLET | ORAL | Status: DC

## 2015-05-02 MED ORDER — DABIGATRAN ETEXILATE MESYLATE 150 MG PO CAPS: 150.0000 mg | ORAL_CAPSULE | Freq: Two times a day (BID) | ORAL | Status: DC

## 2015-05-02 NOTE — Progress Notes (Signed)
Subjective:    Patient ID: Heather [name], female    DOB: 08-Jun-1933, 81 y.o.   MRN: [id number]  HPI 03/27/15 Patient has severe end-stage dementia. She is currently on Coumadin 2 mg per day  for atrial fibrillation. INR today is borderline at 1.9. She has not been drinking as much fluid recently due to her end-stage dementia. Her by mouth intake is diminished. She appears slightly dehydrated today.  At that time, my plan was: Continue Coumadin at its current dose and recheck in 4 weeks. I did recommend that they discontinue the Lasix and only use it as needed. I instructed [name] to always use the potassium on days when they use the Lasix. I am concerned the patient is getting dehydrated due to her poor oral intake secondary to her dementia.  05/02/15 Patient is here today for recheck of her INR. She is sleeping better now since the addition of Seroquel as well as changing her diltiazem from delayed release which she was inadvertently chewing due to dementia to an immediate release that she takes 90 mg potid.  However she is battling constipation. She is taking MiraLAX on a daily basis which is not helping. This also contributes to urinary retention. Her INR today is subtherapeutic at 1.6. Past Medical History  Diagnosis Date  . Hyperlipidemia   . Chronic LBP   . Gout 01/2012    right index and middle fingers  . Dysrhythmia     atrial fib  . Dementia   . Scoliosis   . Hypertension     under control  . Full dentures   . Wears glasses   . Pacemaker     dr croitoru-[facility]  . Arthritis   . GERD (gastroesophageal reflux disease)   . Atrial fibrillation   . Hiatal hernia   . Seizures 2009    seizure x 1; none since pacemaker insertion   Past Surgical History  Procedure Laterality Date  . Tonsillectomy    . Hernia repair    . Trigger finger release  04/24/2011    release A-1 pulley left index  . Carpal tunnel release  11/14/2009    right  . Appendectomy    . Abdominal hysterectomy    .  Heel spur surgery      both feet  . Eye surgery      both cataracts  . Permanent pacemaker insertion  07/08/2008    [employer]  . Shoulder arthroscopy  04/15/2006 - left    right:  10/18/2005, 09/24/2005  . Lumbar laminectomy/decompression microdiscectomy  02/06/2005; 10/21/2003    with fusions   . Back surgery  [id number]  . Cardioversion  08/08/2010    for atrial fib.  . Trigger finger release  08/10/2003    right thumb  . Wrist arthroscopy  07/04/2010    left wrist; also left CTR  . Spinal growth rods    . Knee arthroscopy      right  . Trigger finger release  01/21/2012    Procedure: RELEASE TRIGGER FINGER/A-1 PULLEY;  Surgeon: Nicki Reaper, MD;  Location: [facility];  Service: Orthopedics;  Laterality: Right;  right ring finger  . I&d extremity  02/05/2012    Procedure: IRRIGATION AND DEBRIDEMENT EXTREMITY;  Surgeon: Nicki Reaper, MD;  Location: [facility];  Service: Orthopedics;  Laterality: Right;  debridement right index and middle fingers  . Trigger finger release Left 03/17/2013    Procedure: RELEASE TRIGGER FINGER/A-1 PULLEY LEFT MIDDLE, RING  AND SMALL FINGERS;  Surgeon: Nicki Reaper, MD;  Location: [facility];  Service: Orthopedics;  Laterality: Left;  . Nm myocar perf wall motion  11/24/2007    No ischemia   Current Outpatient Prescriptions on File Prior to Visit  Medication Sig Dispense Refill  . ALPRAZolam (XANAX) 0.5 MG tablet Take 1 tablet (0.5 mg total) by mouth at bedtime as needed for anxiety. 30 tablet 1  . alum hydroxide-mag trisilicate (GAVISCON) 80-20 MG CHEW chewable tablet Chew 2 tablets by mouth as needed for indigestion or heartburn.    . colchicine 0.6 MG tablet Take 1 tablet (0.6 mg total) by mouth daily. 30 tablet 0  . diltiazem (CARDIZEM) 90 MG tablet Take 1 tablet (90 mg total) by mouth every 8 (eight) hours. 90 tablet 3  . esomeprazole (NEXIUM) 40 MG capsule TAKE 1 CAPSULE BY MOUTH DAILY BEFORE BREAKFAST. 90 capsule 3    . fish oil-omega-3 fatty acids 1000 MG capsule Take 2 g by mouth daily.    . furosemide (LASIX) 40 MG tablet TAKE 1 TABLET BY MOUTH DAILY AS NEEDED FOR LEG SWELLING 30 tablet 3  . losartan (COZAAR) 100 MG tablet TAKE 1 TABLET DAILY 90 tablet 3  . memantine (NAMENDA) 10 MG tablet TAKE 1 TABLET BY MOUTH TWICE A DAY 180 tablet 3  . potassium chloride SA (K-DUR,KLOR-CON) 20 MEQ tablet Take 20 mEq by mouth as needed.    Marland Kitchen QUEtiapine (SEROQUEL) 25 MG tablet TAKE 1-2 TABLETS BY MOUTH 30 MINS BEFORE BEDTIME AS NEEDED FOR SLEEP OR AGITATION 60 tablet 0  . ranitidine (ZANTAC) 150 MG tablet TAKE 1 TABLET BY MOUTH AT BEDTIME. 30 tablet 5  . sotalol (BETAPACE) 80 MG tablet TAKE 1 TABLET BY MOUTH TWICE A DAY 180 tablet 3  . warfarin (COUMADIN) 1 MG tablet Take 2 mg by mouth daily.     No current facility-administered medications on file prior to visit.   Allergies  Allergen Reactions  . Oxycodone Hcl Other (See Comments)    HALLUCINATIONS  . Amiodarone     Loss of appetite  . Benazepril Cough  . Prednisone Other (See Comments)    Hallucinations  . Aspirin Other (See Comments)    GI UPSET  . Codeine Other (See Comments)    GI UPSET  . Sulfa Antibiotics Other (See Comments)    GI UPSET   History   Social History  . Marital Status: Married    Spouse Name: N/A  . Number of Children: N/A  . Years of Education: N/A   Occupational History  . Not on file.   Social History Main Topics  . Smoking status: Never Smoker   . Smokeless tobacco: Never Used  . Alcohol Use: No  . Drug Use: No  . Sexual Activity: Not on file   Other Topics Concern  . Not on file   Social History Narrative      Review of Systems  All other systems reviewed and are negative.      Objective:   Physical Exam  Constitutional: She appears well-developed.  Cardiovascular: Normal rate and normal heart sounds.   Pulmonary/Chest: Effort normal and breath sounds normal. No respiratory distress. She has no  wheezes. She has no rales.  Abdominal: She exhibits no distension. There is no tenderness.  Psychiatric: Her speech is delayed. She is slowed. Cognition and memory are impaired.  Vitals reviewed.         Assessment & Plan:   Atrial fibrillation -  Plan: PT with INR/Fingerstick, COMPLETE METABOLIC PANEL WITH GFR, CBC with Differential/Platelet, PT with INR/Fingerstick  Patient is overdue for regular annual lab work including a CBC to monitor for anemia on her chronic anticoagulation as well as a CMP to monitor her kidney and liver function given the medication she is taking. We'll check that today.  Increase Coumadin to 3 mg on Wednesday and Sunday and 2 mg on all other days. Begin Linzess 145 mcg poqday for chronic constipation. Hopefully this will help with some of the delirium she gets when she is constipated and also help treat some urinary retention she is experiencing. Recheck INR in one month. I refilled her Seroquel. She is taking anywhere from 2-4 tablets at night depending on how delirious she is and how much sundowning she is experiencing

## 2015-05-02 NOTE — Addendum Note (Signed)
Addended by: Legrand RamsWILLIS, Xabi Wittler B on: 05/02/2015 11:00 AM   Modules accepted: Orders, Medications

## 2015-05-02 NOTE — Telephone Encounter (Signed)
Error

## 2015-05-02 NOTE — Telephone Encounter (Signed)
This encounter was created in error - please disregard.

## 2015-05-06 ENCOUNTER — Other Ambulatory Visit: Payer: Self-pay | Admitting: Family Medicine

## 2015-05-09 ENCOUNTER — Other Ambulatory Visit: Payer: Self-pay | Admitting: Family Medicine

## 2015-05-10 NOTE — Telephone Encounter (Signed)
?   OK to Refill  

## 2015-05-11 ENCOUNTER — Telehealth: Payer: Self-pay | Admitting: Family Medicine

## 2015-05-11 NOTE — Telephone Encounter (Signed)
ok 

## 2015-05-11 NOTE — Telephone Encounter (Signed)
Requesting samples of Linzess as it worked really well for her - Samples of Linzess 290 MCG left up front and ok to take mg as per WTP

## 2015-05-11 NOTE — Telephone Encounter (Signed)
Medication called to pharmacy. 

## 2015-05-11 NOTE — Telephone Encounter (Signed)
Patient son calling regarding question about sample medication dr pickard gave pt [phone]

## 2015-05-14 ENCOUNTER — Other Ambulatory Visit: Payer: Self-pay | Admitting: Family Medicine

## 2015-05-19 ENCOUNTER — Telehealth: Payer: Self-pay | Admitting: Family Medicine

## 2015-05-19 MED ORDER — CEPHALEXIN 500 MG PO CAPS: 500.0000 mg | ORAL_CAPSULE | Freq: Three times a day (TID) | ORAL | Status: DC

## 2015-05-19 MED ORDER — FLUCONAZOLE 150 MG PO TABS: 150.0000 mg | ORAL_TABLET | Freq: Once | ORAL | Status: DC

## 2015-05-19 NOTE — Telephone Encounter (Signed)
Patients son Bethann Berkshire calling to say that they need a refill on the cephalexin if possible, she was previously prescribed this, but did not take as directed and now does not have enough to treat the uti, and also is asking for something for yeast infection too   [facility] [place]  [phone]

## 2015-05-19 NOTE — Telephone Encounter (Signed)
Medication called/sent to requested pharmacy and pt's son aware 

## 2015-05-19 NOTE — Telephone Encounter (Signed)
Keflex 500 tid for 7 days.  Diflucan 150 po x 1.

## 2015-05-23 ENCOUNTER — Other Ambulatory Visit: Payer: Self-pay | Admitting: Family Medicine

## 2015-06-06 ENCOUNTER — Telehealth: Payer: Self-pay | Admitting: Family Medicine

## 2015-06-06 MED ORDER — ALPRAZOLAM 0.5 MG PO TABS: 0.5000 mg | ORAL_TABLET | Freq: Every evening | ORAL | Status: DC | PRN

## 2015-06-06 MED ORDER — QUETIAPINE FUMARATE 25 MG PO TABS: ORAL_TABLET | ORAL | Status: DC

## 2015-06-06 NOTE — Telephone Encounter (Signed)
Medication called/sent to requested pharmacy  

## 2015-06-06 NOTE — Telephone Encounter (Signed)
?   OK to Refill  

## 2015-06-06 NOTE — Telephone Encounter (Signed)
ok 

## 2015-06-06 NOTE — Telephone Encounter (Signed)
°  [facility] [address]. Patient requesting refills on both of these medications. QUEtiapine (SEROQUEL) 25 MG tablet ALPRAZolam (XANAX) 0.5 MG tablet

## 2015-06-12 ENCOUNTER — Ambulatory Visit (INDEPENDENT_AMBULATORY_CARE_PROVIDER_SITE_OTHER): Payer: Medicare Other | Admitting: Family Medicine

## 2015-06-12 ENCOUNTER — Encounter: Payer: Self-pay | Admitting: Family Medicine

## 2015-06-12 VITALS — BP 110/62 | HR 72 | Temp 98.0°F | Resp 18 | Ht 61.0 in

## 2015-06-12 DIAGNOSIS — F0391 Unspecified dementia with behavioral disturbance: Secondary | ICD-10-CM

## 2015-06-12 DIAGNOSIS — F03918 Unspecified dementia, unspecified severity, with other behavioral disturbance: Secondary | ICD-10-CM

## 2015-06-12 DIAGNOSIS — I4891 Unspecified atrial fibrillation: Secondary | ICD-10-CM | POA: Diagnosis not present

## 2015-06-12 DIAGNOSIS — R7881 Bacteremia: Secondary | ICD-10-CM

## 2015-06-12 LAB — URINALYSIS, ROUTINE W REFLEX MICROSCOPIC
Bilirubin Urine: NEGATIVE
Glucose, UA: NEGATIVE mg/dL
HGB URINE DIPSTICK: NEGATIVE
KETONES UR: NEGATIVE mg/dL
Nitrite: NEGATIVE
Protein, ur: NEGATIVE mg/dL
SPECIFIC GRAVITY, URINE: 1.01 (ref 1.005–1.030)
Urobilinogen, UA: 0.2 mg/dL (ref 0.0–1.0)
pH: 6 (ref 5.0–8.0)

## 2015-06-12 LAB — URINALYSIS, MICROSCOPIC ONLY
CASTS: NONE SEEN
CRYSTALS: NONE SEEN
RBC / HPF: NONE SEEN RBC/hpf (ref <3)

## 2015-06-12 LAB — PT WITH INR/FINGERSTICK
INR FINGERSTICK: 2.3 — AB (ref 0.80–1.20)
PT, fingerstick: 28.2 seconds — ABNORMAL HIGH (ref 10.4–12.5)

## 2015-06-12 MED ORDER — SENNOSIDES-DOCUSATE SODIUM 8.6-50 MG PO TABS: 2.0000 | ORAL_TABLET | Freq: Every evening | ORAL | Status: DC | PRN

## 2015-06-12 MED ORDER — RISPERIDONE 0.5 MG PO TABS: 0.5000 mg | ORAL_TABLET | Freq: Two times a day (BID) | ORAL | Status: DC

## 2015-06-12 NOTE — Progress Notes (Signed)
Subjective:    Patient ID: Heather [name], female    DOB: 20-May-1933, 81 y.o.   MRN: [id number]  HPI 03/27/15 Patient has severe end-stage dementia. She is currently on Coumadin 2 mg per day  for atrial fibrillation. INR today is borderline at 1.9. She has not been drinking as much fluid recently due to her end-stage dementia. Her by mouth intake is diminished. She appears slightly dehydrated today.  At that time, my plan was: Continue Coumadin at its current dose and recheck in 4 weeks. I did recommend that they discontinue the Lasix and only use it as needed. I instructed [name] to always use the potassium on days when they use the Lasix. I am concerned the patient is getting dehydrated due to her poor oral intake secondary to her dementia.  05/02/15 Patient is here today for recheck of her INR. She is sleeping better now since the addition of Seroquel as well as changing her diltiazem from delayed release which she was inadvertently chewing due to dementia to an immediate release that she takes 90 mg potid.  However she is battling constipation. She is taking MiraLAX on a daily basis which is not helping. This also contributes to urinary retention. Her INR today is subtherapeutic at 1.6.  At that time, my plan was: Patient is overdue for regular annual lab work including a CBC to monitor for anemia on her chronic anticoagulation as well as a CMP to monitor her kidney and liver function given the medication she is taking. We'll check that today.  Increase Coumadin to 3 mg on Wednesday and Sunday and 2 mg on all other days. Begin Linzess 145 mcg poqday for chronic constipation. Hopefully this will help with some of the delirium she gets when she is constipated and also help treat some urinary retention she is experiencing. Recheck INR in one month. I refilled her Seroquel. She is taking anywhere from 2-4 tablets at night depending on how delirious she is and how much sundowning she is  experiencing.  06/12/15  Dementia and subsequent delirium is getting worse.  Today, she screams and is agitated just taking her blood pressure.  She screams when we check her INR.  She moans constantly throughout our encounter.  INR is 2.3 and therapeutic. Family is concerned she may have another UTI.  Patient states combative like this throughout the day all throughout the week. She only sleeps soundly 1 night a week. The Seroquel and Xanax do not seem to be helping Past Medical History  Diagnosis Date  . Hyperlipidemia   . Chronic LBP   . Gout 01/2012    right index and middle fingers  . Dysrhythmia     atrial fib  . Dementia   . Scoliosis   . Hypertension     under control  . Full dentures   . Wears glasses   . Pacemaker     dr croitoru-[facility]  . Arthritis   . GERD (gastroesophageal reflux disease)   . Atrial fibrillation   . Hiatal hernia   . Seizures 2009    seizure x 1; none since pacemaker insertion  . Renal mass    Past Surgical History  Procedure Laterality Date  . Tonsillectomy    . Hernia repair    . Trigger finger release  04/24/2011    release A-1 pulley left index  . Carpal tunnel release  11/14/2009    right  . Appendectomy    . Abdominal hysterectomy    .  Heel spur surgery      both feet  . Eye surgery      both cataracts  . Permanent pacemaker insertion  07/08/2008    [employer]  . Shoulder arthroscopy  04/15/2006 - left    right:  10/18/2005, 09/24/2005  . Lumbar laminectomy/decompression microdiscectomy  02/06/2005; 10/21/2003    with fusions   . Back surgery  [id number],2005  . Cardioversion  08/08/2010    for atrial fib.  . Trigger finger release  08/10/2003    right thumb  . Wrist arthroscopy  07/04/2010    left wrist; also left CTR  . Spinal growth rods    . Knee arthroscopy      right  . Trigger finger release  01/21/2012    Procedure: RELEASE TRIGGER FINGER/A-1 PULLEY;  Surgeon: Nicki ReaperGary R Kuzma, MD;  Location: [facility];  Service: Orthopedics;   Laterality: Right;  right ring finger  . I&d extremity  02/05/2012    Procedure: IRRIGATION AND DEBRIDEMENT EXTREMITY;  Surgeon: Nicki ReaperGary R Kuzma, MD;  Location: [facility];  Service: Orthopedics;  Laterality: Right;  debridement right index and middle fingers  . Trigger finger release Left 03/17/2013    Procedure: RELEASE TRIGGER FINGER/A-1 PULLEY LEFT MIDDLE, RING AND SMALL FINGERS;  Surgeon: Nicki ReaperGary R Kuzma, MD;  Location: [facility];  Service: Orthopedics;  Laterality: Left;  . Nm myocar perf wall motion  11/24/2007    No ischemia   Current Outpatient Prescriptions on File Prior to Visit  Medication Sig Dispense Refill  . ALPRAZolam (XANAX) 0.5 MG tablet Take 1 tablet (0.5 mg total) by mouth at bedtime as needed. 30 tablet 1  . alum hydroxide-mag trisilicate (GAVISCON) 80-20 MG CHEW chewable tablet Chew 2 tablets by mouth as needed for indigestion or heartburn.    . cephALEXin (KEFLEX) 500 MG capsule Take 1 capsule (500 mg total) by mouth 3 (three) times daily. 21 capsule 0  . colchicine 0.6 MG tablet Take 1 tablet (0.6 mg total) by mouth daily. 30 tablet 0  . diltiazem (CARDIZEM) 90 MG tablet Take 1 tablet (90 mg total) by mouth every 8 (eight) hours. 90 tablet 3  . esomeprazole (NEXIUM) 40 MG capsule TAKE 1 CAPSULE BY MOUTH DAILY BEFORE BREAKFAST. 90 capsule 3  . fish oil-omega-3 fatty acids 1000 MG capsule Take 2 g by mouth daily.    . furosemide (LASIX) 40 MG tablet TAKE 1 TABLET BY MOUTH DAILY AS NEEDED FOR LEG SWELLING 30 tablet 5  . losartan (COZAAR) 100 MG tablet TAKE 1 TABLET DAILY 90 tablet 3  . memantine (NAMENDA) 10 MG tablet TAKE 1 TABLET BY MOUTH TWICE A DAY 180 tablet 3  . potassium chloride SA (K-DUR,KLOR-CON) 20 MEQ tablet Take 20 mEq by mouth as needed.    Marland Kitchen. QUEtiapine (SEROQUEL) 25 MG tablet TAKE 4 TABLETS BY MOUTH 30 MINS BEFORE BEDTIME AS NEEDED FOR SLEEP OR AGITATION 120 tablet 5  . ranitidine (ZANTAC) 150 MG tablet TAKE 1 TABLET BY MOUTH AT  BEDTIME. 30 tablet 5  . sotalol (BETAPACE) 80 MG tablet TAKE 1 TABLET BY MOUTH TWICE A DAY 180 tablet 3   No current facility-administered medications on file prior to visit.   Allergies  Allergen Reactions  . Oxycodone Hcl Other (See Comments)    HALLUCINATIONS  . Amiodarone     Loss of appetite  . Benazepril Cough  . Prednisone Other (See Comments)    Hallucinations  . Aspirin Other (See Comments)  GI UPSET  . Codeine Other (See Comments)    GI UPSET  . Sulfa Antibiotics Other (See Comments)    GI UPSET   History   Social History  . Marital Status: Married    Spouse Name: N/A  . Number of Children: N/A  . Years of Education: N/A   Occupational History  . Not on file.   Social History Main Topics  . Smoking status: Never Smoker   . Smokeless tobacco: Never Used  . Alcohol Use: No  . Drug Use: No  . Sexual Activity: Not on file   Other Topics Concern  . Not on file   Social History Narrative      Review of Systems  All other systems reviewed and are negative.      Objective:   Physical Exam  Constitutional: She appears well-developed.  Cardiovascular: Normal rate and normal heart sounds.   Pulmonary/Chest: Effort normal and breath sounds normal. No respiratory distress. She has no wheezes. She has no rales.  Abdominal: She exhibits no distension. There is no tenderness.  Psychiatric: Her speech is delayed. She is slowed. Cognition and memory are impaired.  Vitals reviewed.         Assessment & Plan:  Atrial fibrillation, unspecified - Plan: PT with INR/Fingerstick, PT with INR/Fingerstick  Dementia with behavioral disturbance - Plan: risperiDONE (RISPERDAL) 0.5 MG tablet  Discontinue Seroquel begin Risperdal 0.5 mg by mouth twice a day increased to 3 times a day and a week if not working. Consider discontinuing Xanax if not effective in switching to Restoril. I also gave the family the materials to collect a urine sample to screen for urinary  tract infection. Recheck in one week

## 2015-06-12 NOTE — Addendum Note (Signed)
Addended by: Alean RinneLONG, Rafeef Lau A on: 06/12/2015 05:19 PM   Modules accepted: Orders

## 2015-06-12 NOTE — Addendum Note (Signed)
Addended by: Alean RinneLONG, Jessicca Stitzer A on: 06/12/2015 04:15 PM   Modules accepted: Orders

## 2015-06-14 ENCOUNTER — Other Ambulatory Visit: Payer: Self-pay | Admitting: Family Medicine

## 2015-06-14 LAB — URINE CULTURE
COLONY COUNT: NO GROWTH
Organism ID, Bacteria: NO GROWTH

## 2015-06-14 MED ORDER — FLUCONAZOLE 150 MG PO TABS: 150.0000 mg | ORAL_TABLET | Freq: Once | ORAL | Status: DC

## 2015-06-15 ENCOUNTER — Telehealth: Payer: Self-pay | Admitting: Family Medicine

## 2015-06-15 IMAGING — CR DG ABDOMEN 2V
3 series · 3 of 3 positions shown · non-contrast
Comparison: 09/10/2013.

CLINICAL DATA: Followup small bowel obstruction.

EXAM:
ABDOMEN - 2 VIEW

[t abdomen supine (1 of 2)]
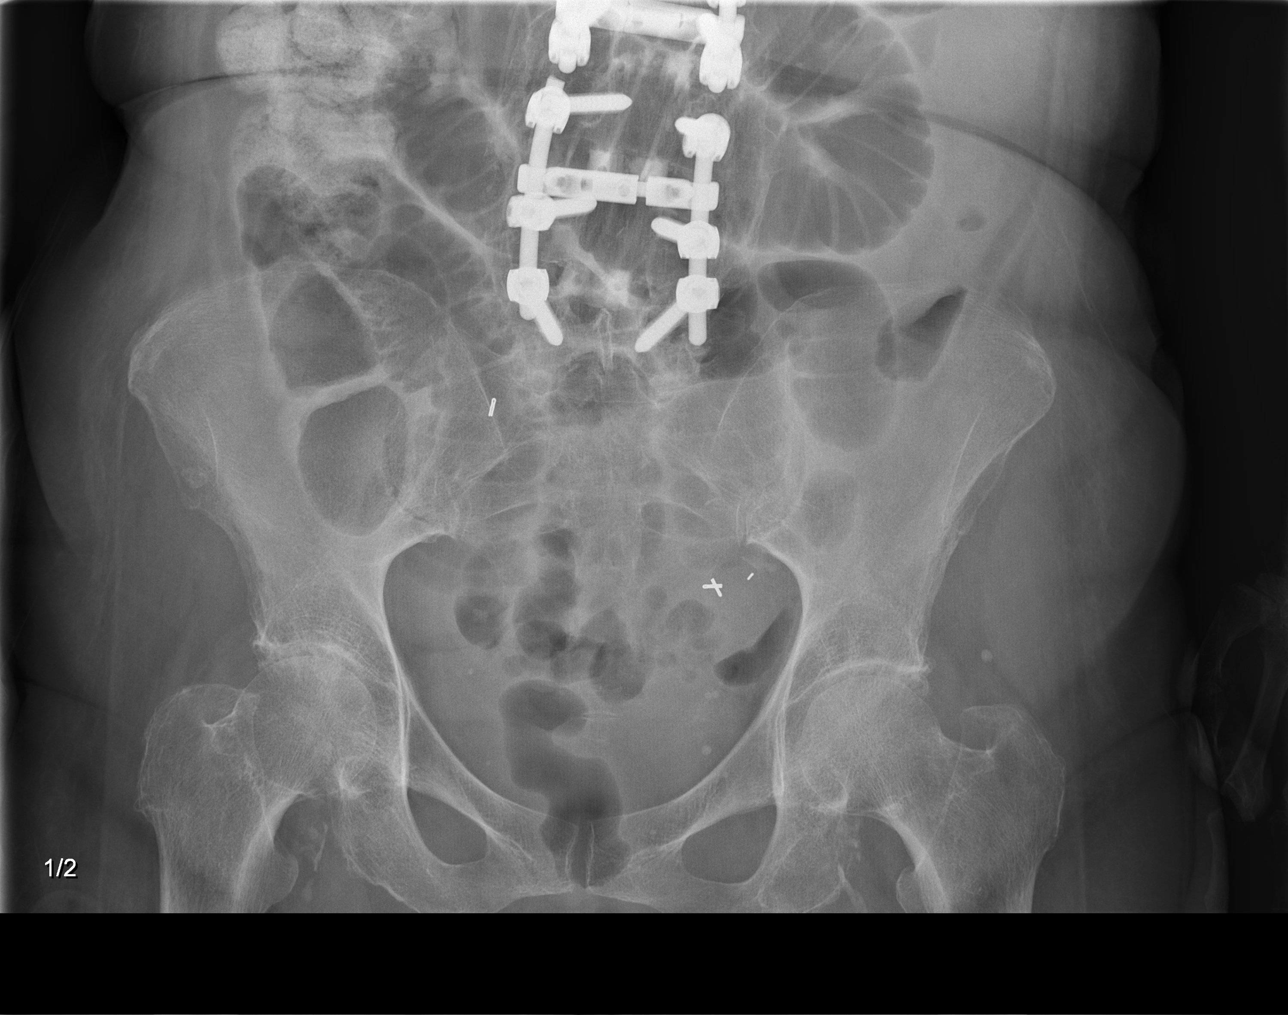

[t abdomen supine (2 of 2)]
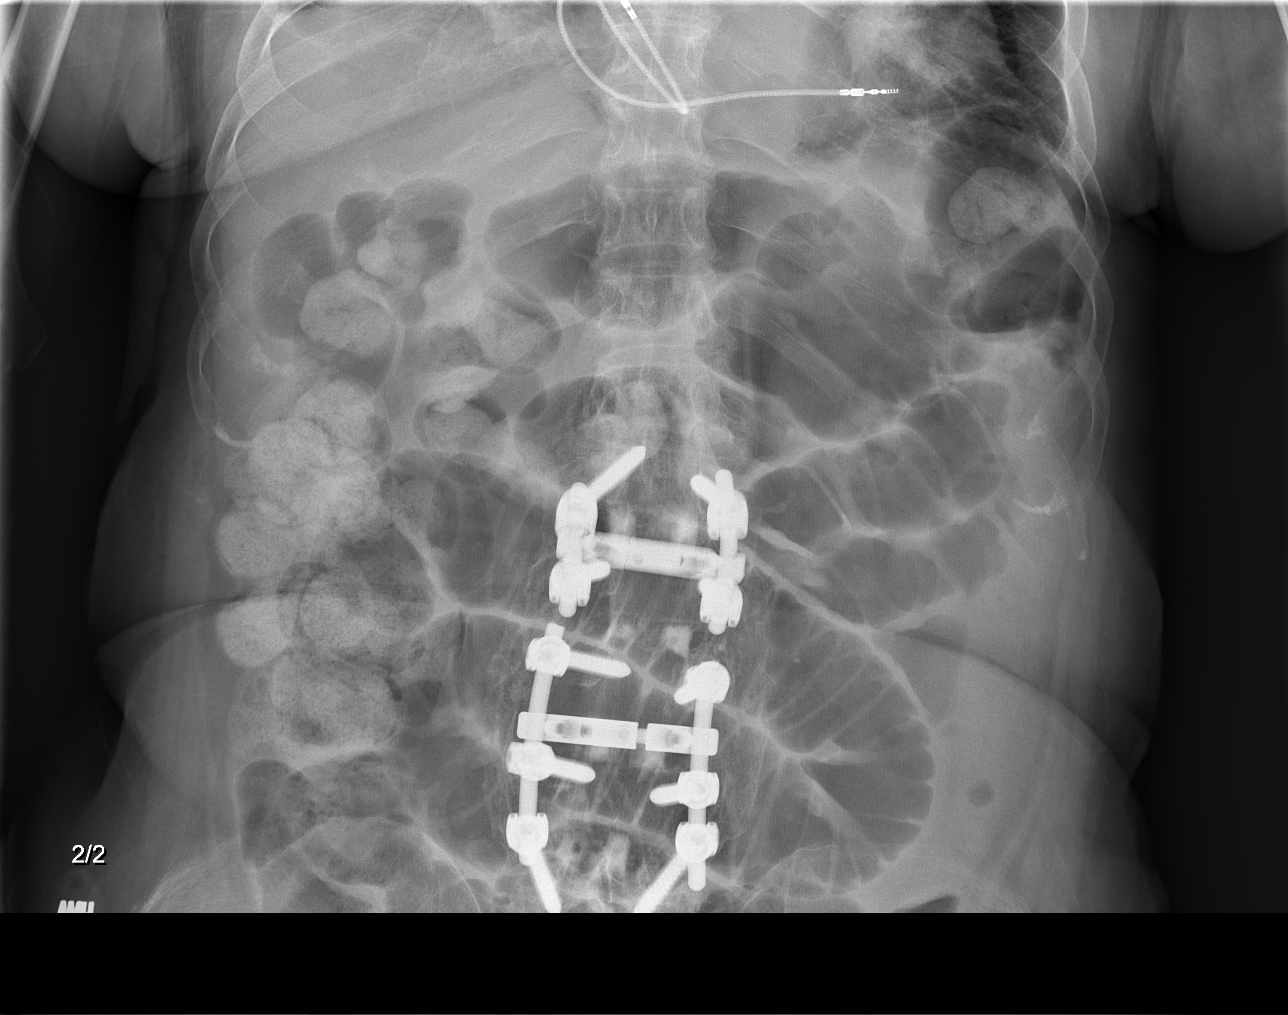

[w abdomen decub]
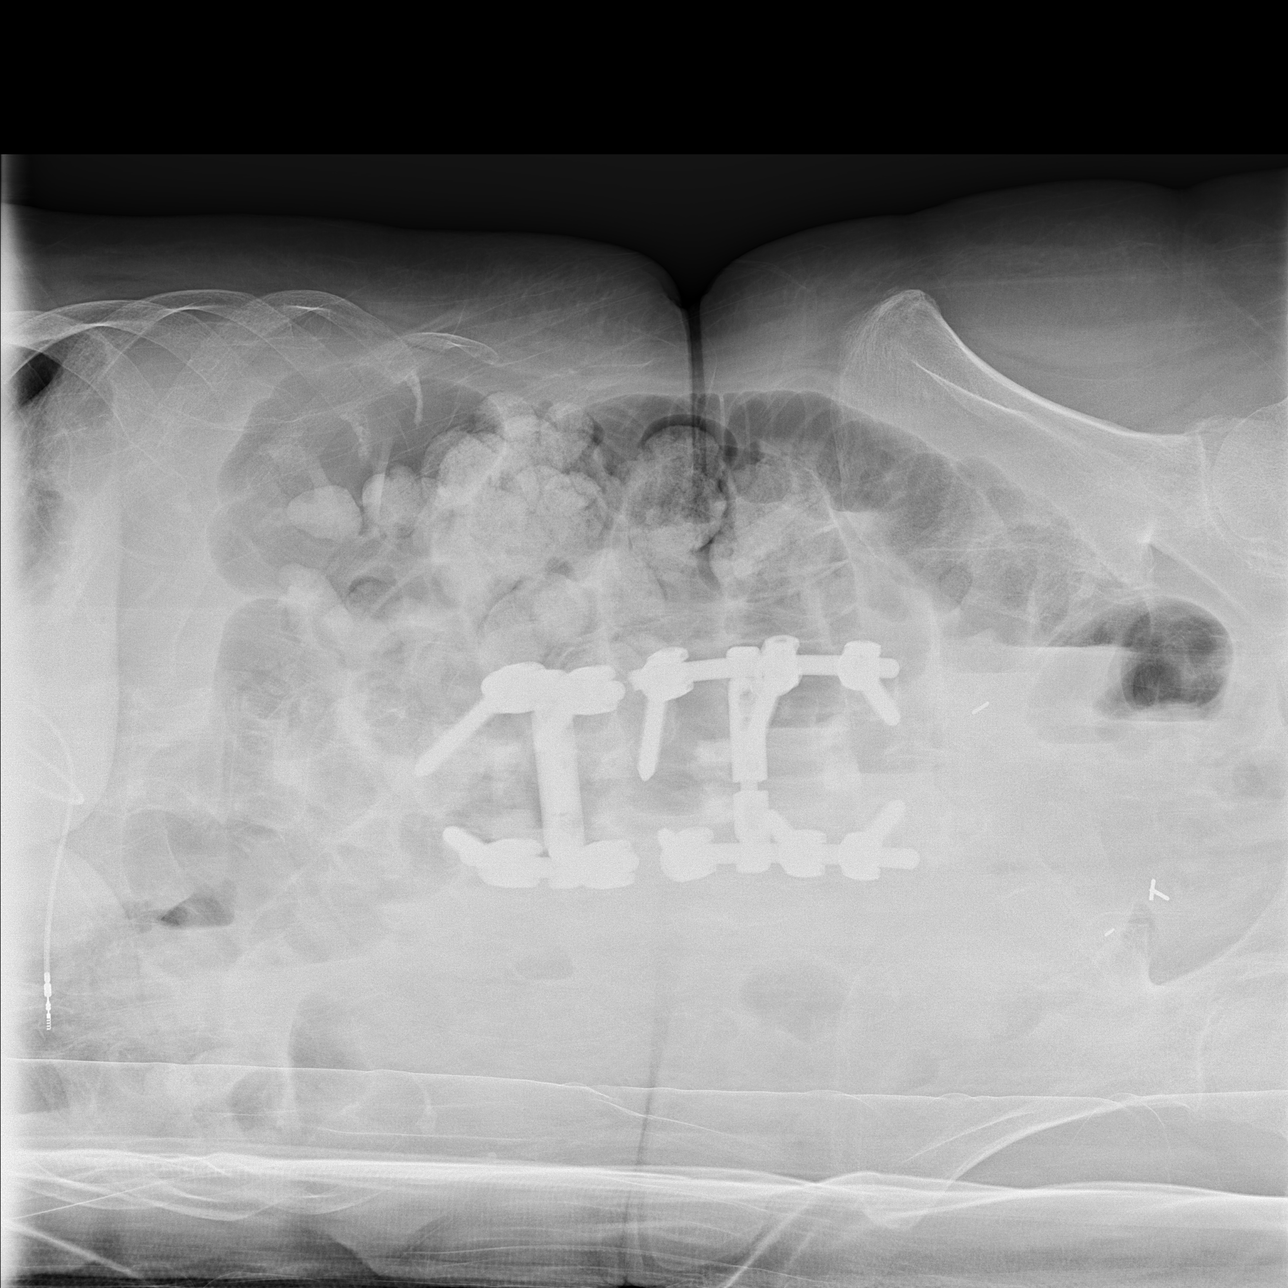

[3 of 3 positions shown; findings below may reference images not displayed]

FINDINGS: Distended loops of small bowel are again noted in the central
abdomen. There is less small bowel distention than there was on the
previous day study. Air-fluid levels are noted on the erect view.
There is stool within a nondistended colon.

No free air.
IMPRESSION: There is less small bowel dilation than on the previous day's study,
suggesting mild improvement in the degree of small bowel
obstruction. The partial small bowel obstruction, persists, however.
No free air.

## 2015-06-15 MED ORDER — TEMAZEPAM 30 MG PO CAPS: 30.0000 mg | ORAL_CAPSULE | Freq: Every evening | ORAL | Status: DC | PRN

## 2015-06-15 NOTE — Telephone Encounter (Signed)
Seroquel d/c'd.  Has started Risperdal was started Tuesday.  She is WIRED up.  Up all night just going and going.  Talking and moving. Up all day and night.  Wants to start Restoril.  They need her to sleep.

## 2015-06-15 NOTE — Telephone Encounter (Signed)
Begin Restoril 30 mg by mouth daily at bedtime. Continue Risperdal 0.5 mg by mouth twice a day

## 2015-06-15 NOTE — Telephone Encounter (Signed)
Spoke to spouse.  Told to keep Ms Heather [name] on Risperdal and we are calling in the sleeping pill for at bedtime.

## 2015-06-19 ENCOUNTER — Telehealth: Payer: Self-pay | Admitting: Family Medicine

## 2015-06-19 NOTE — Telephone Encounter (Signed)
Patients son Bethann Berkshirejohnny calling to say that the medicine for her moods that she was put on has put them in a terrible mess  Please call him at [phone]

## 2015-06-19 NOTE — Telephone Encounter (Signed)
Spoke to Heather [name] and she says that the pt is taking the Respirdal BID and Restoril qhs and she is sleeping every other night and she is acting like a Engineer, agriculturalcaged animal - doing stuff very out of character for her. He was wanting to know what they could do now to help with her mood swings and sleeping issues?

## 2015-06-19 NOTE — Telephone Encounter (Signed)
Spoke with son, dc restoril and try risperdal 0.5 mg potid.

## 2015-06-29 ENCOUNTER — Telehealth: Payer: Self-pay | Admitting: Family Medicine

## 2015-06-29 DIAGNOSIS — F0391 Unspecified dementia with behavioral disturbance: Secondary | ICD-10-CM

## 2015-06-29 DIAGNOSIS — F03918 Unspecified dementia, unspecified severity, with other behavioral disturbance: Secondary | ICD-10-CM

## 2015-06-29 MED ORDER — RISPERIDONE 0.5 MG PO TABS: 0.5000 mg | ORAL_TABLET | Freq: Three times a day (TID) | ORAL | Status: DC

## 2015-06-29 NOTE — Telephone Encounter (Signed)
Patients son Heather [name] calling for refill on her risperidone pharmacy told him to call us  [facility]

## 2015-06-29 NOTE — Telephone Encounter (Signed)
Okay but its now TID instead of BID (90)

## 2015-06-29 NOTE — Telephone Encounter (Signed)
?   OK to Refill  

## 2015-06-29 NOTE — Telephone Encounter (Signed)
Medication called/sent to requested pharmacy  

## 2015-07-13 ENCOUNTER — Encounter: Payer: Self-pay | Admitting: Family Medicine

## 2015-07-13 ENCOUNTER — Ambulatory Visit (INDEPENDENT_AMBULATORY_CARE_PROVIDER_SITE_OTHER): Payer: Medicare Other | Admitting: Family Medicine

## 2015-07-13 VITALS — BP 130/60 | HR 80 | Temp 98.1°F | Resp 16 | Ht 61.0 in

## 2015-07-13 DIAGNOSIS — F0391 Unspecified dementia with behavioral disturbance: Secondary | ICD-10-CM | POA: Diagnosis not present

## 2015-07-13 DIAGNOSIS — F03918 Unspecified dementia, unspecified severity, with other behavioral disturbance: Secondary | ICD-10-CM

## 2015-07-13 DIAGNOSIS — I4891 Unspecified atrial fibrillation: Secondary | ICD-10-CM

## 2015-07-13 LAB — PT WITH INR/FINGERSTICK
INR FINGERSTICK: 3.3 — AB (ref 0.80–1.20)
PT, fingerstick: 39.9 seconds — ABNORMAL HIGH (ref 10.4–12.5)

## 2015-07-13 NOTE — Progress Notes (Signed)
Subjective:    Patient ID: Heather [name], female    DOB: 03-03-1933, 81 y.o.   MRN: [id number]  HPI 03/27/15 Patient has severe end-stage dementia. She is currently on Coumadin 2 mg per day  for atrial fibrillation. INR today is borderline at 1.9. She has not been drinking as much fluid recently due to her end-stage dementia. Her by mouth intake is diminished. She appears slightly dehydrated today.  At that time, my plan was: Continue Coumadin at its current dose and recheck in 4 weeks. I did recommend that they discontinue the Lasix and only use it as needed. I instructed [name] to always use the potassium on days when they use the Lasix. I am concerned the patient is getting dehydrated due to her poor oral intake secondary to her dementia.  05/02/15 Patient is here today for recheck of her INR. She is sleeping better now since the addition of Seroquel as well as changing her diltiazem from delayed release which she was inadvertently chewing due to dementia to an immediate release that she takes 90 mg potid.  However she is battling constipation. She is taking MiraLAX on a daily basis which is not helping. This also contributes to urinary retention. Her INR today is subtherapeutic at 1.6.  At that time, my plan was: Patient is overdue for regular annual lab work including a CBC to monitor for anemia on her chronic anticoagulation as well as a CMP to monitor her kidney and liver function given the medication she is taking. We'll check that today.  Increase Coumadin to 3 mg on Wednesday and Sunday and 2 mg on all other days. Begin Linzess 145 mcg poqday for chronic constipation. Hopefully this will help with some of the delirium she gets when she is constipated and also help treat some urinary retention she is experiencing. Recheck INR in one month. I refilled her Seroquel. She is taking anywhere from 2-4 tablets at night depending on how delirious she is and how much sundowning she is  experiencing.  06/12/15  Dementia and subsequent delirium is getting worse.  Today, she screams and is agitated just taking her blood pressure.  She screams when we check her INR.  She moans constantly throughout our encounter.  INR is 2.3 and therapeutic. Family is concerned she may have another UTI.  Patient states combative like this throughout the day all throughout the week. She only sleeps soundly 1 night a week. The Seroquel and Xanax do not seem to be helping.  AT that time, my plan was: Discontinue Seroquel begin Risperdal 0.5 mg by mouth twice a day increased to 3 times a day and a week if not working. Consider discontinuing Xanax if not effective in switching to Restoril. I also gave the family the materials to collect a urine sample to screen for urinary tract infection. Recheck in one week   07/13/15 Over the last month, the patient has been transitioned to Risperdal 0.5 mg tablets 3 times a day. She is no longer taking Xanax. She is not taking temazepam. She continues to be delirious more often than not. Recently she has become increasingly lethargic. She is not eating. She is not drinking well. Son some noticed decreased urine output. She will only voided once every day. She is not experiencing incontinence. They're concerned her kidneys were shutting down. At times she is having rattling breath sounds during her breathing at night while she is sleeping. They're concerned that she is close to dying. Today on  examination she does appear lethargic however she is easily aroused. Breath sounds are clear. There is no fluid or crackles in her lungs. There is no signs of peripheral edema. She appears very sleepy but nontoxic. Past Medical History  Diagnosis Date  . Hyperlipidemia   . Chronic LBP   . Gout 01/2012    right index and middle fingers  . Dysrhythmia     atrial fib  . Dementia   . Scoliosis   . Hypertension     under control  . Full dentures   . Wears glasses   . Pacemaker      dr croitoru[facility]  . Arthritis   . GERD (gastroesophageal reflux disease)   . Atrial fibrillation   . Hiatal hernia   . Seizures 2009    seizure x 1; none since pacemaker insertion  . Renal mass    Past Surgical History  Procedure Laterality Date  . Tonsillectomy    . Hernia repair    . Trigger finger release  04/24/2011    release A-1 pulley left index  . Carpal tunnel release  11/14/2009    right  . Appendectomy    . Abdominal hysterectomy    . Heel spur surgery      both feet  . Eye surgery      both cataracts  . Permanent pacemaker insertion  07/08/2008    [employer]  . Shoulder arthroscopy  04/15/2006 - left    right:  10/18/2005, 09/24/2005  . Lumbar laminectomy/decompression microdiscectomy  02/06/2005; 10/21/2003    with fusions   . Back surgery  1308,6578  . Cardioversion  08/08/2010    for atrial fib.  . Trigger finger release  08/10/2003    right thumb  . Wrist arthroscopy  07/04/2010    left wrist; also left CTR  . Spinal growth rods    . Knee arthroscopy      right  . Trigger finger release  01/21/2012    Procedure: RELEASE TRIGGER FINGER/A-1 PULLEY;  Surgeon: Nicki Reaper, MD;  Location: [facility];  Service: Orthopedics;  Laterality: Right;  right ring finger  . I&d extremity  02/05/2012    Procedure: IRRIGATION AND DEBRIDEMENT EXTREMITY;  Surgeon: Nicki Reaper, MD;  Location: [facility];  Service: Orthopedics;  Laterality: Right;  debridement right index and middle fingers  . Trigger finger release Left 03/17/2013    Procedure: RELEASE TRIGGER FINGER/A-1 PULLEY LEFT MIDDLE, RING AND SMALL FINGERS;  Surgeon: Nicki Reaper, MD;  Location: [facility];  Service: Orthopedics;  Laterality: Left;  . Nm myocar perf wall motion  11/24/2007    No ischemia   Current Outpatient Prescriptions on File Prior to Visit  Medication Sig Dispense Refill  . alum hydroxide-mag trisilicate (GAVISCON) 80-20 MG CHEW chewable tablet Chew 2 tablets by  mouth as needed for indigestion or heartburn.    . colchicine 0.6 MG tablet Take 1 tablet (0.6 mg total) by mouth daily. 30 tablet 0  . diltiazem (CARDIZEM) 90 MG tablet Take 1 tablet (90 mg total) by mouth every 8 (eight) hours. 90 tablet 3  . esomeprazole (NEXIUM) 40 MG capsule TAKE 1 CAPSULE BY MOUTH DAILY BEFORE BREAKFAST. 90 capsule 3  . fish oil-omega-3 fatty acids 1000 MG capsule Take 2 g by mouth daily.    . fluconazole (DIFLUCAN) 150 MG tablet Take 1 tablet (150 mg total) by mouth once. 1 tablet 0  . furosemide (LASIX) 40 MG tablet TAKE  1 TABLET BY MOUTH DAILY AS NEEDED FOR LEG SWELLING 30 tablet 5  . losartan (COZAAR) 100 MG tablet TAKE 1 TABLET DAILY 90 tablet 3  . memantine (NAMENDA) 10 MG tablet TAKE 1 TABLET BY MOUTH TWICE A DAY 180 tablet 3  . potassium chloride SA (K-DUR,KLOR-CON) 20 MEQ tablet Take 20 mEq by mouth as needed.    . ranitidine (ZANTAC) 150 MG tablet TAKE 1 TABLET BY MOUTH AT BEDTIME. 30 tablet 5  . risperiDONE (RISPERDAL) 0.5 MG tablet Take 1 tablet (0.5 mg total) by mouth 3 (three) times daily. 90 tablet 3  . senna-docusate (SENOKOT S) 8.6-50 MG per tablet Take 2 tablets by mouth at bedtime as needed for mild constipation. 60 tablet 1  . sotalol (BETAPACE) 80 MG tablet TAKE 1 TABLET BY MOUTH TWICE A DAY 180 tablet 3  . temazepam (RESTORIL) 30 MG capsule Take 1 capsule (30 mg total) by mouth at bedtime as needed for sleep. 30 capsule 0   No current facility-administered medications on file prior to visit.   Allergies  Allergen Reactions  . Oxycodone Hcl Other (See Comments)    HALLUCINATIONS  . Amiodarone     Loss of appetite  . Benazepril Cough  . Prednisone Other (See Comments)    Hallucinations  . Aspirin Other (See Comments)    GI UPSET  . Codeine Other (See Comments)    GI UPSET  . Sulfa Antibiotics Other (See Comments)    GI UPSET   Social History   Social History  . Marital Status: Married    Spouse Name: N/A  . Number of Children: N/A  .  Years of Education: N/A   Occupational History  . Not on file.   Social History Main Topics  . Smoking status: Never Smoker   . Smokeless tobacco: Never Used  . Alcohol Use: No  . Drug Use: No  . Sexual Activity: Not on file   Other Topics Concern  . Not on file   Social History Narrative      Review of Systems  All other systems reviewed and are negative.      Objective:   Physical Exam  Constitutional: She appears well-developed.  Cardiovascular: Normal rate and normal heart sounds.   Pulmonary/Chest: Effort normal and breath sounds normal. No respiratory distress. She has no wheezes. She has no rales.  Abdominal: She exhibits no distension. There is no tenderness.  Psychiatric: Her speech is delayed. She is slowed. Cognition and memory are impaired.  Vitals reviewed.         Assessment & Plan:  Dementia with behavioral disturbance - Plan: Ambulatory referral to Hospice  Atrial fibrillation, unspecified - Plan: PT with INR/Fingerstick, COMPLETE METABOLIC PANEL WITH GFR  I am concerned that her dementia has reached end-stage. Given her decreased by mouth intake, poor appetite, her decreased urine output, I am concerned that she may be close to dying within the next 6 months. Family needs significant assistance at home. Therefore they have asked me to consult hospice. I will schedule the consultation. I do believe she may be overmedicated with Risperdal and therefore I've asked [name] to decrease the Risperdal to one half a tablet by mouth 3 times a day. I will also check renal function to rule out signs of kidney failure and dehydration.

## 2015-07-14 ENCOUNTER — Telehealth: Payer: Self-pay | Admitting: Family Medicine

## 2015-07-14 LAB — COMPLETE METABOLIC PANEL WITH GFR
ALBUMIN: 3.8 g/dL (ref 3.6–5.1)
ALK PHOS: 71 U/L (ref 33–130)
ALT: 10 U/L (ref 6–29)
AST: 15 U/L (ref 10–35)
BUN: 30 mg/dL — ABNORMAL HIGH (ref 7–25)
CO2: 28 mmol/L (ref 20–31)
Calcium: 8.8 mg/dL (ref 8.6–10.4)
Chloride: 106 mmol/L (ref 98–110)
Creat: 1.11 mg/dL — ABNORMAL HIGH (ref 0.60–0.93)
GFR, Est African American: 55 mL/min — ABNORMAL LOW (ref >=60)
GFR, Est Non African American: 47 mL/min — ABNORMAL LOW (ref >=60)
Glucose, Bld: 86 mg/dL (ref 70–99)
POTASSIUM: 4.1 mmol/L (ref 3.5–5.3)
SODIUM: 143 mmol/L (ref 135–146)
TOTAL PROTEIN: 6 g/dL — AB (ref 6.1–8.1)
Total Bilirubin: 0.3 mg/dL (ref 0.2–1.2)

## 2015-07-14 NOTE — Telephone Encounter (Signed)
LMOVM WTP out of office and will get a call on monday

## 2015-07-14 NOTE — Telephone Encounter (Signed)
Son Bethann Berkshire calling about labs that were done  [phone]

## 2015-07-17 NOTE — Telephone Encounter (Signed)
Patient's son aware of results.

## 2015-07-24 ENCOUNTER — Ambulatory Visit (INDEPENDENT_AMBULATORY_CARE_PROVIDER_SITE_OTHER): Payer: Medicare Other | Admitting: Family Medicine

## 2015-07-24 ENCOUNTER — Encounter: Payer: Self-pay | Admitting: Family Medicine

## 2015-07-24 VITALS — BP 100/60 | HR 60 | Temp 98.3°F | Resp 14 | Ht 61.0 in | Wt 125.0 lb

## 2015-07-24 DIAGNOSIS — F0391 Unspecified dementia with behavioral disturbance: Secondary | ICD-10-CM

## 2015-07-24 DIAGNOSIS — I4891 Unspecified atrial fibrillation: Secondary | ICD-10-CM | POA: Diagnosis not present

## 2015-07-24 DIAGNOSIS — F03918 Unspecified dementia, unspecified severity, with other behavioral disturbance: Secondary | ICD-10-CM

## 2015-07-24 LAB — PT WITH INR/FINGERSTICK
INR, fingerstick: 3.2 — ABNORMAL HIGH (ref 0.80–1.20)
PT, fingerstick: 38.7 seconds — ABNORMAL HIGH (ref 10.4–12.5)

## 2015-07-24 NOTE — Progress Notes (Signed)
Subjective:    Patient ID: Heather [name], female    DOB: 03-03-1933, 81 y.o.   MRN: [id number]  HPI 03/27/15 Patient has severe end-stage dementia. She is currently on Coumadin 2 mg per day  for atrial fibrillation. INR today is borderline at 1.9. She has not been drinking as much fluid recently due to her end-stage dementia. Her by mouth intake is diminished. She appears slightly dehydrated today.  At that time, my plan was: Continue Coumadin at its current dose and recheck in 4 weeks. I did recommend that they discontinue the Lasix and only use it as needed. I instructed [name] to always use the potassium on days when they use the Lasix. I am concerned the patient is getting dehydrated due to her poor oral intake secondary to her dementia.  05/02/15 Patient is here today for recheck of her INR. She is sleeping better now since the addition of Seroquel as well as changing her diltiazem from delayed release which she was inadvertently chewing due to dementia to an immediate release that she takes 90 mg potid.  However she is battling constipation. She is taking MiraLAX on a daily basis which is not helping. This also contributes to urinary retention. Her INR today is subtherapeutic at 1.6.  At that time, my plan was: Patient is overdue for regular annual lab work including a CBC to monitor for anemia on her chronic anticoagulation as well as a CMP to monitor her kidney and liver function given the medication she is taking. We'll check that today.  Increase Coumadin to 3 mg on Wednesday and Sunday and 2 mg on all other days. Begin Linzess 145 mcg poqday for chronic constipation. Hopefully this will help with some of the delirium she gets when she is constipated and also help treat some urinary retention she is experiencing. Recheck INR in one month. I refilled her Seroquel. She is taking anywhere from 2-4 tablets at night depending on how delirious she is and how much sundowning she is  experiencing.  06/12/15  Dementia and subsequent delirium is getting worse.  Today, she screams and is agitated just taking her blood pressure.  She screams when we check her INR.  She moans constantly throughout our encounter.  INR is 2.3 and therapeutic. Family is concerned she may have another UTI.  Patient states combative like this throughout the day all throughout the week. She only sleeps soundly 1 night a week. The Seroquel and Xanax do not seem to be helping.  AT that time, my plan was: Discontinue Seroquel begin Risperdal 0.5 mg by mouth twice a day increased to 3 times a day and a week if not working. Consider discontinuing Xanax if not effective in switching to Restoril. I also gave the family the materials to collect a urine sample to screen for urinary tract infection. Recheck in one week   07/13/15 Over the last month, the patient has been transitioned to Risperdal 0.5 mg tablets 3 times a day. She is no longer taking Xanax. She is not taking temazepam. She continues to be delirious more often than not. Recently she has become increasingly lethargic. She is not eating. She is not drinking well. Son some noticed decreased urine output. She will only voided once every day. She is not experiencing incontinence. They're concerned her kidneys were shutting down. At times she is having rattling breath sounds during her breathing at night while she is sleeping. They're concerned that she is close to dying. Today on  examination she does appear lethargic however she is easily aroused. Breath sounds are clear. There is no fluid or crackles in her lungs. There is no signs of peripheral edema. She appears very sleepy but nontoxic.  At that time, my plan was: I am concerned that her dementia has reached end-stage. Given her decreased by mouth intake, poor appetite, her decreased urine output, I am concerned that she may be close to dying within the next 6 months. Family needs significant assistance at  home. Therefore they have asked me to consult hospice. I will schedule the consultation. I do believe she may be overmedicated with Risperdal and therefore I've asked [name] to decrease the Risperdal to one half a tablet by mouth 3 times a day. I will also check renal function to rule out signs of kidney failure and dehydration.  07/24/15 Patient is here today for follow-up. Her INR is supratherapeutic at 3.2. However her level of consciousness has improved since we decreased the dose of Risperdal to 0.25 mg by mouth 3 times a day. This dose seems to be working well for the patient and she is also not having as much sundowning and delirium. The family continues to complain of constipation  Past Medical History  Diagnosis Date  . Hyperlipidemia   . Chronic LBP   . Gout 01/2012    right index and middle fingers  . Dysrhythmia     atrial fib  . Dementia   . Scoliosis   . Hypertension     under control  . Full dentures   . Wears glasses   . Pacemaker     dr croitoru-[facility]  . Arthritis   . GERD (gastroesophageal reflux disease)   . Atrial fibrillation   . Hiatal hernia   . Seizures 2009    seizure x 1; none since pacemaker insertion  . Renal mass    Past Surgical History  Procedure Laterality Date  . Tonsillectomy    . Hernia repair    . Trigger finger release  04/24/2011    release A-1 pulley left index  . Carpal tunnel release  11/14/2009    right  . Appendectomy    . Abdominal hysterectomy    . Heel spur surgery      both feet  . Eye surgery      both cataracts  . Permanent pacemaker insertion  07/08/2008    [employer]  . Shoulder arthroscopy  04/15/2006 - left    right:  10/18/2005, 09/24/2005  . Lumbar laminectomy/decompression microdiscectomy  02/06/2005; 10/21/2003    with fusions   . Back surgery  [id number]  . Cardioversion  08/08/2010    for atrial fib.  . Trigger finger release  08/10/2003    right thumb  . Wrist arthroscopy  07/04/2010    left wrist; also left CTR  . Spinal  growth rods    . Knee arthroscopy      right  . Trigger finger release  01/21/2012    Procedure: RELEASE TRIGGER FINGER/A-1 PULLEY;  Surgeon: Nicki Reaper, MD;  Location: [facility];  Service: Orthopedics;  Laterality: Right;  right ring finger  . I&d extremity  02/05/2012    Procedure: IRRIGATION AND DEBRIDEMENT EXTREMITY;  Surgeon: Nicki Reaper, MD;  Location: [facility];  Service: Orthopedics;  Laterality: Right;  debridement right index and middle fingers  . Trigger finger release Left 03/17/2013    Procedure: RELEASE TRIGGER FINGER/A-1 PULLEY LEFT MIDDLE, RING AND SMALL FINGERS;  Surgeon: Nicki Reaper, MD;  Location: [facility];  Service: Orthopedics;  Laterality: Left;  . Nm myocar perf wall motion  11/24/2007    No ischemia   Current Outpatient Prescriptions on File Prior to Visit  Medication Sig Dispense Refill  . alum hydroxide-mag trisilicate (GAVISCON) 80-20 MG CHEW chewable tablet Chew 2 tablets by mouth as needed for indigestion or heartburn.    . colchicine 0.6 MG tablet Take 1 tablet (0.6 mg total) by mouth daily. 30 tablet 0  . diltiazem (CARDIZEM) 90 MG tablet Take 1 tablet (90 mg total) by mouth every 8 (eight) hours. 90 tablet 3  . esomeprazole (NEXIUM) 40 MG capsule TAKE 1 CAPSULE BY MOUTH DAILY BEFORE BREAKFAST. 90 capsule 3  . fish oil-omega-3 fatty acids 1000 MG capsule Take 2 g by mouth daily.    . fluconazole (DIFLUCAN) 150 MG tablet Take 1 tablet (150 mg total) by mouth once. 1 tablet 0  . furosemide (LASIX) 40 MG tablet TAKE 1 TABLET BY MOUTH DAILY AS NEEDED FOR LEG SWELLING 30 tablet 5  . losartan (COZAAR) 100 MG tablet TAKE 1 TABLET DAILY 90 tablet 3  . memantine (NAMENDA) 10 MG tablet TAKE 1 TABLET BY MOUTH TWICE A DAY 180 tablet 3  . potassium chloride SA (K-DUR,KLOR-CON) 20 MEQ tablet Take 20 mEq by mouth as needed.    . ranitidine (ZANTAC) 150 MG tablet TAKE 1 TABLET BY MOUTH AT BEDTIME. 30 tablet 5  . risperiDONE  (RISPERDAL) 0.5 MG tablet Take 1 tablet (0.5 mg total) by mouth 3 (three) times daily. 90 tablet 3  . senna-docusate (SENOKOT S) 8.6-50 MG per tablet Take 2 tablets by mouth at bedtime as needed for mild constipation. 60 tablet 1  . sotalol (BETAPACE) 80 MG tablet TAKE 1 TABLET BY MOUTH TWICE A DAY 180 tablet 3  . temazepam (RESTORIL) 30 MG capsule Take 1 capsule (30 mg total) by mouth at bedtime as needed for sleep. 30 capsule 0  . warfarin (COUMADIN) 1 MG tablet 3 mg on Wednesday and Sunday and 2 mg on all other days.  5   No current facility-administered medications on file prior to visit.   Allergies  Allergen Reactions  . Oxycodone Hcl Other (See Comments)    HALLUCINATIONS  . Amiodarone     Loss of appetite  . Benazepril Cough  . Prednisone Other (See Comments)    Hallucinations  . Aspirin Other (See Comments)    GI UPSET  . Codeine Other (See Comments)    GI UPSET  . Sulfa Antibiotics Other (See Comments)    GI UPSET   Social History   Social History  . Marital Status: Married    Spouse Name: N/A  . Number of Children: N/A  . Years of Education: N/A   Occupational History  . Not on file.   Social History Main Topics  . Smoking status: Never Smoker   . Smokeless tobacco: Never Used  . Alcohol Use: No  . Drug Use: No  . Sexual Activity: Not on file   Other Topics Concern  . Not on file   Social History Narrative      Review of Systems  All other systems reviewed and are negative.      Objective:   Physical Exam  Constitutional: She appears well-developed.  Cardiovascular: Normal rate and normal heart sounds.   Pulmonary/Chest: Effort normal and breath sounds normal. No respiratory distress. She has no wheezes. She has no rales.  Abdominal: She exhibits no distension. There is no tenderness.  Psychiatric: Her speech is delayed. She is slowed. Cognition and memory are impaired.  Vitals reviewed.         Assessment & Plan:  Atrial fibrillation,  unspecified - Plan: PT with INR/Fingerstick  hold Coumadin today. Begin 2 mg of Coumadin every day thereafter. Recheck INR in one month. Begin MiraLAX on a daily basis along with Dulcolax. Use magnesium citrate on an as-needed basis along with a fleets enema on an as-needed basis. Continue Resporal 0.25 mg by mouth 3 times a day

## 2015-07-28 ENCOUNTER — Other Ambulatory Visit: Payer: Self-pay | Admitting: Cardiovascular Disease

## 2015-07-31 NOTE — Telephone Encounter (Signed)
REFILL 

## 2015-08-11 DIAGNOSIS — N183 Chronic kidney disease, stage 3 (moderate): Secondary | ICD-10-CM

## 2015-08-11 DIAGNOSIS — F05 Delirium due to known physiological condition: Secondary | ICD-10-CM

## 2015-08-11 DIAGNOSIS — F0391 Unspecified dementia with behavioral disturbance: Secondary | ICD-10-CM

## 2015-08-11 DIAGNOSIS — I129 Hypertensive chronic kidney disease with stage 1 through stage 4 chronic kidney disease, or unspecified chronic kidney disease: Secondary | ICD-10-CM

## 2015-08-20 ENCOUNTER — Other Ambulatory Visit: Payer: Self-pay | Admitting: Family Medicine

## 2015-08-24 ENCOUNTER — Encounter: Payer: Self-pay | Admitting: Family Medicine

## 2015-08-24 ENCOUNTER — Ambulatory Visit (INDEPENDENT_AMBULATORY_CARE_PROVIDER_SITE_OTHER): Payer: Medicare Other | Admitting: Family Medicine

## 2015-08-24 VITALS — BP 120/60 | HR 84 | Temp 98.4°F | Resp 16 | Ht 61.0 in | Wt 120.0 lb

## 2015-08-24 DIAGNOSIS — I4891 Unspecified atrial fibrillation: Secondary | ICD-10-CM | POA: Diagnosis not present

## 2015-08-24 DIAGNOSIS — Z23 Encounter for immunization: Secondary | ICD-10-CM

## 2015-08-24 LAB — PT WITH INR/FINGERSTICK
INR FINGERSTICK: 3.8 — AB (ref 0.80–1.20)
PT, fingerstick: 45.5 seconds — ABNORMAL HIGH (ref 10.4–12.5)

## 2015-08-24 NOTE — Addendum Note (Signed)
Addended by: Legrand Rams B on: 08/24/2015 10:46 AM   Modules accepted: Orders

## 2015-08-24 NOTE — Progress Notes (Signed)
Subjective:    Patient ID: Heather Heather [name], female    DOB: [birth year], 81 y.o.   MRN: [id number]  HPI 03/27/15 Patient has severe end-stage dementia. She is currently on Coumadin 2 mg per day  for atrial fibrillation. INR today is borderline at 1.9. She has not been drinking as much fluid recently due to her end-stage dementia. Her by mouth intake is diminished. She appears slightly dehydrated today.  At that time, my plan was: Continue Coumadin at its current dose and recheck in 4 weeks. I did recommend that they discontinue the Lasix and only use it as needed. I instructed Heather [name] to always use the potassium on days when they use the Lasix. I am concerned the patient is getting dehydrated due to her poor oral intake secondary to her dementia.  05/02/15 Patient is here today for recheck of her INR. She is sleeping better now since the addition of Seroquel as well as changing her diltiazem from delayed release which she was inadvertently chewing due to dementia to an immediate release that she takes 90 mg potid.  However she is battling constipation. She is taking MiraLAX on a daily basis which is not helping. This also contributes to urinary retention. Her INR today is subtherapeutic at 1.6.  At that time, my plan was: Patient is overdue for regular annual lab work including a CBC to monitor for anemia on her chronic anticoagulation as well as a CMP to monitor her kidney and liver function given the medication she is taking. We'll check that today.  Increase Coumadin to 3 mg on Wednesday and Sunday and 2 mg on all other days. Begin Linzess 145 mcg poqday for chronic constipation. Hopefully this will help with some of the delirium she gets when she is constipated and also help treat some urinary retention she is experiencing. Recheck INR in one month. I refilled her Seroquel. She is taking anywhere from 2-4 tablets at night depending on how delirious she is and how much sundowning she is  experiencing.  06/12/15  Dementia and subsequent delirium is getting worse.  Today, she screams and is agitated just taking her blood pressure.  She screams when we check her INR.  She moans constantly throughout our encounter.  INR is 2.3 and therapeutic. Family is concerned she may have another UTI.  Patient states combative like this throughout the day all throughout the week. She only sleeps soundly 1 night a week. The Seroquel and Xanax do not seem to be helping.  AT that time, my plan was: Discontinue Seroquel begin Risperdal 0.5 mg by mouth twice a day increased to 3 times a day and a week if not working. Consider discontinuing Xanax if not effective in switching to Restoril. I also gave the family the materials to collect a urine sample to screen for urinary tract infection. Recheck in one week   07/13/15 Over the last month, the patient has been transitioned to Risperdal 0.5 mg tablets 3 times a day. She is no longer taking Xanax. She is not taking temazepam. She continues to be delirious more often than not. Recently she has become increasingly lethargic. She is not eating. She is not drinking well. Son some noticed decreased urine output. She will only voided once every day. She is not experiencing incontinence. They're concerned her kidneys were shutting down. At times she is having rattling breath sounds during her breathing at night while she is sleeping. They're concerned that she is close to dying. Today on  examination she does appear lethargic however she is easily aroused. Breath sounds are clear. There is no fluid or crackles in her lungs. There is no signs of peripheral edema. She appears very sleepy but nontoxic.  At that time, my plan was: I am concerned that her dementia has reached end-stage. Given her decreased by mouth intake, poor appetite, her decreased urine output, I am concerned that she may be close to dying within the next 6 months. Family needs significant assistance at  home. Therefore they have asked me to consult hospice. I will schedule the consultation. I do believe she may be overmedicated with Risperdal and therefore I've asked Heather [name] to decrease the Risperdal to one half a tablet by mouth 3 times a day. I will also check renal function to rule out signs of kidney failure and dehydration.  07/24/15 Patient is here today for follow-up. Her INR is supratherapeutic at 3.2. However her level of consciousness has improved since we decreased the dose of Risperdal to 0.25 mg by mouth 3 times a day. This dose seems to be working well for the patient and she is also not having as much sundowning and delirium. The family continues to complain of constipation.  At that time, my plan was:  hold Coumadin today. Begin 2 mg of Coumadin every day thereafter. Recheck INR in one month. Begin MiraLAX on a daily basis along with Dulcolax. Use magnesium citrate on an as-needed basis along with a fleets enema on an as-needed basis. Continue Resporal 0.25 mg by mouth 3 times a day  08/24/15 Thankfully, hospice is taking the patient on and helping the family substantially at home. Today she is here for an INR check in her blood is supratherapeutic at 3.8 furthermore today on her exam I see a pill-rolling tremor in her right hand that I have never noticed before. She certainly has a masklike face season other signs and symptoms of Parkinson-like features. The question is if this is Parkinson's disease versus drug-induced Parkinson syndrome due to the Risperdal. She is having more trouble ambulating. She is pretty much confined to a wheelchair or chair throughout the day. I am concerned about her falling should she become confused at night and try to get out of bed and hitting her head and having an intracranial hemorrhage on the Coumadin. So was the family..  Past Medical History  Diagnosis Date  . Hyperlipidemia   . Chronic LBP   . Gout 01/2012    right index and middle fingers  .  Dysrhythmia     atrial fib  . Dementia   . Scoliosis   . Hypertension     under control  . Full dentures   . Wears glasses   . Pacemaker     dr croitoru-[facility]  . Arthritis   . GERD (gastroesophageal reflux disease)   . Atrial fibrillation   . Hiatal hernia   . Seizures 2009    seizure x 1; none since pacemaker insertion  . Renal mass    Past Surgical History  Procedure Laterality Date  . Tonsillectomy    . Hernia repair    . Trigger finger release  04/24/2011    release A-1 pulley left index  . Carpal tunnel release  11/14/2009    right  . Appendectomy    . Abdominal hysterectomy    . Heel spur surgery      both feet  . Eye surgery      both cataracts  . Permanent pacemaker  insertion  07/08/2008    Medtronic  . Shoulder arthroscopy  04/15/2006 - left    right:  10/18/2005, 09/24/2005  . Lumbar laminectomy/decompression microdiscectomy  02/06/2005; 10/21/2003    with fusions   . Back surgery  1610,9604  . Cardioversion  08/08/2010    for atrial fib.  . Trigger finger release  08/10/2003    right thumb  . Wrist arthroscopy  07/04/2010    left wrist; also left CTR  . Spinal growth rods    . Knee arthroscopy      right  . Trigger finger release  01/21/2012    Procedure: RELEASE TRIGGER FINGER/A-1 PULLEY;  Surgeon: Nicki Reaper, MD;  Location: [facility];  Service: Orthopedics;  Laterality: Right;  right ring finger  . I&d extremity  02/05/2012    Procedure: IRRIGATION AND DEBRIDEMENT EXTREMITY;  Surgeon: Nicki Reaper, MD;  Location: [facility];  Service: Orthopedics;  Laterality: Right;  debridement right index and middle fingers  . Trigger finger release Left 03/17/2013    Procedure: RELEASE TRIGGER FINGER/A-1 PULLEY LEFT MIDDLE, RING AND SMALL FINGERS;  Surgeon: Nicki Reaper, MD;  Location: [facility];  Service: Orthopedics;  Laterality: Left;  . Nm myocar perf wall motion  11/24/2007    No ischemia   Current Outpatient Prescriptions  on File Prior to Visit  Medication Sig Dispense Refill  . alum hydroxide-mag trisilicate (GAVISCON) 80-20 MG CHEW chewable tablet Chew 2 tablets by mouth as needed for indigestion or heartburn.    . colchicine 0.6 MG tablet Take 1 tablet (0.6 mg total) by mouth daily. 30 tablet 0  . diltiazem (CARDIZEM) 90 MG tablet TAKE 1 TABLET BY MOUTH EVERY 8 HOURS 90 tablet 1  . esomeprazole (NEXIUM) 40 MG capsule TAKE 1 CAPSULE BY MOUTH DAILY BEFORE BREAKFAST. 90 capsule 3  . fish oil-omega-3 fatty acids 1000 MG capsule Take 2 g by mouth daily.    . fluconazole (DIFLUCAN) 150 MG tablet Take 1 tablet (150 mg total) by mouth once. 1 tablet 0  . furosemide (LASIX) 40 MG tablet TAKE 1 TABLET BY MOUTH DAILY AS NEEDED FOR LEG SWELLING 30 tablet 5  . losartan (COZAAR) 100 MG tablet TAKE 1 TABLET DAILY 90 tablet 3  . memantine (NAMENDA) 10 MG tablet TAKE 1 TABLET BY MOUTH TWICE A DAY 180 tablet 3  . potassium chloride SA (K-DUR,KLOR-CON) 20 MEQ tablet Take 20 mEq by mouth as needed.    . ranitidine (ZANTAC) 150 MG tablet TAKE 1 TABLET BY MOUTH AT BEDTIME. 30 tablet 5  . risperiDONE (RISPERDAL) 0.5 MG tablet Take 1 tablet (0.5 mg total) by mouth 3 (three) times daily. (Patient taking differently: Take 0.25 mg by mouth 3 (three) times daily. ) 90 tablet 3  . senna-docusate (SENOKOT S) 8.6-50 MG per tablet Take 2 tablets by mouth at bedtime as needed for mild constipation. 60 tablet 1  . sotalol (BETAPACE) 80 MG tablet TAKE 1 TABLET BY MOUTH TWICE A DAY 180 tablet 3  . temazepam (RESTORIL) 30 MG capsule Take 1 capsule (30 mg total) by mouth at bedtime as needed for sleep. 30 capsule 0  . warfarin (COUMADIN) 1 MG tablet 3 mg on Wednesday and Sunday and 2 mg on all other days.  5  . warfarin (COUMADIN) 1 MG tablet TAKE 3 TABS BY MOUTH ON MONDAY AND FRIDAY, AND TAKE 2 TABS ON TUES, WED, THUR, SAT, AND SUN. 90 tablet 5   No current  facility-administered medications on file prior to visit.   Allergies  Allergen  Reactions  . Oxycodone Hcl Other (See Comments)    HALLUCINATIONS  . Amiodarone     Loss of appetite  . Benazepril Cough  . Prednisone Other (See Comments)    Hallucinations  . Aspirin Other (See Comments)    GI UPSET  . Codeine Other (See Comments)    GI UPSET  . Sulfa Antibiotics Other (See Comments)    GI UPSET   Social History   Social History  . Marital Status: Married    Spouse Name: N/A  . Number of Children: N/A  . Years of Education: N/A   Occupational History  . Not on file.   Social History Main Topics  . Smoking status: Never Smoker   . Smokeless tobacco: Never Used  . Alcohol Use: No  . Drug Use: No  . Sexual Activity: Not on file   Other Topics Concern  . Not on file   Social History Narrative      Review of Systems  All other systems reviewed and are negative.      Objective:   Physical Exam  Constitutional: She appears well-developed.  Cardiovascular: Normal rate and normal heart sounds.   Pulmonary/Chest: Effort normal and breath sounds normal. No respiratory distress. She has no wheezes. She has no rales.  Abdominal: She exhibits no distension. There is no tenderness.  Psychiatric: Her speech is delayed. She is slowed. Cognition and memory are impaired.  Vitals reviewed.         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 The family agrees but would like to continue the medication because without it they're unable to care for her at home. If the Parkinson-like features worsen, we may need to stop the Resporal. I believe, weighing to good and the bad, the patient is at high risk on Coumadin for an intracranial hemorrhage or a serious bleed secondary to a fall. Therefore I recommended that we stop Coumadin altogether. I recommended that we replace it with aspirin 325 mg a day to help prevent stroke secondary  to atrial fibrillation.

## 2015-09-07 DIAGNOSIS — N183 Chronic kidney disease, stage 3 (moderate): Secondary | ICD-10-CM

## 2015-09-07 DIAGNOSIS — I129 Hypertensive chronic kidney disease with stage 1 through stage 4 chronic kidney disease, or unspecified chronic kidney disease: Secondary | ICD-10-CM

## 2015-09-07 DIAGNOSIS — F0391 Unspecified dementia with behavioral disturbance: Secondary | ICD-10-CM | POA: Diagnosis not present

## 2015-09-07 DIAGNOSIS — F05 Delirium due to known physiological condition: Secondary | ICD-10-CM

## 2015-09-14 ENCOUNTER — Telehealth: Payer: Self-pay | Admitting: Cardiovascular Disease

## 2015-09-14 NOTE — Telephone Encounter (Signed)
Closed encounter °

## 2015-09-26 ENCOUNTER — Encounter: Payer: Medicare Other | Admitting: Cardiovascular Disease

## 2015-10-20 ENCOUNTER — Encounter: Payer: Self-pay | Admitting: Cardiovascular Disease

## 2015-10-20 ENCOUNTER — Ambulatory Visit (INDEPENDENT_AMBULATORY_CARE_PROVIDER_SITE_OTHER): Payer: Medicare Other | Admitting: Cardiovascular Disease

## 2015-10-20 VITALS — BP 130/78 | HR 94 | Ht 64.0 in

## 2015-10-20 DIAGNOSIS — I455 Other specified heart block: Secondary | ICD-10-CM | POA: Diagnosis not present

## 2015-10-20 DIAGNOSIS — I481 Persistent atrial fibrillation: Secondary | ICD-10-CM

## 2015-10-20 DIAGNOSIS — I10 Essential (primary) hypertension: Secondary | ICD-10-CM

## 2015-10-20 DIAGNOSIS — Z950 Presence of cardiac pacemaker: Secondary | ICD-10-CM | POA: Diagnosis not present

## 2015-10-20 DIAGNOSIS — I4819 Other persistent atrial fibrillation: Secondary | ICD-10-CM

## 2015-10-20 NOTE — Patient Instructions (Signed)
Your physician has recommended you make the following change in your medication:   STOP SOTALOL  Remote monitoring is used to monitor your Pacemaker from home. This monitoring reduces the number of office visits required to check your device to one time per year. It allows us to monitor the functioning of your device to ensure it is working properly. You are scheduled for a device check from home on January 21, 2016. You may send your transmission at any time that day. If you have a wireless device, the transmission will be sent automatically. After your physician reviews your transmission, you will receive a postcard with your next transmission date.  Dr. Royann Shiversroitoru recommends that you schedule a follow-up appointment in: ONE YEAR

## 2015-10-20 NOTE — Progress Notes (Signed)
Patient ID: Heather [name], female   DOB: 1933-04-12, 81 y.o.   MRN: [id number]     Cardiology Office Note   Date:  10/21/2015   ID:  Heather [name], DOB Jul 12, 1933, MRN [id number]  PCP:  Leo Grosser, MD  Cardiologist:   Thurmon Fair, MD   Chief Complaint  Patient presents with  . Follow-up    patient's family reports no known new cardiac problems      History of Present Illness: Heather [name] is a 81 y.o. female who presents for  Follow-up for atrial fibrillation( recurrent, persistent ) and sinus node dysfunction (severe sinus bradycardia, long periods of sinus arrest). Over the last several years she has had slow progression of dementia, but this has accelerated over the last year or so. At times she has had significant behavior disturbances. Her quality of life has deteriorated dramatically. She has a lot of support and care from her husband and son , but they both describe substantial worsening.  There is also concern about deterioration in renal function, although the most recent creatinine that I find documented was normal at 1.1 (August 11).  Warfarin anticoagulation has been stopped.  Home hospice has been called in.  Her Medtronic Enrhythm dual chamber pacemaker was implanted in 2009 and is functioning normally. She is essentially pacemaker dependent due to severe sinus bradycardia/sinus arrest, but has spent most of the time in atrial fibrillation and has normal AV conduction.  She spent the last 3 months or so and uninterrupted atrial fibrillation, and it looks just today she converted to atrial paced, ventricular sensed rhythm. As before, she appears to be oblivious to the arrhythmia.  during atrial fibrillation there is adequate ventricular rate control. Battery voltage is 2.93V. Normal lead parameters.   she spoke a few words to me today and told me that my tie "was pretty".  Otherwise, she could not answer any of my questions and was very withdrawn , but calm.  Past  Medical History  Diagnosis Date  . Hyperlipidemia   . Chronic LBP   . Gout 01/2012    right index and middle fingers  . Dysrhythmia     atrial fib  . Dementia   . Scoliosis   . Hypertension     under control  . Full dentures   . Wears glasses   . Pacemaker     dr Antonios Ostrow[facility]  . Arthritis   . GERD (gastroesophageal reflux disease)   . Atrial fibrillation (HCC)   . Hiatal hernia   . Seizures (HCC) 2009    seizure x 1; none since pacemaker insertion  . Renal mass     Past Surgical History  Procedure Laterality Date  . Tonsillectomy    . Hernia repair    . Trigger finger release  04/24/2011    release A-1 pulley left index  . Carpal tunnel release  11/14/2009    right  . Appendectomy    . Abdominal hysterectomy    . Heel spur surgery      both feet  . Eye surgery      both cataracts  . Permanent pacemaker insertion  07/08/2008    Medtronic  . Shoulder arthroscopy  04/15/2006 - left    right:  10/18/2005, 09/24/2005  . Lumbar laminectomy/decompression microdiscectomy  02/06/2005; 10/21/2003    with fusions   . Back surgery  [id number]  . Cardioversion  08/08/2010    for atrial fib.  . Trigger finger release  08/10/2003  right thumb  . Wrist arthroscopy  07/04/2010    left wrist; also left CTR  . Spinal growth rods    . Knee arthroscopy      right  . Trigger finger release  01/21/2012    Procedure: RELEASE TRIGGER FINGER/A-1 PULLEY;  Surgeon: Nicki Reaper, MD;  Location: [facility];  Service: Orthopedics;  Laterality: Right;  right ring finger  . I&d extremity  02/05/2012    Procedure: IRRIGATION AND DEBRIDEMENT EXTREMITY;  Surgeon: Nicki Reaper, MD;  Location: [facility];  Service: Orthopedics;  Laterality: Right;  debridement right index and middle fingers  . Trigger finger release Left 03/17/2013    Procedure: RELEASE TRIGGER FINGER/A-1 PULLEY LEFT MIDDLE, RING AND SMALL FINGERS;  Surgeon: Nicki Reaper, MD;  Location: [facility];   Service: Orthopedics;  Laterality: Left;  . Nm myocar perf wall motion  11/24/2007    No ischemia     Current Outpatient Prescriptions  Medication Sig Dispense Refill  . aspirin EC 325 MG tablet Take 325 mg by mouth daily.    . furosemide (LASIX) 40 MG tablet TAKE 1 TABLET BY MOUTH DAILY AS NEEDED FOR LEG SWELLING 30 tablet 5  . levofloxacin (LEVAQUIN) 250 MG tablet TAKE 1 TABLET BY MOUTH TWICE A DAY FOR 7 DAYS  0  . ranitidine (ZANTAC) 150 MG tablet TAKE 1 TABLET BY MOUTH AT BEDTIME. 30 tablet 5   No current facility-administered medications for this visit.    Allergies:   Oxycodone hcl; Amiodarone; Benazepril; Prednisone; Aspirin; Codeine; and Sulfa antibiotics    Social History:  The patient  reports that she has never smoked. She has never used smokeless tobacco. She reports that she does not drink alcohol or use illicit drugs.   Family History:  The patient's family history includes Alzheimer's disease in her father; Heart failure in her mother; Stroke in her sister.    ROS:  Please see the history of present illness.    Otherwise, review of systems positive for none.   History obtained from family. All other systems are reviewed and negative.    PHYSICAL EXAM: VS:  BP 130/78 mmHg  Pulse 94  Ht 5\' 4"  (1.626 m) , BMI There is no weight on file to calculate BMI.  General: Alert, oriented x3, no distress Head: no evidence of trauma, PERRL, EOMI, no exophtalmos or lid lag, no myxedema, no xanthelasma; normal ears, nose and oropharynx Neck: normal jugular venous pulsations and no hepatojugular reflux; brisk carotid pulses without delay and no carotid bruits Chest: clear to auscultation, no signs of consolidation by percussion or palpation, normal fremitus, symmetrical and full respiratory excursions Cardiovascular: normal position and quality of the apical impulse, regular rhythm, normal first and second heart sounds, 2/6 early peaking aortic ejection murmur, no diastolic  murmurs, rubs or gallops Abdomen: no tenderness or distention, no masses by palpation, no abnormal pulsatility or arterial bruits, normal bowel sounds, no hepatosplenomegaly Extremities: no clubbing, cyanosis or edema; 2+ radial, ulnar and brachial pulses bilaterally; 2+ right femoral, posterior tibial and dorsalis pedis pulses; 2+ left femoral, posterior tibial and dorsalis pedis pulses; no subclavian or femoral bruits Neurological: grossly nonfocal Psych: euthymic mood, full affect   EKG:  EKG is ordered today. The ekg ordered today demonstrates  Atrial paced, ventricular sensed, pronounced tremor artifact makes it hard to evaluate repolarization, but the QT appears excessively prolonged at around 530 ms   Recent Labs: 05/02/2015: Hemoglobin 10.9*; Platelets 182  07/13/2015: ALT 10; BUN 30*; Creat 1.11*; Potassium 4.1; Sodium 143    Lipid Panel    Component Value Date/Time   CHOL  08/05/2010 0214    178        ATP III CLASSIFICATION:  <200     mg/dL   Desirable  295-284  mg/dL   Borderline High  >=132    mg/dL   High          TRIG 76 08/05/2010 0214   HDL 51 08/05/2010 0214   CHOLHDL 3.5 08/05/2010 0214   VLDL 15 08/05/2010 0214   LDLCALC * 08/05/2010 0214    112        Total Cholesterol/HDL:CHD Risk Coronary Heart Disease Risk Table                     Men   Women  1/2 Average Risk   3.4   3.3  Average Risk       5.0   4.4  2 X Average Risk   9.6   7.1  3 X Average Risk  23.4   11.0        Use the calculated Patient Ratio above and the CHD Risk Table to determine the patient's CHD Risk.        ATP III CLASSIFICATION (LDL):  <100     mg/dL   Optimal  440-102  mg/dL   Near or Above                    Optimal  130-159  mg/dL   Borderline  725-366  mg/dL   High  >440     mg/dL   Very High      Wt Readings from Last 3 Encounters:  08/24/15 120 lb (54.432 kg)  07/24/15 125 lb (56.7 kg)  05/02/15 124 lb (56.246 kg)     ASSESSMENT AND PLAN:  1.  Advanced  dementia. I agree that the focus should be on quality of life and comfort. Sotalol seems to be a dangerous medication for her symptoms her oral intake is poor and she is at risk for renal dysfunction. The QT appears excessively prolonged today. Will stop sotalol. It is possible that this will lead to some degree of rapid ventricular rate , but as long as this does not cause symptomatic problems would not add other medications. If tachycardia becomes symptomatic, would use a simple beta blocker such as metoprolol or atenolol.  2.  Sinus node dysfunction/sinus arrest. Although she spends most of the time in atrial fibrillation and has normal AV conduction , she has periods of cessation of atrial fibrillation when there is no sinus node activity and she is pacemaker dependent. Unsure how this will behave after discontinuation of sotalol. It is quite likely that she might go into permanent atrial fibrillation. I talked our options over with her husband and her son. The both think that she would not be well served by pacemaker generator change out. They would like to leave the current pacemaker in with the current settings until the battery runs out.  They would like to continue remote pacemaker monitoring.  We discussed end-of-life issues and palliative care approach today.   Current medicines are reviewed at length with the patient today.  The patient does not have concerns regarding medicines.  The following changes have been made:  Stop sotalol  Labs/ tests ordered today include:  No orders of the defined types were placed in this encounter.  Patient Instructions  Your physician has recommended you make the following change in your medication:   STOP SOTALOL  Remote monitoring is used to monitor your Pacemaker from home. This monitoring reduces the number of office visits required to check your device to one time per year. It allows [place] to monitor the functioning of your device to ensure it is  working properly. You are scheduled for a device check from home on January 21, 2016. You may send your transmission at any time that day. If you have a wireless device, the transmission will be sent automatically. After your physician reviews your transmission, you will receive a postcard with your next transmission date.  Dr. Royann Shivers recommends that you schedule a follow-up appointment in: ONE YEAR        SignedThurmon Fair, MD  10/21/2015 12:39 PM    Thurmon Fair, MD, [facility] [phone] office [phone] pager

## 2015-10-21 DIAGNOSIS — I455 Other specified heart block: Secondary | ICD-10-CM | POA: Insufficient documentation

## 2015-10-30 ENCOUNTER — Other Ambulatory Visit: Payer: Self-pay | Admitting: *Deleted

## 2015-10-30 LAB — CUP PACEART INCLINIC DEVICE CHECK
Brady Statistic AP VP Percent: 3.91 %
Brady Statistic AP VS Percent: 30.1 %
Brady Statistic AS VP Percent: 35.79 %
Brady Statistic RV Percent Paced: 39.7 %
Implantable Lead Implant Date: 20090807
Implantable Lead Location: 753860
Implantable Lead Model: 5076
Lead Channel Impedance Value: 488 Ohm
Lead Channel Sensing Intrinsic Amplitude: 1.907 mV
Lead Channel Sensing Intrinsic Amplitude: 7.846 mV
Lead Channel Setting Pacing Amplitude: 2 V
Lead Channel Setting Pacing Amplitude: 2.5 V
MDC IDC LEAD IMPLANT DT: 20090807
MDC IDC LEAD LOCATION: 753859
MDC IDC MSMT BATTERY VOLTAGE: 2.89 V
MDC IDC MSMT LEADCHNL RV IMPEDANCE VALUE: 672 Ohm
MDC IDC SESS DTM: 20161118205303
MDC IDC SET LEADCHNL RV PACING PULSEWIDTH: 0.4 ms
MDC IDC SET LEADCHNL RV SENSING SENSITIVITY: 0.9 mV
MDC IDC STAT BRADY AS VS PERCENT: 30.2 %
MDC IDC STAT BRADY RA PERCENT PACED: 34.01 %

## 2015-10-31 ENCOUNTER — Other Ambulatory Visit: Payer: Self-pay | Admitting: Family Medicine

## 2015-10-31 NOTE — Telephone Encounter (Signed)
Medication refilled per protocol. 

## 2015-11-01 ENCOUNTER — Other Ambulatory Visit: Payer: Self-pay | Admitting: Family Medicine

## 2015-11-01 MED ORDER — RISPERIDONE 0.5 MG PO TABS: ORAL_TABLET | ORAL | Status: AC

## 2015-11-02 ENCOUNTER — Encounter: Payer: Self-pay | Admitting: Cardiovascular Disease

## 2015-11-14 ENCOUNTER — Telehealth: Payer: Self-pay | Admitting: Family Medicine

## 2015-11-14 NOTE — Telephone Encounter (Signed)
Rec'd FMLA ppw from pt's son, Darlin CocoDennis Leisner. [place] to [place].

## 2015-11-15 NOTE — Telephone Encounter (Signed)
Received FMLA ppw on my desk from pt's son Darlin CocoDennis Lequire stating the FMLA is for him  Reason for FMLA is mother is bed ridden and states him and his brother help care for her needs but bathing, clothing, feeding, giving her medications, which cares for her at her home and will need their complete attention.  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 Pull leads out back and empty  I have routed the FMLA to Dr. Tanya NonesPickard  Pt is aware of form fee

## 2015-11-16 ENCOUNTER — Other Ambulatory Visit: Payer: Self-pay | Admitting: Family Medicine

## 2016-01-04 ENCOUNTER — Other Ambulatory Visit: Payer: Self-pay | Admitting: *Deleted

## 2016-01-04 NOTE — Telephone Encounter (Signed)
Hospice Nurse Nettie Elm called asking if you could order Morphine Liquid for this pt, states seems to be more in pain and feels like the tylenol is not helping her as much, also stated that pt is having increased difficulty swallowing  And more restless at night.   Please call family when script is ready for pick up  If any questions please call Nettie Elm at [phone]

## 2016-01-05 ENCOUNTER — Encounter: Payer: Self-pay | Admitting: *Deleted

## 2016-01-05 MED ORDER — HYDROMORPHONE HCL 2 MG PO TABS: 2.0000 mg | ORAL_TABLET | Freq: Three times a day (TID) | ORAL | Status: AC | PRN

## 2016-01-05 NOTE — Telephone Encounter (Signed)
Script printed provider signed and pt pickup

## 2016-01-05 NOTE — Telephone Encounter (Signed)
Per Dr. Tanya Nones send Dilaudid  dialy tablet every 8hrs as prn #20

## 2016-01-05 NOTE — Telephone Encounter (Signed)
This encounter was created in error - please disregard.

## 2016-01-05 NOTE — Telephone Encounter (Signed)
Morphine /mL, 1/2 - 1 tsp poq 6 hrs prn pain.  Warn family this may cause sedation and confusion

## 2016-01-07 ENCOUNTER — Other Ambulatory Visit: Payer: Self-pay | Admitting: Cardiovascular Disease

## 2016-01-08 ENCOUNTER — Other Ambulatory Visit: Payer: Self-pay | Admitting: Family Medicine

## 2016-01-08 MED ORDER — ACETAMINOPHEN 325 MG RE SUPP: 325.0000 mg | RECTAL | Status: AC | PRN

## 2016-01-31 DEATH — deceased
